# Patient Record
Sex: Male | Born: 1940 | Race: Black or African American | Hispanic: No | Marital: Married | Smoking: Former smoker
Health system: Southern US, Community
[De-identification: ages and names within clinical notes are randomized; demographics above are authoritative.]

## PROBLEM LIST (undated history)

## (undated) DIAGNOSIS — I499 Cardiac arrhythmia, unspecified: Secondary | ICD-10-CM

## (undated) DIAGNOSIS — E119 Type 2 diabetes mellitus without complications: Secondary | ICD-10-CM

## (undated) DIAGNOSIS — K219 Gastro-esophageal reflux disease without esophagitis: Secondary | ICD-10-CM

## (undated) DIAGNOSIS — M1990 Unspecified osteoarthritis, unspecified site: Secondary | ICD-10-CM

## (undated) DIAGNOSIS — R519 Headache, unspecified: Secondary | ICD-10-CM

## (undated) DIAGNOSIS — I10 Essential (primary) hypertension: Secondary | ICD-10-CM

## (undated) DIAGNOSIS — G4730 Sleep apnea, unspecified: Secondary | ICD-10-CM

## (undated) DIAGNOSIS — R51 Headache: Secondary | ICD-10-CM

## (undated) DIAGNOSIS — G893 Neoplasm related pain (acute) (chronic): Secondary | ICD-10-CM

## (undated) HISTORY — DX: Neoplasm related pain (acute) (chronic): G89.3

## (undated) HISTORY — PX: PENILE PROSTHESIS IMPLANT: SHX240

## (undated) HISTORY — PX: TRANSURETHRAL RESECTION OF PROSTATE: SHX73

## (undated) HISTORY — PX: HERNIA REPAIR: SHX51

---

## 1967-06-12 DIAGNOSIS — Z9189 Other specified personal risk factors, not elsewhere classified: Secondary | ICD-10-CM | POA: Insufficient documentation

## 1972-06-11 DIAGNOSIS — N411 Chronic prostatitis: Secondary | ICD-10-CM | POA: Insufficient documentation

## 1977-06-11 DIAGNOSIS — M545 Low back pain, unspecified: Secondary | ICD-10-CM | POA: Insufficient documentation

## 1999-06-12 DIAGNOSIS — N401 Enlarged prostate with lower urinary tract symptoms: Secondary | ICD-10-CM | POA: Insufficient documentation

## 2005-09-14 ENCOUNTER — Emergency Department: Payer: Self-pay | Admitting: Emergency Medicine

## 2012-01-14 DIAGNOSIS — N400 Enlarged prostate without lower urinary tract symptoms: Secondary | ICD-10-CM | POA: Insufficient documentation

## 2012-01-14 DIAGNOSIS — N411 Chronic prostatitis: Secondary | ICD-10-CM | POA: Insufficient documentation

## 2012-11-23 ENCOUNTER — Emergency Department: Payer: Self-pay | Admitting: Emergency Medicine

## 2013-01-15 DIAGNOSIS — N529 Male erectile dysfunction, unspecified: Secondary | ICD-10-CM | POA: Insufficient documentation

## 2014-03-17 DIAGNOSIS — R002 Palpitations: Secondary | ICD-10-CM | POA: Insufficient documentation

## 2014-03-17 DIAGNOSIS — I10 Essential (primary) hypertension: Secondary | ICD-10-CM | POA: Insufficient documentation

## 2014-03-17 DIAGNOSIS — G4730 Sleep apnea, unspecified: Secondary | ICD-10-CM | POA: Insufficient documentation

## 2015-01-26 DIAGNOSIS — R519 Headache, unspecified: Secondary | ICD-10-CM | POA: Insufficient documentation

## 2015-01-26 DIAGNOSIS — R51 Headache: Secondary | ICD-10-CM

## 2015-02-28 ENCOUNTER — Encounter
Admission: RE | Admit: 2015-02-28 | Discharge: 2015-02-28 | Disposition: A | Discharge status: Home / Self Care | Payer: Medicare Other | Source: Ambulatory Visit | Attending: Otolaryngology | Admitting: Otolaryngology

## 2015-02-28 DIAGNOSIS — Z79899 Other long term (current) drug therapy: Secondary | ICD-10-CM | POA: Diagnosis not present

## 2015-02-28 DIAGNOSIS — Z882 Allergy status to sulfonamides status: Secondary | ICD-10-CM | POA: Diagnosis not present

## 2015-02-28 DIAGNOSIS — R011 Cardiac murmur, unspecified: Secondary | ICD-10-CM | POA: Diagnosis not present

## 2015-02-28 DIAGNOSIS — Z7951 Long term (current) use of inhaled steroids: Secondary | ICD-10-CM | POA: Diagnosis not present

## 2015-02-28 DIAGNOSIS — Z87891 Personal history of nicotine dependence: Secondary | ICD-10-CM | POA: Diagnosis not present

## 2015-02-28 DIAGNOSIS — K219 Gastro-esophageal reflux disease without esophagitis: Secondary | ICD-10-CM | POA: Diagnosis not present

## 2015-02-28 DIAGNOSIS — G4730 Sleep apnea, unspecified: Secondary | ICD-10-CM | POA: Diagnosis not present

## 2015-02-28 DIAGNOSIS — Z881 Allergy status to other antibiotic agents status: Secondary | ICD-10-CM | POA: Diagnosis not present

## 2015-02-28 DIAGNOSIS — E119 Type 2 diabetes mellitus without complications: Secondary | ICD-10-CM | POA: Diagnosis not present

## 2015-02-28 DIAGNOSIS — R51 Headache: Secondary | ICD-10-CM | POA: Diagnosis present

## 2015-02-28 DIAGNOSIS — I10 Essential (primary) hypertension: Secondary | ICD-10-CM | POA: Diagnosis not present

## 2015-02-28 DIAGNOSIS — Z880 Allergy status to penicillin: Secondary | ICD-10-CM | POA: Diagnosis not present

## 2015-02-28 DIAGNOSIS — M1990 Unspecified osteoarthritis, unspecified site: Secondary | ICD-10-CM | POA: Diagnosis not present

## 2015-02-28 DIAGNOSIS — M316 Other giant cell arteritis: Secondary | ICD-10-CM | POA: Diagnosis not present

## 2015-02-28 HISTORY — DX: Sleep apnea, unspecified: G47.30

## 2015-02-28 HISTORY — DX: Unspecified osteoarthritis, unspecified site: M19.90

## 2015-02-28 HISTORY — DX: Headache: R51

## 2015-02-28 HISTORY — DX: Essential (primary) hypertension: I10

## 2015-02-28 HISTORY — DX: Gastro-esophageal reflux disease without esophagitis: K21.9

## 2015-02-28 HISTORY — DX: Headache, unspecified: R51.9

## 2015-02-28 HISTORY — DX: Type 2 diabetes mellitus without complications: E11.9

## 2015-02-28 LAB — BASIC METABOLIC PANEL
Anion gap: 8 (ref 5–15)
BUN: 18 mg/dL (ref 6–20)
CO2: 27 mmol/L (ref 22–32)
Calcium: 10.3 mg/dL (ref 8.9–10.3)
Chloride: 103 mmol/L (ref 101–111)
Creatinine, Ser: 1.2 mg/dL (ref 0.61–1.24)
GFR calc Af Amer: >60 mL/min (ref >60)
GFR calc non Af Amer: 59 mL/min — ABNORMAL LOW (ref >60)
Glucose, Bld: 111 mg/dL — ABNORMAL HIGH (ref 65–99)
Potassium: 3.8 mmol/L (ref 3.5–5.1)
Sodium: 138 mmol/L (ref 135–145)

## 2015-02-28 NOTE — Patient Instructions (Signed)
  Your procedure is scheduled on: March 02, 2015 (Wednesday) Report to Day Surgery.[facility]) To find out your arrival time please call [phone] between 1PM - 3PM on March 01, 2015 (Tuesday).  Remember: Instructions that are not followed completely may result in serious medical risk, up to and including death, or upon the discretion of your surgeon and anesthesiologist your surgery may need to be rescheduled.    __x__ 1. Do not eat food or drink liquids after midnight. No gum chewing or hard candies.     ____ 2. No Alcohol for 24 hours before or after surgery.   ____ 3. Bring all medications with you on the day of surgery if instructed.    __x__ 4. Notify your doctor if there is any change in your medical condition     (cold, fever, infections).     Do not wear jewelry, make-up, hairpins, clips or nail polish.  Do not wear lotions, powders, or perfumes. You may wear deodorant.  Do not shave 48 hours prior to surgery. Men may shave face and neck.  Do not bring valuables to the hospital.    [facility] is not responsible for any belongings or valuables.               Contacts, dentures or bridgework may not be worn into surgery.  Leave your suitcase in the car. After surgery it may be brought to your room.  For patients admitted to the hospital, discharge time is determined by your                treatment team.   Patients discharged the day of surgery will not be allowed to drive home.   Please read over the following fact sheets that you were given:     __x__ Take these medicines the morning of surgery with A SIP OF WATER:    1. Omeprazole (Omeprazole at bedtime on 9-20)    2. Gabapentin  ____ Fleet Enema (as directed)   ____ Use CHG Soap as directed  ____ Use inhalers on the day of surgery  _x___ Stop metformin 2 days prior to surgery (STOP METFORMIN ON September 19, TODAY)    ____ Take 1/2 of usual insulin dose the night before surgery and none on the  morning of surgery.   _x___ Stop Coumadin/Plavix/aspirin on (PATIENT STOPPED ASPIRIN ONE WEEK AGO)  _x__ Stop Anti-inflammatories on (TYLENOL OK TO TAKE FOR PAIN)   ____ Stop supplements until after surgery.    _x___ Bring C-Pap to the hospital.

## 2015-03-02 ENCOUNTER — Ambulatory Visit: Payer: Medicare Other | Admitting: Certified Registered Nurse Anesthetist

## 2015-03-02 ENCOUNTER — Encounter: Payer: Self-pay | Admitting: *Deleted

## 2015-03-02 ENCOUNTER — Ambulatory Visit
Admission: RE | Admit: 2015-03-02 | Discharge: 2015-03-02 | Disposition: A | Discharge status: Home / Self Care | Payer: Medicare Other | Source: Ambulatory Visit | Attending: Otolaryngology | Admitting: Otolaryngology

## 2015-03-02 ENCOUNTER — Encounter
Admission: RE | Disposition: A | Discharge status: Home / Self Care | Payer: Self-pay | Source: Ambulatory Visit | Attending: Otolaryngology

## 2015-03-02 DIAGNOSIS — I10 Essential (primary) hypertension: Secondary | ICD-10-CM | POA: Insufficient documentation

## 2015-03-02 DIAGNOSIS — Z880 Allergy status to penicillin: Secondary | ICD-10-CM | POA: Insufficient documentation

## 2015-03-02 DIAGNOSIS — Z87891 Personal history of nicotine dependence: Secondary | ICD-10-CM | POA: Insufficient documentation

## 2015-03-02 DIAGNOSIS — M316 Other giant cell arteritis: Secondary | ICD-10-CM | POA: Insufficient documentation

## 2015-03-02 DIAGNOSIS — Z881 Allergy status to other antibiotic agents status: Secondary | ICD-10-CM | POA: Insufficient documentation

## 2015-03-02 DIAGNOSIS — E119 Type 2 diabetes mellitus without complications: Secondary | ICD-10-CM | POA: Insufficient documentation

## 2015-03-02 DIAGNOSIS — Z7951 Long term (current) use of inhaled steroids: Secondary | ICD-10-CM | POA: Insufficient documentation

## 2015-03-02 DIAGNOSIS — K219 Gastro-esophageal reflux disease without esophagitis: Secondary | ICD-10-CM | POA: Insufficient documentation

## 2015-03-02 DIAGNOSIS — G4730 Sleep apnea, unspecified: Secondary | ICD-10-CM | POA: Insufficient documentation

## 2015-03-02 DIAGNOSIS — Z882 Allergy status to sulfonamides status: Secondary | ICD-10-CM | POA: Insufficient documentation

## 2015-03-02 DIAGNOSIS — M1990 Unspecified osteoarthritis, unspecified site: Secondary | ICD-10-CM | POA: Insufficient documentation

## 2015-03-02 DIAGNOSIS — R011 Cardiac murmur, unspecified: Secondary | ICD-10-CM | POA: Insufficient documentation

## 2015-03-02 DIAGNOSIS — Z79899 Other long term (current) drug therapy: Secondary | ICD-10-CM | POA: Insufficient documentation

## 2015-03-02 HISTORY — PX: ARTERY BIOPSY: SHX891

## 2015-03-02 LAB — GLUCOSE, CAPILLARY: Glucose-Capillary: 111 mg/dL — ABNORMAL HIGH (ref 65–99)

## 2015-03-02 SURGERY — BIOPSY TEMPORAL ARTERY
Anesthesia: General | Laterality: Left | Wound class: Clean

## 2015-03-02 MED ORDER — PROPOFOL INFUSION 10 MG/ML OPTIME
INTRAVENOUS | Status: DC | PRN
  Administered 2015-03-02: 25 ug/kg/min via INTRAVENOUS

## 2015-03-02 MED ORDER — LIDOCAINE HCL (CARDIAC) 20 MG/ML IV SOLN
INTRAVENOUS | Status: DC | PRN
  Administered 2015-03-02: 100 mg via INTRAVENOUS

## 2015-03-02 MED ORDER — FAMOTIDINE 20 MG PO TABS: 20.0000 mg | ORAL_TABLET | Freq: Once | ORAL | Status: DC

## 2015-03-02 MED ORDER — LIDOCAINE-EPINEPHRINE 1 %-1:100000 IJ SOLN
INTRAMUSCULAR | Status: AC
  Filled 2015-03-02: qty 1

## 2015-03-02 MED ORDER — SODIUM CHLORIDE 0.9 % IV SOLN
INTRAVENOUS | Status: DC
  Administered 2015-03-02: 08:00:00 via INTRAVENOUS

## 2015-03-02 MED ORDER — BACITRACIN ZINC 500 UNIT/GM EX OINT
TOPICAL_OINTMENT | CUTANEOUS | Status: AC
  Filled 2015-03-02: qty 28.35

## 2015-03-02 MED ORDER — ONDANSETRON HCL 4 MG/2ML IJ SOLN: 4.0000 mg | Freq: Once | INTRAMUSCULAR | Status: DC | PRN

## 2015-03-02 MED ORDER — BACITRACIN 500 UNIT/GM EX OINT
TOPICAL_OINTMENT | CUTANEOUS | Status: DC | PRN
Start: 2015-03-02 — End: 2015-03-02
  Administered 2015-03-02: 1 via TOPICAL

## 2015-03-02 MED ORDER — FENTANYL CITRATE (PF) 100 MCG/2ML IJ SOLN: 25.0000 ug | INTRAMUSCULAR | Status: DC | PRN

## 2015-03-02 MED ORDER — FENTANYL CITRATE (PF) 100 MCG/2ML IJ SOLN
INTRAMUSCULAR | Status: DC | PRN
  Administered 2015-03-02: 50 ug via INTRAVENOUS

## 2015-03-02 MED ORDER — LACTATED RINGERS IV SOLN: INTRAVENOUS | Status: DC

## 2015-03-02 MED ORDER — LIDOCAINE-EPINEPHRINE 1 %-1:100000 IJ SOLN
INTRAMUSCULAR | Status: DC | PRN
  Administered 2015-03-02: 1 mL

## 2015-03-02 MED ORDER — MIDAZOLAM HCL 2 MG/2ML IJ SOLN
INTRAMUSCULAR | Status: DC | PRN
  Administered 2015-03-02: 2 mg via INTRAVENOUS

## 2015-03-02 SURGICAL SUPPLY — 24 items
BLADE SURG 15 STRL LF DISP TIS (BLADE) ×1 IMPLANT
BLADE SURG 15 STRL SS (BLADE) ×2
CANISTER SUCT 1200ML W/VALVE (MISCELLANEOUS) ×3 IMPLANT
CORD BIP STRL DISP 12FT (MISCELLANEOUS) ×3 IMPLANT
ELECT CAUTERY BLADE TIP 2.5 (TIP)
ELECT CAUTERY NEEDLE TIP 1.0 (MISCELLANEOUS) ×3
ELECTRODE CAUTERY BLDE TIP 2.5 (TIP) IMPLANT
ELECTRODE CAUTERY NEDL TIP 1.0 (MISCELLANEOUS) ×1 IMPLANT
FORCEPS JEWEL BIP 4-3/4 STR (INSTRUMENTS) ×3 IMPLANT
GLOVE BIO SURGEON STRL SZ7.5 (GLOVE) ×15 IMPLANT
GOWN STRL REUS W/ TWL LRG LVL3 (GOWN DISPOSABLE) ×3 IMPLANT
GOWN STRL REUS W/TWL LRG LVL3 (GOWN DISPOSABLE) ×6
LABEL OR SOLS (LABEL) ×3 IMPLANT
NS IRRIG 500ML POUR BTL (IV SOLUTION) ×3 IMPLANT
PACK HEAD/NECK (MISCELLANEOUS) ×3 IMPLANT
PAD GROUND ADULT SPLIT (MISCELLANEOUS) ×3 IMPLANT
SPONGE KITTNER 5P (MISCELLANEOUS) ×3 IMPLANT
SUCTION FRAZIER TIP 10 FR DISP (SUCTIONS) ×3 IMPLANT
SUT PLAIN GUT (SUTURE) IMPLANT
SUT SILK 2 0 (SUTURE) ×2
SUT SILK 2-0 18XBRD TIE 12 (SUTURE) ×1 IMPLANT
SUT VIC AB 5 0 P2 18 (SUTURE) IMPLANT
SUT VICRYL+ 4-0 18IN PS-4 (SUTURE) ×3 IMPLANT
SYR 3ML LL SCALE MARK (SYRINGE) IMPLANT

## 2015-03-02 NOTE — Anesthesia Preprocedure Evaluation (Signed)
Anesthesia Evaluation  Patient identified by MRN, date of birth, ID band Patient awake    Reviewed: Allergy & Precautions, NPO status , Patient's Chart, lab work & pertinent test results  History of Anesthesia Complications Negative for: history of anesthetic complications  Airway Mallampati: II  TM Distance: >3 FB Neck ROM: Full    Dental  (+) Teeth Intact   Pulmonary sleep apnea (uses about 1/2 the night) , former smoker (quit x 6 yrs),           Cardiovascular hypertension, Pt. on medications + Valvular Problems/Murmurs (hx of murmur)      Neuro/Psych    GI/Hepatic Neg liver ROS, GERD  Medicated,  Endo/Other  diabetes, Type 2, Oral Hypoglycemic Agents  Renal/GU negative Renal ROS     Musculoskeletal  (+) Arthritis , Osteoarthritis,    Abdominal   Peds  Hematology negative hematology ROS (+)   Anesthesia Other Findings   Reproductive/Obstetrics                             Anesthesia Physical Anesthesia Plan  ASA: III  Anesthesia Plan: General   Post-op Pain Management:    Induction: Intravenous  Airway Management Planned: Nasal Cannula  Additional Equipment:   Intra-op Plan:   Post-operative Plan:   Informed Consent: I have reviewed the patients History and Physical, chart, labs and discussed the procedure including the risks, benefits and alternatives for the proposed anesthesia with the patient or authorized representative who has indicated his/her understanding and acceptance.     Plan Discussed with:   Anesthesia Plan Comments:         Anesthesia Quick Evaluation

## 2015-03-02 NOTE — H&P (Signed)
History and physical reviewed and will be scanned in later. No change in medical status reported by the patient or family, appears stable for surgery. All questions regarding the procedure answered, and patient (or family if a child) expressed understanding of the procedure.  Elandra Powell S @TODAY@ 

## 2015-03-02 NOTE — Transfer of Care (Signed)
Immediate Anesthesia Transfer of Care Note  Patient: Ricky Riley  Procedure(s) Performed: Procedure(s): BIOPSY TEMPORAL ARTERY (Left)  Patient Location: PACU  Anesthesia Type:MAC  Level of Consciousness: awake, alert  and oriented  Airway & Oxygen Therapy: Patient Spontanous Breathing and Patient connected to face mask oxygen  Post-op Assessment: Report given to RN and Post -op Vital signs reviewed and stable  Post vital signs: Reviewed and stable  Last Vitals:  Filed Vitals:   03/02/15 1011  BP:   Pulse:   Temp: 37.2 C  Resp:    BP 155/71 HR 46 Resp 11 Complications: No apparent anesthesia complications

## 2015-03-02 NOTE — Anesthesia Procedure Notes (Signed)
Procedure Name: MAC Performed by: Demetrius Charity Pre-anesthesia Checklist: Patient identified, Emergency Drugs available, Patient being monitored, Suction available and Timeout performed Oxygen Delivery Method: Simple face mask

## 2015-03-02 NOTE — Anesthesia Postprocedure Evaluation (Signed)
  Anesthesia Post-op Note  Patient: Ricky Riley  Procedure(s) Performed: Procedure(s): BIOPSY TEMPORAL ARTERY (Left)  Anesthesia type:General  Patient location: PACU  Post pain: Pain level controlled  Post assessment: Post-op Vital signs reviewed, Patient's Cardiovascular Status Stable, Respiratory Function Stable, Patent Airway and No signs of Nausea or vomiting  Post vital signs: Reviewed and stable  Last Vitals:  Filed Vitals:   03/02/15 1040  BP:   Pulse:   Temp: 36.3 C  Resp:     Level of consciousness: awake, alert  and patient cooperative  Complications: No apparent anesthesia complications

## 2015-03-02 NOTE — Discharge Instructions (Signed)
AMBULATORY SURGERY  °DISCHARGE INSTRUCTIONS ° ° °1) The drugs that you were given will stay in your system until tomorrow so for the next 24 hours you should not: ° °A) Drive an automobile °B) Make any legal decisions °C) Drink any alcoholic beverage ° ° °2) You may resume regular meals tomorrow.  Today it is better to start with liquids and gradually work up to solid foods. ° °You may eat anything you prefer, but it is better to start with liquids, then soup and crackers, and gradually work up to solid foods. ° ° °3) Please notify your doctor immediately if you have any unusual bleeding, trouble breathing, redness and pain at the surgery site, drainage, fever, or pain not relieved by medication. ° ° ° °4) Additional Instructions: ° ° ° ° ° ° ° °Please contact your physician with any problems or Same Day Surgery at [phone], Monday through Friday 6 am to 4 pm, or [place] [facility] [place] Main number at [phone]. °

## 2015-03-02 NOTE — Op Note (Signed)
03/02/2015  10:03 AM    Ricky Riley  [id number]   Pre-Op Diagnosis:  HEADACHE, DIZZINESS, POSSIBLE TEMPORAL ARTERITIS  Post-op Diagnosis: HEADACHE, DIZZINESS, POSSIBLE TEMPORAL ARTERITIS  Procedure:   Left temporal artery biopsy with intermediate closure (2.5 cm)   Surgeon:  Riley Nearing  Anesthesia:  Local with IV sedation and monitored anesthesia care  EBL:  Minimal  Complications:  None  Findings: A 2 cm segment of thickened temporal artery was harvested  Procedure: After the patient was identified in holding and the procedure was reviewed.  The patient was taken to the operating room and with the patient in a comfortable supine position,   sedation with monitored anesthesia care was induced without difficulty.  A proper time-out was performed, confirming the operative site and procedure.  The left temporal area was prepped and draped in the usual sterile fashion. A Doppler was then used to identify the course of the left temporal artery, and marked with a pen. The skin was then injected with 1% lidocaine with epinephrine 1 200,000. A 15 blade was used to incise the skin and the incision carried down through to the subcutaneous fatty tissues. The temporal artery was then dissected out with hemostat. Scissors were used to divide soft tissue attachments. A segment of at least 2 cm of the temporal artery was dissected out and clamped with a hemostat proximally and distally. The artery was divided with a 15 blade and dissected away from the underlying temporalis muscle and sent as a specimen. The cut ends of the artery were then suture-ligated with 2-0 silk suture. There was some minimal oozing in the wound which was addressed with the bipolar cautery. The wound was then closed with 4-0 Vicryl suture followed by 5-0 fast absorbing gut suture for the skin. Bacitracin ointment was applied.  The patient was then returned to the anesthesiologist in good condition for awakening. The  patient was awakened and taken to the recovery room in good condition.   Disposition:   PACU then discharge home.  Plan: Discharge home. Ice to areas needed. Patient may take Tylenol as needed for pain. Bacitracin ointment to wound twice daily for 7 days.  Riley Nearing 03/02/2015 10:03 AM

## 2015-03-03 LAB — SURGICAL PATHOLOGY

## 2015-11-03 ENCOUNTER — Other Ambulatory Visit
Admission: RE | Admit: 2015-11-03 | Discharge: 2015-11-03 | Disposition: A | Discharge status: Home / Self Care | Payer: Medicare Other | Source: Ambulatory Visit | Attending: Rheumatology | Admitting: Rheumatology

## 2015-11-03 DIAGNOSIS — M1711 Unilateral primary osteoarthritis, right knee: Secondary | ICD-10-CM | POA: Insufficient documentation

## 2015-11-03 LAB — SYNOVIAL CELL COUNT + DIFF, W/ CRYSTALS
Crystals, Fluid: NONE SEEN
Lymphocytes-Synovial Fld: 39 %
Monocyte-Macrophage-Synovial Fluid: 57 %
Neutrophil, Synovial: 4 %
WBC, Synovial: 642 /mm3 — ABNORMAL HIGH (ref 0–200)

## 2016-01-23 DIAGNOSIS — Z7952 Long term (current) use of systemic steroids: Secondary | ICD-10-CM | POA: Insufficient documentation

## 2016-01-23 DIAGNOSIS — R05 Cough: Secondary | ICD-10-CM | POA: Insufficient documentation

## 2016-01-23 DIAGNOSIS — R053 Chronic cough: Secondary | ICD-10-CM | POA: Insufficient documentation

## 2016-03-12 ENCOUNTER — Other Ambulatory Visit: Payer: Self-pay | Admitting: Infectious Diseases

## 2016-03-12 DIAGNOSIS — R05 Cough: Secondary | ICD-10-CM

## 2016-03-12 DIAGNOSIS — Z7952 Long term (current) use of systemic steroids: Secondary | ICD-10-CM

## 2016-03-12 DIAGNOSIS — R059 Cough, unspecified: Secondary | ICD-10-CM

## 2016-03-20 ENCOUNTER — Ambulatory Visit: Payer: Medicare Other

## 2016-03-21 ENCOUNTER — Ambulatory Visit
Admission: RE | Admit: 2016-03-21 | Discharge: 2016-03-21 | Disposition: A | Discharge status: Home / Self Care | Payer: Medicare Other | Source: Ambulatory Visit | Attending: Infectious Diseases | Admitting: Infectious Diseases

## 2016-03-21 DIAGNOSIS — R918 Other nonspecific abnormal finding of lung field: Secondary | ICD-10-CM | POA: Insufficient documentation

## 2016-03-21 DIAGNOSIS — R938 Abnormal findings on diagnostic imaging of other specified body structures: Secondary | ICD-10-CM | POA: Diagnosis not present

## 2016-03-21 DIAGNOSIS — R9389 Abnormal findings on diagnostic imaging of other specified body structures: Secondary | ICD-10-CM | POA: Insufficient documentation

## 2016-03-21 DIAGNOSIS — R05 Cough: Secondary | ICD-10-CM | POA: Diagnosis present

## 2016-03-21 DIAGNOSIS — I700 Atherosclerosis of aorta: Secondary | ICD-10-CM | POA: Diagnosis not present

## 2016-03-21 DIAGNOSIS — E041 Nontoxic single thyroid nodule: Secondary | ICD-10-CM | POA: Diagnosis not present

## 2016-03-21 DIAGNOSIS — Z7952 Long term (current) use of systemic steroids: Secondary | ICD-10-CM | POA: Diagnosis present

## 2016-03-21 DIAGNOSIS — R059 Cough, unspecified: Secondary | ICD-10-CM

## 2016-03-29 ENCOUNTER — Other Ambulatory Visit: Payer: Self-pay | Admitting: Otolaryngology

## 2016-03-29 DIAGNOSIS — E079 Disorder of thyroid, unspecified: Secondary | ICD-10-CM

## 2016-03-30 DIAGNOSIS — M1711 Unilateral primary osteoarthritis, right knee: Secondary | ICD-10-CM | POA: Insufficient documentation

## 2016-04-05 ENCOUNTER — Ambulatory Visit
Admission: RE | Admit: 2016-04-05 | Discharge: 2016-04-05 | Disposition: A | Discharge status: Home / Self Care | Payer: Medicare Other | Source: Ambulatory Visit | Attending: Otolaryngology | Admitting: Otolaryngology

## 2016-04-05 DIAGNOSIS — E041 Nontoxic single thyroid nodule: Secondary | ICD-10-CM | POA: Diagnosis not present

## 2016-04-05 DIAGNOSIS — E079 Disorder of thyroid, unspecified: Secondary | ICD-10-CM

## 2016-04-05 NOTE — Progress Notes (Signed)
Patient tolerated procedure well. Bandage applied to biopsy site. Patient VS WNL. Patient ambulated with this RN to medical mall entrance for discharge to home.

## 2016-04-06 LAB — CYTOLOGY - NON PAP

## 2016-04-20 DIAGNOSIS — E041 Nontoxic single thyroid nodule: Secondary | ICD-10-CM | POA: Insufficient documentation

## 2016-08-20 NOTE — Addendum Note (Signed)
Encounter addended by: Vernie Murders, RT on: 08/20/2016 11:03 AM<BR>    Actions taken: Imaging Exam ended, Charge Capture section accepted

## 2016-12-31 DIAGNOSIS — R252 Cramp and spasm: Secondary | ICD-10-CM | POA: Insufficient documentation

## 2017-01-11 DIAGNOSIS — M25511 Pain in right shoulder: Secondary | ICD-10-CM | POA: Insufficient documentation

## 2017-02-05 DIAGNOSIS — R0789 Other chest pain: Secondary | ICD-10-CM | POA: Insufficient documentation

## 2017-03-22 DIAGNOSIS — R0602 Shortness of breath: Secondary | ICD-10-CM | POA: Insufficient documentation

## 2017-04-05 ENCOUNTER — Emergency Department
Admission: EM | Admit: 2017-04-05 | Discharge: 2017-04-05 | Disposition: A | Discharge status: Home / Self Care | Payer: Medicare Other | Attending: Emergency Medicine | Admitting: Emergency Medicine

## 2017-04-05 ENCOUNTER — Emergency Department: Payer: Medicare Other

## 2017-04-05 DIAGNOSIS — Y929 Unspecified place or not applicable: Secondary | ICD-10-CM | POA: Insufficient documentation

## 2017-04-05 DIAGNOSIS — Z203 Contact with and (suspected) exposure to rabies: Secondary | ICD-10-CM | POA: Insufficient documentation

## 2017-04-05 DIAGNOSIS — W540XXA Bitten by dog, initial encounter: Secondary | ICD-10-CM | POA: Diagnosis not present

## 2017-04-05 DIAGNOSIS — Y999 Unspecified external cause status: Secondary | ICD-10-CM | POA: Insufficient documentation

## 2017-04-05 DIAGNOSIS — Z7984 Long term (current) use of oral hypoglycemic drugs: Secondary | ICD-10-CM | POA: Insufficient documentation

## 2017-04-05 DIAGNOSIS — Z87891 Personal history of nicotine dependence: Secondary | ICD-10-CM | POA: Insufficient documentation

## 2017-04-05 DIAGNOSIS — Y939 Activity, unspecified: Secondary | ICD-10-CM | POA: Insufficient documentation

## 2017-04-05 DIAGNOSIS — E119 Type 2 diabetes mellitus without complications: Secondary | ICD-10-CM | POA: Diagnosis not present

## 2017-04-05 DIAGNOSIS — Z79899 Other long term (current) drug therapy: Secondary | ICD-10-CM | POA: Diagnosis not present

## 2017-04-05 DIAGNOSIS — Z2914 Encounter for prophylactic rabies immune globin: Secondary | ICD-10-CM | POA: Diagnosis not present

## 2017-04-05 DIAGNOSIS — Z23 Encounter for immunization: Secondary | ICD-10-CM | POA: Diagnosis not present

## 2017-04-05 DIAGNOSIS — S6992XA Unspecified injury of left wrist, hand and finger(s), initial encounter: Secondary | ICD-10-CM | POA: Diagnosis present

## 2017-04-05 DIAGNOSIS — S61452A Open bite of left hand, initial encounter: Secondary | ICD-10-CM | POA: Insufficient documentation

## 2017-04-05 DIAGNOSIS — I10 Essential (primary) hypertension: Secondary | ICD-10-CM | POA: Diagnosis not present

## 2017-04-05 DIAGNOSIS — Z7982 Long term (current) use of aspirin: Secondary | ICD-10-CM | POA: Insufficient documentation

## 2017-04-05 MED ORDER — DOXYCYCLINE HYCLATE 100 MG PO TABS: 100.0000 mg | ORAL_TABLET | Freq: Two times a day (BID) | ORAL | 0 refills | Status: DC

## 2017-04-05 MED ORDER — RABIES IMMUNE GLOBULIN 150 UNIT/ML IM INJ
20.0000 [IU]/kg | INJECTION | Freq: Once | INTRAMUSCULAR | Status: AC
  Administered 2017-04-05: 1800 [IU] via INTRAMUSCULAR
  Filled 2017-04-05: qty 12

## 2017-04-05 MED ORDER — RABIES VACCINE, PCEC IM SUSR
1.0000 mL | Freq: Once | INTRAMUSCULAR | Status: AC
  Administered 2017-04-05: 1 mL via INTRAMUSCULAR
  Filled 2017-04-05: qty 1

## 2017-04-05 MED ORDER — DOXYCYCLINE HYCLATE 100 MG PO TABS
100.0000 mg | ORAL_TABLET | Freq: Once | ORAL | Status: AC
  Administered 2017-04-05: 100 mg via ORAL
  Filled 2017-04-05: qty 1

## 2017-04-05 MED ORDER — TETANUS-DIPHTH-ACELL PERTUSSIS 5-2.5-18.5 LF-MCG/0.5 IM SUSP
0.5000 mL | Freq: Once | INTRAMUSCULAR | Status: AC
  Administered 2017-04-05: 0.5 mL via INTRAMUSCULAR
  Filled 2017-04-05: qty 0.5

## 2017-04-05 NOTE — ED Provider Notes (Signed)
[facility] Emergency Department Provider Note  ____________________________________________  Time seen: Approximately 9:59 PM  I have reviewed the triage vital signs and the nursing notes.   HISTORY  Chief Complaint Animal Bite    HPI Ricky Riley is a 76 y.o. male who presents the emergency with a complaint of dog bite to his left hand yesterday.  Patient reports that he has been minding his own dog, yesterday evening.  Initially, patient cleaned out the wound with hydrogen peroxide and soap and water.  Patient reports that injury was minimal.  Today he became concerned and sought care to discuss options of treatment.  Patient reports that the dog is kept in a cage on the back lot and was acting its normal "ornery" self.  Patient denies any symptoms in the interval consistent with rabies.  Patient denies any gross erythema or edema surrounding dog bite.  No other injury or complaint at this time.  Patient is allergic to Cipro, penicillins, sulfa antibiotics.  Patient has a history of diabetes, hypertension, sleep apnea, osteoarthritis, benign prostatic hypertrophy.  She denies any complaints with chronic medical complaints at this time.   Past Medical History:  Diagnosis Date  . Arthritis   . Diabetes mellitus without complication ([place])   . GERD (gastroesophageal reflux disease)   . Headache   . Hypertension   . Sleep apnea     There are no active problems to display for this patient.   Past Surgical History:  Procedure Laterality Date  . ARTERY BIOPSY Left 03/02/2015   Procedure: BIOPSY TEMPORAL ARTERY;  Surgeon: Clyde Canterbury, MD;  Location: [facility] ORS;  Service: ENT;  Laterality: Left;  . TRANSURETHRAL RESECTION OF PROSTATE      Prior to Admission medications   Medication Sig Start Date End Date Taking? Authorizing Provider  aspirin 81 MG tablet Take 81 mg by mouth daily.    [provider]  Azelastine HCl 0.15 % SOLN Place 1-2 sprays  into the nose 2 (two) times daily.    [provider]  cholecalciferol (VITAMIN D) 1000 UNITS tablet Take 3,000 Units by mouth daily.    [provider]  doxycycline (VIBRA-TABS) 100 MG tablet Take 1 tablet (100 mg total) by mouth 2 (two) times daily. 04/05/17   Aqib Lough, Charline Bills, PA-C  finasteride (PROSCAR) 5 MG tablet Take 5 mg by mouth daily.    [provider]  gabapentin (NEURONTIN) 100 MG capsule Take 200 mg by mouth 2 (two) times daily.    [provider]  hydrochlorothiazide (HYDRODIURIL) 12.5 MG tablet Take 12.5 mg by mouth daily.    [provider]  metFORMIN (GLUCOPHAGE) 500 MG tablet Take 500 mg by mouth daily with breakfast.    [provider]  omeprazole (PRILOSEC) 20 MG capsule Take 20 mg by mouth daily.    [provider]  terazosin (HYTRIN) 10 MG capsule Take 10 mg by mouth at bedtime.    [provider]    Allergies Ciprofloxacin; Penicillins; and Sulfa antibiotics  No family history on file.  Social History Social History  Substance Use Topics  . Smoking status: Former Smoker    Packs/day: 1.00    Types: Cigarettes    Quit date: 06/05/2009  . Smokeless tobacco: Never Used  . Alcohol use No     Review of Systems  Constitutional: No fever/chills Eyes: No visual changes. No discharge ENT: No upper respiratory complaints. Cardiovascular: no chest pain. Respiratory: no cough. No SOB. Gastrointestinal:  No abdominal pain.  No nausea, no vomiting.   Musculoskeletal: Negative for musculoskeletal pain. Skin: Negative for rash, abrasions, lacerations, ecchymosis.  Positive for dog bite to left thumb Neurological: Negative for headaches, focal weakness or numbness. 10-point ROS otherwise negative.  ____________________________________________   PHYSICAL EXAM:  VITAL SIGNS: ED Triage Vitals  Enc Vitals Group     BP 04/05/17 2133 (!) 187/89     Pulse Rate 04/05/17 2133 63     Resp --       Temp 04/05/17 2133 98 F (36.7 C)     Temp Source 04/05/17 2133 Oral     SpO2 04/05/17 2133 99 %     Weight 04/05/17 2130 202 lb (91.6 kg)     Height 04/05/17 2130 6\' 3"  (1.905 m)     Head Circumference --      Peak Flow --      Pain Score 04/05/17 2129 3     Pain Loc --      Pain Edu? --      Excl. in [place]? --      Constitutional: Alert and oriented. Well appearing and in no acute distress. Eyes: Conjunctivae are normal. PERRL. EOMI. Head: Atraumatic. Neck: No stridor.    Cardiovascular: Normal rate, regular rhythm. Normal S1 and S2.  Good peripheral circulation. Respiratory: Normal respiratory effort without tachypnea or retractions. Lungs CTAB. Good air entry to the bases with no decreased or absent breath sounds. Musculoskeletal: Full range of motion to all extremities. No gross deformities appreciated. Neurologic:  Normal speech and language. No gross focal neurologic deficits are appreciated.  Skin:  Skin is warm, dry and intact. No rash noted.  2 superficial puncture wounds noted to the dorsal aspect of the left thumb over the MCP joint.  No bleeding.  No foreign body.  No gross surrounding erythema consistent with cellulitis.  Patient has full range of motion to the digit.  Sensation and cap refill intact.  Nontender to palpation over the MCP joint of the thumb. Psychiatric: Mood and affect are normal. Speech and behavior are normal. Patient exhibits appropriate insight and judgement.   ____________________________________________   LABS (all labs ordered are listed, but only abnormal results are displayed)  Labs Reviewed - No data to display ____________________________________________  EKG   ____________________________________________  RADIOLOGY   No results found.  ____________________________________________    PROCEDURES  Procedure(s) performed:    Procedures    Medications  rabies vaccine (RABAVERT) injection 1 mL (not administered)  rabies  immune globulin (HYPERAB/KEDRAB) injection 1,800 Units (not administered)  doxycycline (VIBRA-TABS) tablet 100 mg (100 mg Oral Given 04/05/17 2221)  Tdap (BOOSTRIX) injection 0.5 mL (0.5 mLs Intramuscular Given 04/05/17 2222)     ____________________________________________   INITIAL IMPRESSION / ASSESSMENT AND PLAN / ED COURSE  Pertinent labs & imaging results that were available during my care of the patient were reviewed by me and considered in my medical decision making (see chart for details).  Review of the [place] CSRS was performed in accordance of the [place] prior to dispensing any controlled drugs.     Patient's diagnosis is consistent with dog bite to left thumb.  Patient presented concerned about his dog bite.  At this time, no signs of infection.  Wounds are superficial but are cleansed in the emergency department with sure cleanse.  No closure is needed or attempted.  Patient is requesting rabies vaccine series.. Patient will be discharged home with prescriptions for antibiotics prophylactically.  He will return  on days 3, 7, 14 for further rabies shots.. Patient is to follow up with care as needed or otherwise directed. Patient is given ED precautions to return to the ED for any worsening or new symptoms.     ____________________________________________  FINAL CLINICAL IMPRESSION(S) / ED DIAGNOSES  Final diagnoses:  Dog bite, initial encounter      NEW MEDICATIONS STARTED DURING THIS VISIT:  New Prescriptions   DOXYCYCLINE (VIBRA-TABS) 100 MG TABLET    Take 1 tablet (100 mg total) by mouth 2 (two) times daily.        This chart was dictated using voice recognition software/Dragon. Despite best efforts to proofread, errors can occur which can change the meaning. Any change was purely unintentional.    Darletta Moll, PA-C 04/05/17 2244    Nena Polio, MD 04/06/17 0005

## 2017-04-05 NOTE — ED Triage Notes (Signed)
Patient c/o dog bite to left hand, proximal to 1st digit.  Patient reports he was bitten by his dog, who is still a puppy and has not had rabies shot.  Patient reports dog lives outdoors in a cage that is off of the ground.

## 2017-04-05 NOTE — ED Notes (Signed)
Patient declined discharge vital signs. 

## 2017-04-08 ENCOUNTER — Emergency Department
Admission: EM | Admit: 2017-04-08 | Discharge: 2017-04-08 | Disposition: A | Discharge status: Home / Self Care | Payer: Medicare Other | Attending: Emergency Medicine | Admitting: Emergency Medicine

## 2017-04-08 ENCOUNTER — Encounter: Payer: Self-pay | Admitting: Emergency Medicine

## 2017-04-08 DIAGNOSIS — W540XXD Bitten by dog, subsequent encounter: Secondary | ICD-10-CM | POA: Diagnosis not present

## 2017-04-08 DIAGNOSIS — I10 Essential (primary) hypertension: Secondary | ICD-10-CM | POA: Insufficient documentation

## 2017-04-08 DIAGNOSIS — S61452D Open bite of left hand, subsequent encounter: Secondary | ICD-10-CM | POA: Insufficient documentation

## 2017-04-08 DIAGNOSIS — Z23 Encounter for immunization: Secondary | ICD-10-CM | POA: Insufficient documentation

## 2017-04-08 DIAGNOSIS — Z87891 Personal history of nicotine dependence: Secondary | ICD-10-CM | POA: Diagnosis not present

## 2017-04-08 DIAGNOSIS — E119 Type 2 diabetes mellitus without complications: Secondary | ICD-10-CM | POA: Insufficient documentation

## 2017-04-08 DIAGNOSIS — Z203 Contact with and (suspected) exposure to rabies: Secondary | ICD-10-CM | POA: Diagnosis not present

## 2017-04-08 MED ORDER — RABIES VACCINE, PCEC IM SUSR
1.0000 mL | Freq: Once | INTRAMUSCULAR | Status: AC
  Administered 2017-04-08: 1 mL via INTRAMUSCULAR
  Filled 2017-04-08: qty 1

## 2017-04-08 NOTE — ED Notes (Addendum)
Pt here for second rabies injection.  Pt states he was bit by his own dog.  Reports he is unsure if dog had rabies.  Pt states dog has not been checked and is still living with dog at home and has not quarantined

## 2017-04-08 NOTE — ED Triage Notes (Signed)
Pt here for rabies injection. 

## 2017-04-08 NOTE — ED Provider Notes (Signed)
[facility] Emergency Department Provider Note   First MD Initiated Contact with Patient 04/08/17 0915     (approximate)  I have reviewed the triage vital signs and the nursing notes.   HISTORY  Chief Complaint Rabies Injection    HPI Ricky Riley is a 76 y.o. male with blow list of chronic medical conditions presents to the emergency department for final rabies vaccinesecondary to a dog bite which she sustained to the left hand on 04/06/2017. Patient denies any symptoms no complaints or concerns.   Past Medical History:  Diagnosis Date  . Arthritis   . Diabetes mellitus without complication ([place])   . GERD (gastroesophageal reflux disease)   . Headache   . Hypertension   . Sleep apnea     There are no active problems to display for this patient.   Past Surgical History:  Procedure Laterality Date  . ARTERY BIOPSY Left 03/02/2015   Procedure: BIOPSY TEMPORAL ARTERY;  Surgeon: Clyde Canterbury, MD;  Location: [facility] ORS;  Service: ENT;  Laterality: Left;  . TRANSURETHRAL RESECTION OF PROSTATE      Prior to Admission medications   Medication Sig Start Date End Date Taking? Authorizing Provider  aspirin 81 MG tablet Take 81 mg by mouth daily.    [provider]  Azelastine HCl 0.15 % SOLN Place 1-2 sprays into the nose 2 (two) times daily.    [provider]  cholecalciferol (VITAMIN D) 1000 UNITS tablet Take 3,000 Units by mouth daily.    [provider]  doxycycline (VIBRA-TABS) 100 MG tablet Take 1 tablet (100 mg total) by mouth 2 (two) times daily. 04/05/17   Cuthriell, Charline Bills, PA-C  finasteride (PROSCAR) 5 MG tablet Take 5 mg by mouth daily.    [provider]  gabapentin (NEURONTIN) 100 MG capsule Take 200 mg by mouth 2 (two) times daily.    [provider]  hydrochlorothiazide (HYDRODIURIL) 12.5 MG tablet Take 12.5 mg by mouth daily.    [provider]  metFORMIN (GLUCOPHAGE) 500 MG  tablet Take 500 mg by mouth daily with breakfast.    [provider]  omeprazole (PRILOSEC) 20 MG capsule Take 20 mg by mouth daily.    [provider]  terazosin (HYTRIN) 10 MG capsule Take 10 mg by mouth at bedtime.    [provider]    Allergies Ciprofloxacin; Penicillins; and Sulfa antibiotics  No family history on file.  Social History Social History  Substance Use Topics  . Smoking status: Former Smoker    Packs/day: 1.00    Types: Cigarettes    Quit date: 06/05/2009  . Smokeless tobacco: Never Used  . Alcohol use No    Review of Systems Constitutional: No fever/chills Eyes: No visual changes. ENT: No sore throat. Cardiovascular: Denies chest pain. Respiratory: Denies shortness of breath. Gastrointestinal: No abdominal pain.  No nausea, no vomiting.  No diarrhea.  No constipation. Genitourinary: Negative for dysuria. Musculoskeletal: Negative for neck pain.  Negative for back pain. Integumentary: Negative for rash. Neurological: Negative for headaches, focal weakness or numbness.   ____________________________________________   PHYSICAL EXAM:  VITAL SIGNS: ED Triage Vitals  Enc Vitals Group     BP 04/08/17 0902 (!) 168/83     Pulse Rate 04/08/17 0900 63     Resp --      Temp 04/08/17 0900 98.2 F (36.8 C)     Temp Source 04/08/17 0900 Oral     SpO2 04/08/17 0900 100 %  Weight --      Height --      Head Circumference --      Peak Flow --      Pain Score --      Pain Loc --      Pain Edu? --      Excl. in [place]? --    Constitutional: Alert and oriented. Well appearing and in no acute distress. Eyes: Conjunctivae are normal.  Cardiovascular: Normal rate, regular rhythm. Good peripheral circulation. Grossly normal heart sounds. Respiratory: Normal respiratory effort.  No retractions. Lungs CTAB. Gastrointestinal: Soft and nontender. No distention.  Musculoskeletal: No lower extremity tenderness nor edema. No gross  deformities of extremities. Neurologic:  Normal speech and language. No gross focal neurologic deficits are appreciated.  Skin:  Skin is warm, dry and intact. No rash noted. Psychiatric: Mood and affect are normal. Speech and behavior are normal.  ____________________________________________   LABS (all labs ordered are listed, but only abnormal results are displayed)  Labs Reviewed - No data to display ____________________________________________    Procedures   ____________________________________________   INITIAL IMPRESSION / ASSESSMENT AND PLAN / ED COURSE  As part of my medical decision making, I reviewed the following data within the electronic MEDICAL RECORD [id number] year old male presenting for rabies vaccine injection. Patient has no complaints or concerns at this time. RabAvert vaccine administered ____________________________________________  FINAL CLINICAL IMPRESSION(S) / ED DIAGNOSES  Final diagnoses:  Encounter for repeat administration of rabies vaccination     MEDICATIONS GIVEN DURING THIS VISIT:  Medications  rabies vaccine (RABAVERT) injection 1 mL (not administered)     NEW OUTPATIENT MEDICATIONS STARTED DURING THIS VISIT:  New Prescriptions   No medications on file    Modified Medications   No medications on file    Discontinued Medications   No medications on file     Note:  This document was prepared using Dragon voice recognition software and may include unintentional dictation errors.    Gregor Hams, MD 04/10/17 [phone]

## 2017-04-12 ENCOUNTER — Other Ambulatory Visit: Payer: Self-pay | Admitting: Infectious Diseases

## 2017-04-12 DIAGNOSIS — R079 Chest pain, unspecified: Secondary | ICD-10-CM

## 2017-04-15 ENCOUNTER — Emergency Department
Admission: EM | Admit: 2017-04-15 | Discharge: 2017-04-15 | Disposition: A | Discharge status: Home / Self Care | Payer: Medicare Other | Attending: Emergency Medicine | Admitting: Emergency Medicine

## 2017-04-15 ENCOUNTER — Other Ambulatory Visit: Payer: Self-pay

## 2017-04-15 DIAGNOSIS — Z23 Encounter for immunization: Secondary | ICD-10-CM | POA: Diagnosis present

## 2017-04-15 DIAGNOSIS — Z203 Contact with and (suspected) exposure to rabies: Secondary | ICD-10-CM | POA: Insufficient documentation

## 2017-04-15 MED ORDER — RABIES VACCINE, PCEC IM SUSR
1.0000 mL | Freq: Once | INTRAMUSCULAR | Status: AC
  Administered 2017-04-15: 1 mL via INTRAMUSCULAR
  Filled 2017-04-15: qty 1

## 2017-04-15 NOTE — ED Triage Notes (Signed)
Pt is here for his last rabies vaccination, pt denies any other complaints

## 2017-04-15 NOTE — ED Provider Notes (Signed)
[facility] Emergency Department Provider Note   ____________________________________________   First MD Initiated Contact with Patient 04/15/17 [phone]     (approximate)  I have reviewed the triage vital signs and the nursing notes.   HISTORY  Chief Complaint Rabies Injection    HPI Ricky Riley is a 76 y.o. male patient here for last injection is a rabies series. Patient states no complaints.   Past Medical History:  Diagnosis Date  . Arthritis   . Diabetes mellitus without complication ([place])   . GERD (gastroesophageal reflux disease)   . Headache   . Hypertension   . Sleep apnea     There are no active problems to display for this patient.   Past Surgical History:  Procedure Laterality Date  . TRANSURETHRAL RESECTION OF PROSTATE      Prior to Admission medications   Medication Sig Start Date End Date Taking? Authorizing Provider  aspirin 81 MG tablet Take 81 mg by mouth daily.    [provider]  Azelastine HCl 0.15 % SOLN Place 1-2 sprays into the nose 2 (two) times daily.    [provider]  cholecalciferol (VITAMIN D) 1000 UNITS tablet Take 3,000 Units by mouth daily.    [provider]  doxycycline (VIBRA-TABS) 100 MG tablet Take 1 tablet (100 mg total) by mouth 2 (two) times daily. 04/05/17   Cuthriell, Charline Bills, PA-C  finasteride (PROSCAR) 5 MG tablet Take 5 mg by mouth daily.    [provider]  gabapentin (NEURONTIN) 100 MG capsule Take 200 mg by mouth 2 (two) times daily.    [provider]  hydrochlorothiazide (HYDRODIURIL) 12.5 MG tablet Take 12.5 mg by mouth daily.    [provider]  metFORMIN (GLUCOPHAGE) 500 MG tablet Take 500 mg by mouth daily with breakfast.    [provider]  omeprazole (PRILOSEC) 20 MG capsule Take 20 mg by mouth daily.    [provider]  terazosin (HYTRIN) 10 MG capsule Take 10 mg by mouth at bedtime.    [provider]    Allergies Ciprofloxacin; Penicillins; and Sulfa antibiotics  No family history on file.  Social History Social History   Tobacco Use  . Smoking status: Former Smoker    Packs/day: 1.00    Types: Cigarettes    Last attempt to quit: 06/05/2009    Years since quitting: 7.8  . Smokeless tobacco: Never Used  Substance Use Topics  . Alcohol use: No  . Drug use: No    Review of Systems Constitutional: No fever/chills Eyes: No visual changes. ENT: No sore throat. Cardiovascular: Denies chest pain. Respiratory: Denies shortness of breath. Gastrointestinal: No abdominal pain.  No nausea, no vomiting.  No diarrhea.  No constipation. Genitourinary: Negative for dysuria. Musculoskeletal: Negative for back pain. Skin: Negative for rash. Neurological: Negative for headaches, focal weakness or numbness. Endocrine:Diabetes and hypertension Allergic/Immunilogical: See medication list ____________________________________________   PHYSICAL EXAM:  VITAL SIGNS: ED Triage Vitals  Enc Vitals Group     BP 04/15/17 0826 138/88     Pulse Rate 04/15/17 0826 65     Resp 04/15/17 0826 18     Temp 04/15/17 0826 (!) 97.4 F (36.3 C)     Temp Source 04/15/17 0826 Oral     SpO2 04/15/17 0826 100 %     Weight 04/15/17 0825 202 lb (91.6 kg)     Height 04/15/17 0825 6\' 3"  (1.905 m)     Head Circumference --  Peak Flow --      Pain Score --      Pain Loc --      Pain Edu? --      Excl. in [place]? --    Constitutional: Alert and oriented. Well appearing and in no acute distress. Cardiovascular: Normal rate, regular rhythm. Grossly normal heart sounds.  Good peripheral circulation. Respiratory: Normal respiratory effort.  No retractions. Lungs CTAB. Neurologic:  Normal speech and language. No gross focal neurologic deficits are appreciated. No gait instability. Skin:  Skin is warm, dry and intact. No rash noted. Psychiatric: Mood and affect are normal. Speech and behavior  are normal.  ____________________________________________   LABS (all labs ordered are listed, but only abnormal results are displayed)  Labs Reviewed - No data to display ____________________________________________  EKG   ____________________________________________  RADIOLOGY  No results found.  ____________________________________________   PROCEDURES  Procedure(s) performed: None  Procedures  Critical Care performed: No  ____________________________________________   INITIAL IMPRESSION / ASSESSMENT AND PLAN / ED COURSE  As part of my medical decision making, I reviewed the following data within the [place]    Patient today for last injection is series of rabies prophylaxis secondary to dog bite.      ____________________________________________   FINAL CLINICAL IMPRESSION(S) / ED DIAGNOSES  Final diagnoses:  Contact with and (suspected) exposure to rabies      Note:  This document was prepared using Dragon voice recognition software and may include unintentional dictation errors.    Sable Feil, PA-C 04/15/17 [phone]    Earleen Newport, MD 04/15/17 [phone]

## 2017-04-29 ENCOUNTER — Ambulatory Visit
Admission: RE | Admit: 2017-04-29 | Discharge: 2017-04-29 | Disposition: A | Discharge status: Home / Self Care | Payer: Medicare Other | Source: Ambulatory Visit | Attending: Infectious Diseases | Admitting: Infectious Diseases

## 2017-04-29 DIAGNOSIS — E041 Nontoxic single thyroid nodule: Secondary | ICD-10-CM | POA: Insufficient documentation

## 2017-04-29 DIAGNOSIS — R9389 Abnormal findings on diagnostic imaging of other specified body structures: Secondary | ICD-10-CM | POA: Insufficient documentation

## 2017-04-29 DIAGNOSIS — R079 Chest pain, unspecified: Secondary | ICD-10-CM | POA: Diagnosis present

## 2017-04-29 DIAGNOSIS — R918 Other nonspecific abnormal finding of lung field: Secondary | ICD-10-CM | POA: Insufficient documentation

## 2017-04-29 LAB — POCT I-STAT CREATININE: Creatinine, Ser: 1.4 mg/dL — ABNORMAL HIGH (ref 0.61–1.24)

## 2017-04-29 MED ORDER — IOPAMIDOL (ISOVUE-300) INJECTION 61%
75.0000 mL | Freq: Once | INTRAVENOUS | Status: AC | PRN
  Administered 2017-04-29: 75 mL via INTRAVENOUS

## 2017-04-30 DIAGNOSIS — S2222XG Fracture of body of sternum, subsequent encounter for fracture with delayed healing: Secondary | ICD-10-CM | POA: Insufficient documentation

## 2017-05-01 ENCOUNTER — Other Ambulatory Visit: Payer: Self-pay | Admitting: Infectious Diseases

## 2017-05-01 DIAGNOSIS — S2222XD Fracture of body of sternum, subsequent encounter for fracture with routine healing: Secondary | ICD-10-CM

## 2017-05-10 ENCOUNTER — Ambulatory Visit
Admission: RE | Admit: 2017-05-10 | Discharge: 2017-05-10 | Disposition: A | Discharge status: Home / Self Care | Payer: Medicare Other | Source: Ambulatory Visit | Attending: Infectious Diseases | Admitting: Infectious Diseases

## 2017-05-10 DIAGNOSIS — S2222XD Fracture of body of sternum, subsequent encounter for fracture with routine healing: Secondary | ICD-10-CM | POA: Diagnosis not present

## 2017-05-10 DIAGNOSIS — X58XXXD Exposure to other specified factors, subsequent encounter: Secondary | ICD-10-CM | POA: Insufficient documentation

## 2017-05-10 MED ORDER — GADOBENATE DIMEGLUMINE 529 MG/ML IV SOLN
20.0000 mL | Freq: Once | INTRAVENOUS | Status: AC | PRN
  Administered 2017-05-10: 19 mL via INTRAVENOUS

## 2017-06-13 ENCOUNTER — Other Ambulatory Visit: Payer: Self-pay

## 2017-06-14 ENCOUNTER — Ambulatory Visit (INDEPENDENT_AMBULATORY_CARE_PROVIDER_SITE_OTHER): Payer: Medicare Other | Admitting: Cardiothoracic Surgery

## 2017-06-14 ENCOUNTER — Encounter: Payer: Self-pay | Admitting: Cardiothoracic Surgery

## 2017-06-14 VITALS — BP 159/86 | HR 62 | Temp 97.6°F | Resp 18 | Ht 75.0 in | Wt 200.0 lb

## 2017-06-14 DIAGNOSIS — S2220XA Unspecified fracture of sternum, initial encounter for closed fracture: Secondary | ICD-10-CM

## 2017-06-14 NOTE — Progress Notes (Signed)
Patient ID: Ricky Riley, male   DOB: 07-14-1940, 77 y.o.   MRN: [id number]  Chief Complaint  Patient presents with  . New Patient (Initial Visit)    Sternal Fracture-delay healing possible malignant Lesion    Referred By Dr. Ola Spurr Reason for Referral sternal fracture  HPI Location, Quality, Duration, Severity, Timing, Context, Modifying Factors, Associated Signs and Symptoms.  Ricky Riley is a 77 y.o. male.  Several months ago he was unloading a truck load of watermelon.  He had lifted the majority of the watermelon off the back of the bed of the truck but one was further up into the bed and he reached over the side of the truck and brought the watermelon over the side on the quarter panel.  He did have his sternum up against the truck when he lifted the watermelon and noticed immediately some discomfort at the mid sternum.  He describes this now as a pulling sensation that over the weeks and months has gotten better but ultimately he went to the doctor after several weeks and had a chest x-ray made.  That was then followed by a CT scan and an MRI.  Initially there was some concern that he may have a pathologic sternal fracture but the MRI did not suggest such.  He does not carry a history of malignancy and there is no evidence of other disease.  The patient states that his pain is made worse with exertion or movement.  He has noticed that the pain is improved quite a bit over the last several weeks.   Past Medical History:  Diagnosis Date  . Arthritis   . Diabetes mellitus without complication ([place])   . GERD (gastroesophageal reflux disease)   . Headache   . Hypertension   . Sleep apnea     Past Surgical History:  Procedure Laterality Date  . ARTERY BIOPSY Left 03/02/2015   Procedure: BIOPSY TEMPORAL ARTERY;  Surgeon: Clyde Canterbury, MD;  Location: [facility] ORS;  Service: ENT;  Laterality: Left;  . PENILE PROSTHESIS IMPLANT    . TRANSURETHRAL RESECTION OF PROSTATE      Family  History  Problem Relation Age of Onset  . Diabetes Mother   . Heart disease Mother   . Diabetes Father   . Heart disease Father   . Diabetes Sister   . Diabetes Brother   . Kidney failure Brother     Social History Social History   Tobacco Use  . Smoking status: Former Smoker    Packs/day: 1.00    Types: Cigarettes    Last attempt to quit: 06/05/2009    Years since quitting: 8.0  . Smokeless tobacco: Never Used  Substance Use Topics  . Alcohol use: No  . Drug use: No    Allergies  Allergen Reactions  . Ciprofloxacin Hives  . Penicillins Hives  . Sulfa Antibiotics Hives  . Sulfasalazine Hives    Current Outpatient Medications  Medication Sig Dispense Refill  . aspirin 81 MG tablet Take 81 mg by mouth daily.    . Azelastine HCl 0.15 % SOLN Place 1-2 sprays into the nose 2 (two) times daily.    . cholecalciferol (VITAMIN D) 1000 UNITS tablet Take 3,000 Units by mouth daily.    . finasteride (PROSCAR) 5 MG tablet Take 5 mg by mouth daily.    . hydrochlorothiazide (HYDRODIURIL) 12.5 MG tablet Take 12.5 mg by mouth daily.    . meloxicam (MOBIC) 15 MG tablet Take by mouth.    Marland Kitchen  metFORMIN (GLUCOPHAGE) 500 MG tablet Take 500 mg by mouth daily with breakfast.    . mirtazapine (REMERON) 15 MG tablet Take by mouth.    . mometasone (NASONEX) 50 MCG/ACT nasal spray Place into the nose.    Marland Kitchen omeprazole (PRILOSEC) 20 MG capsule Take 20 mg by mouth daily.    . predniSONE (DELTASONE) 2.5 MG tablet Take 2.5 mg by mouth daily with breakfast.    . terazosin (HYTRIN) 10 MG capsule Take 10 mg by mouth at bedtime.     No current facility-administered medications for this visit.       Review of Systems A complete review of systems was asked and was negative except for the following positive findings some chest discomfort, shortness of breath, joint pain.  Blood pressure (!) 159/86, pulse 62, temperature 97.6 F (36.4 C), temperature source Oral, resp. rate 18, height 6\' 3"  (1.905 m),  weight 200 lb (90.7 kg), SpO2 96 %.  Physical Exam CONSTITUTIONAL:  Pleasant, well-developed, well-nourished, and in no acute distress. EYES: Pupils equal and reactive to light, Sclera non-icteric EARS, NOSE, MOUTH AND THROAT:  The oropharynx was clear.  Dentition is good repair.  Oral mucosa pink and moist. LYMPH NODES:  Lymph nodes in the neck and axillae were normal RESPIRATORY:  Lungs were clear.  Normal respiratory effort without pathologic use of accessory muscles of respiration CARDIOVASCULAR: Heart was regular without murmurs.  There were no carotid bruits.  GI: The abdomen was soft, nontender, and nondistended. There were no palpable masses. There was no hepatosplenomegaly. There were normal bowel sounds in all quadrants. GU:  Rectal deferred.   MUSCULOSKELETAL:  Normal muscle strength and tone.  No clubbing or cyanosis.   SKIN:  There were no pathologic skin lesions.  There were no nodules on palpation. NEUROLOGIC:  Sensation is normal.  Cranial nerves are grossly intact. PSYCH:  Oriented to person, place and time.  Mood and affect are normal.  Palpation of the sternum revealed no instability.  There is no sternal click.  There is no overlying mass or erythema.  Data Reviewed Chest x-ray MRI and CT scan  I have personally reviewed the patient's imaging, laboratory findings and medical records.    Assessment    Overall I believe that this patient most likely has a sternal fracture secondary to trauma.  I do not see any soft tissue mass to suggest that this could be metastatic disease.    Plan    As the patient is slowly progressing from a functional standpoint I do not believe that any additional follow-up is required outside of his normal routine exams.  There is no indication for surgical intervention at this time.       Nestor Lewandowsky, MD 06/14/2017, 11:48 AM

## 2017-06-14 NOTE — Patient Instructions (Signed)
Please call our office if you have questions or concerns.   

## 2018-03-21 ENCOUNTER — Other Ambulatory Visit: Payer: Self-pay | Admitting: Family Medicine

## 2018-03-21 ENCOUNTER — Ambulatory Visit
Admission: RE | Admit: 2018-03-21 | Discharge: 2018-03-21 | Disposition: A | Discharge status: Home / Self Care | Payer: Medicare Other | Source: Ambulatory Visit | Attending: Family Medicine | Admitting: Family Medicine

## 2018-03-21 DIAGNOSIS — K429 Umbilical hernia without obstruction or gangrene: Secondary | ICD-10-CM | POA: Diagnosis not present

## 2018-03-21 DIAGNOSIS — N281 Cyst of kidney, acquired: Secondary | ICD-10-CM | POA: Diagnosis not present

## 2018-03-21 DIAGNOSIS — J9811 Atelectasis: Secondary | ICD-10-CM | POA: Insufficient documentation

## 2018-03-21 DIAGNOSIS — N200 Calculus of kidney: Secondary | ICD-10-CM | POA: Insufficient documentation

## 2018-03-21 DIAGNOSIS — R1032 Left lower quadrant pain: Secondary | ICD-10-CM | POA: Diagnosis present

## 2018-03-21 DIAGNOSIS — J90 Pleural effusion, not elsewhere classified: Secondary | ICD-10-CM | POA: Diagnosis not present

## 2018-03-21 MED ORDER — IOPAMIDOL (ISOVUE-300) INJECTION 61%
100.0000 mL | Freq: Once | INTRAVENOUS | Status: AC | PRN
  Administered 2018-03-21: 100 mL via INTRAVENOUS

## 2018-09-30 IMAGING — MR MR STERNUM WO/W CM
11 series · 40 of 40 positions shown · IV contrast (19ML MULTIHANCE)
Comparison: CT chest dated April 29, 2017.

ADDENDUM:
After further review the images, there may indeed be a small
enhancing soft tissue component anteriorly (series 11, images 10 and
11). This increases the possibility of an underlying malignant
lesion. Therefore, a follow-up MRI of the sternum with and without
contrast is recommended in 3 months for further evaluation.

Also, the text under "Clinical Data" should read: "Sternal fracture
after lifting a watermelon 4 months ago."
These results will be called to the ordering clinician or
representative by the Radiologist Assistant, and communication
documented in the PACS or zVision Dashboard.
CLINICAL DATA: Sternal fracture after left anal watermelon 4
months.
EXAM:
MR STERNUM WITHOUT AND WITH CONTRAST
TECHNIQUE: Multiplanar, multisequence MR imaging of the sternum was performed
with and without intravenous contrast.
CONTRAST:  19mL MULTIHANCE GADOBENATE DIMEGLUMINE 529 MG/ML IV SOLN

[Series 3: T1 · coronal · 4.0mm · 0.59mm/px · 2 of 15 slices shown (1 of 2)]
[im 1/15]
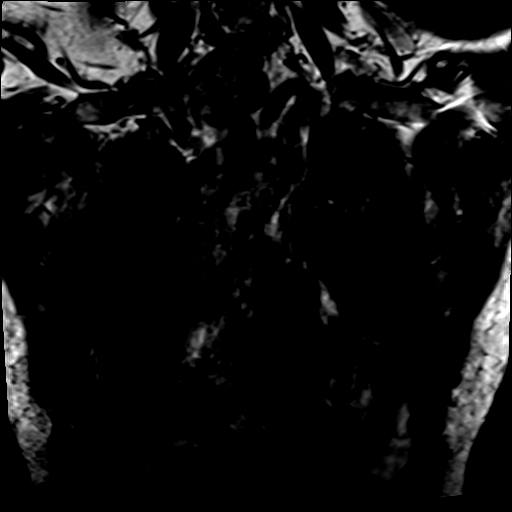
[im 15/15]
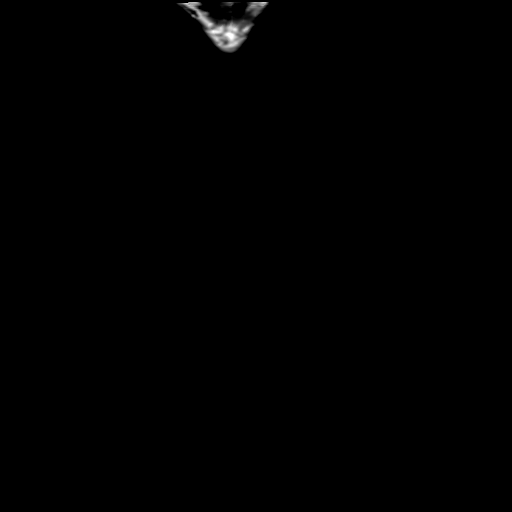

[Series 4: T2 fat-sat · coronal · 4.0mm · 0.94mm/px · 2 of 15 slices shown (1 of 2)]
[im 1/15]
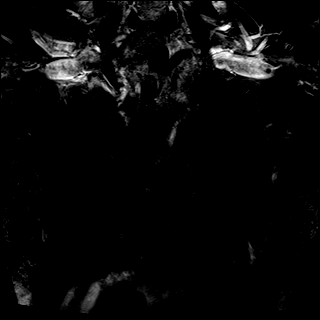
[im 15/15]
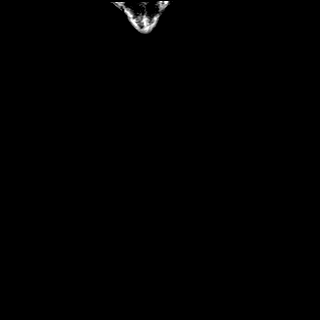

[Series 5: T1 · axial · 4.0mm · 0.98mm/px · z∈[-106,+88]mm · 5 of 41 slices shown (2 of 2)]
[im 1/41]
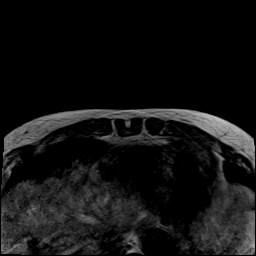
[im 11/41]
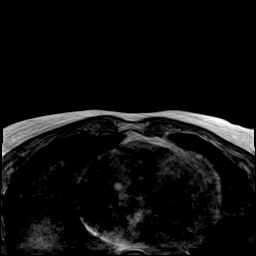
[im 21/41]
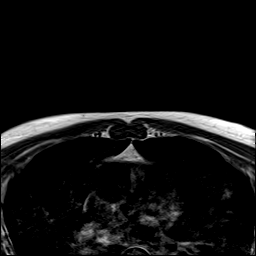
[im 31/41]
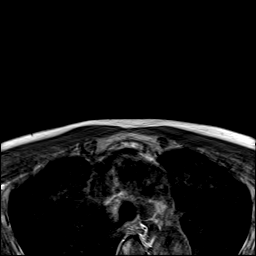
[im 41/41]
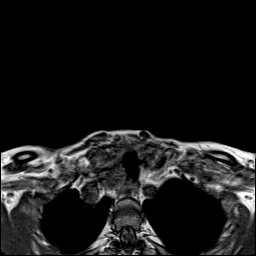

[Series 6: T2 fat-sat · axial · 4.0mm · 0.98mm/px · z∈[-106,+88]mm · 5 of 41 slices shown (2 of 2)]
[im 1/41]
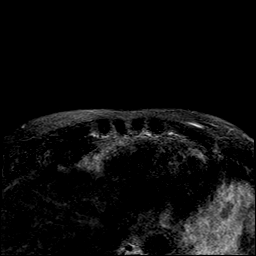
[im 11/41]
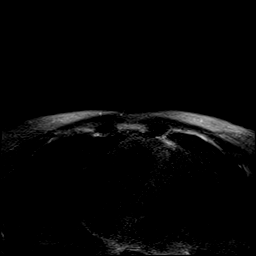
[im 21/41]
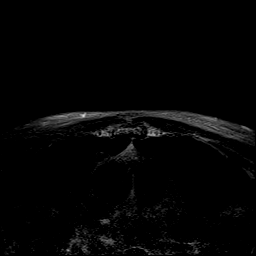
[im 31/41]
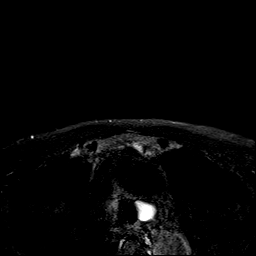
[im 41/41]
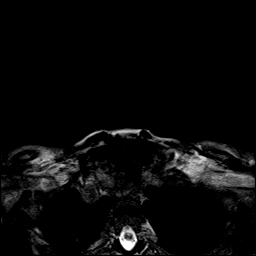

[Series 7: STIR · axial · 4.0mm · 0.98mm/px · z∈[-106,+88]mm · 5 of 41 slices shown]
[im 1/41]
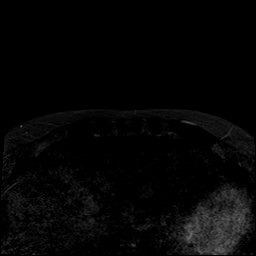
[im 11/41]
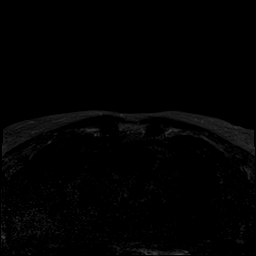
[im 21/41]
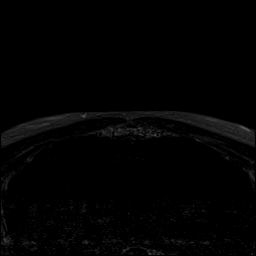
[im 31/41]
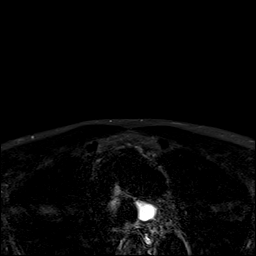
[im 41/41]
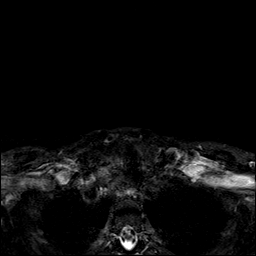

[Series 8: PD fat-sat · sagittal · 4.0mm · 0.78mm/px · 3 of 20 slices shown]
[im 1/20]
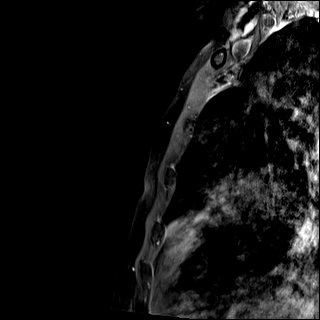
[im 10/20]
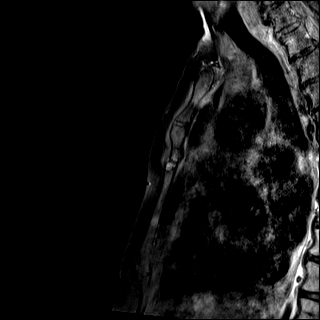
[im 20/20]
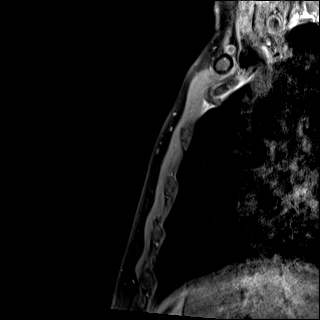

[Series 9: T1 fat-sat · sagittal · non-contrast · 4.0mm · 0.78mm/px · 3 of 20 slices shown (1 of 2)]
[im 1/20]
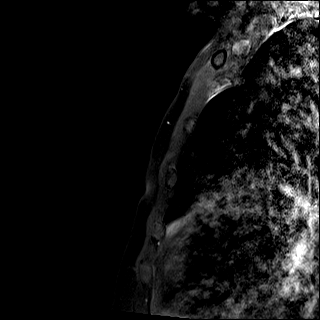
[im 10/20]
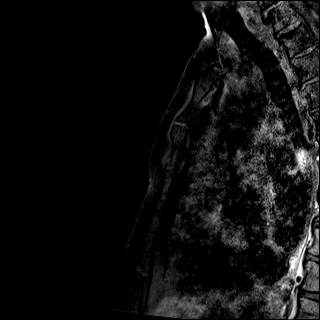
[im 20/20]
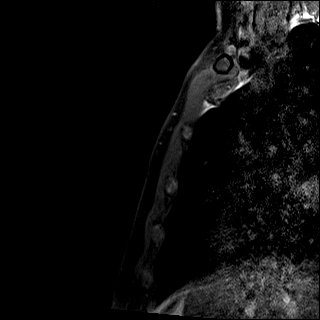

[Series 10: T1 fat-sat · axial · non-contrast · 4.0mm · 0.98mm/px · z∈[-106,+88]mm · 5 of 41 slices shown (2 of 2)]
[im 1/41]
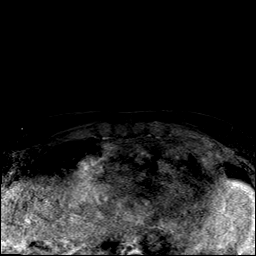
[im 11/41]
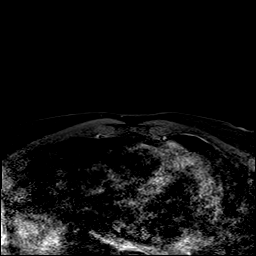
[im 21/41]
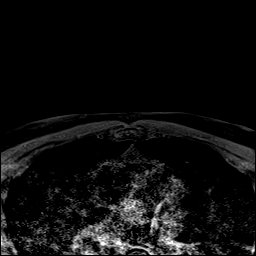
[im 31/41]
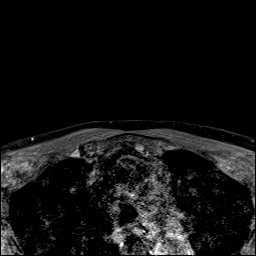
[im 41/41]
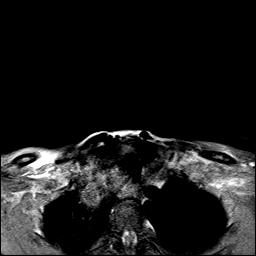

[Series 11: T1 fat-sat post-contrast · sagittal · 4.0mm · 0.78mm/px · 3 of 20 slices shown (1 of 3)]
[im 1/20]
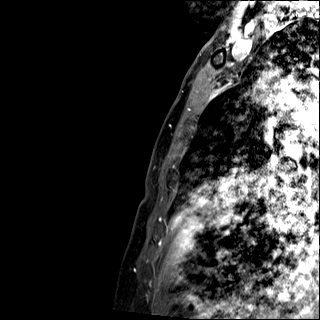
[im 10/20]
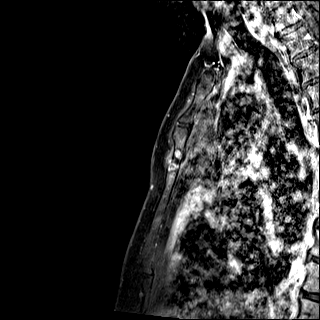
[im 20/20]
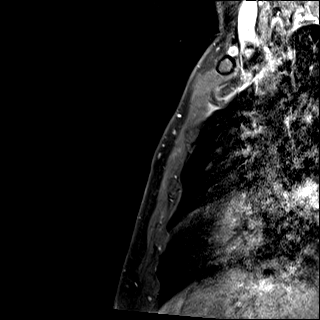

[Series 12: T1 fat-sat post-contrast · coronal · 4.0mm · 1.17mm/px · 2 of 15 slices shown (2 of 3)]
[im 1/15]
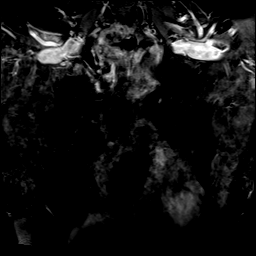
[im 15/15]
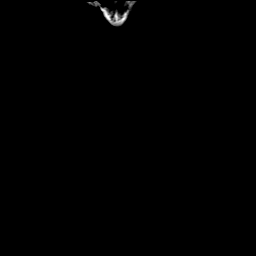

[Series 13: T1 fat-sat post-contrast · axial · 4.0mm · 0.98mm/px · z∈[-106,+88]mm · 5 of 41 slices shown (3 of 3)]
[im 1/41]
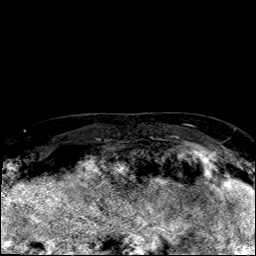
[im 11/41]
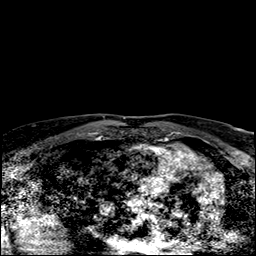
[im 21/41]
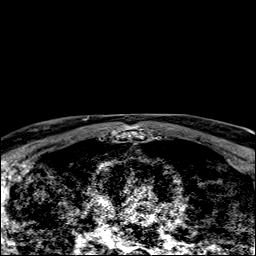
[im 31/41]
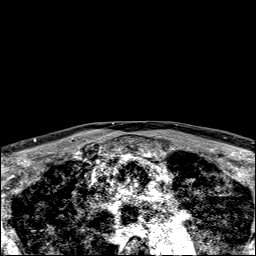
[im 41/41]
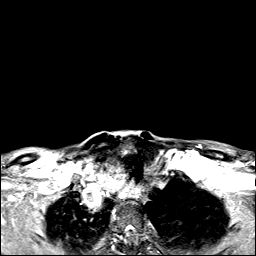

[40 of 40 positions shown; findings below may reference images not displayed]

FINDINGS: Bones/Joint/Cartilage

Again seen is a transverse fracture through the proximal sternal
body just below the sternomanubrial joint. There is relatively
symmetric, enhancing cystic change along the fracture margins,
favored to represent resorption. No discrete bony lesion is
identified.

Degenerative changes of the bilateral sternoclavicular joints.

Muscles and Tendons

Unremarkable.

Soft tissues

Unremarkable.  No soft tissue mass.
IMPRESSION: 1. Findings favored to represent delayed union of a healing
transverse fracture through the proximal sternal body. No discrete
bony lesion or soft tissue mass is seen. Continued follow-up to
resolution is recommended.

## 2019-03-12 DIAGNOSIS — I38 Endocarditis, valve unspecified: Secondary | ICD-10-CM | POA: Insufficient documentation

## 2019-03-12 DIAGNOSIS — E119 Type 2 diabetes mellitus without complications: Secondary | ICD-10-CM | POA: Insufficient documentation

## 2019-04-14 ENCOUNTER — Ambulatory Visit: Payer: Self-pay | Admitting: General Surgery

## 2019-04-14 NOTE — H&P (Signed)
PATIENT PROFILE: Ricky Riley is a 78 y.o. male who presents to the Clinic for consultation at the request of Dr. Edwina Barth for evaluation of left hernia.  PCP:  Velna Ochs, MD  HISTORY OF PRESENT ILLNESS: [name] reports he has a left inguinal hernia since a year ago.  Reports that he was having left lower quadrant pain since last year.  The pain did not radiate to other part of the body.  Pain aggravated with blowing his nose and sneezing.  Alleviating factor was resting.  She had a CT scan done suspecting diverticular disease but it was negative for any other intra-abdominal pathology.  I personally evaluated the images.  Patient reports a bulging on the left groin that comes in and out.  Reports eating well.  Denies having abdominal distention nausea or vomiting.   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   Abnormal CT scan, neck 03/21/2016   Hypercalcemia 03/12/2016   Chronic cough 01/23/2016   Current chronic use of systemic steroids 01/23/2016   Headache disorder 02/09/2015   Headache 01/26/2015   Hypertension 03/17/2014   Heart palpitations 03/17/2014   Sleep apnea 03/17/2014   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   ED (erectile dysfunction) 01/15/2013   BPH (benign prostatic hyperplasia) 01/14/2012   Prostatitis, 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  Positive for pain Musculoskeletal ROS: negative for - joint swelling or muscle pain Neurological ROS: negative for - confusion, syncope Dermatological ROS: negative for pruritus and rash Psychiatric: negative for anxiety, depression, difficulty sleeping and memory loss  MEDICATIONS: Current Medications        Current Outpatient Medications  Medication Sig Dispense Refill  . aspirin 81 MG EC tablet Take 81 mg by mouth once daily.    . cetirizine (ZYRTEC) 10 MG tablet Take 10 mg by mouth once daily as needed.     . cholecalciferol (VITAMIN D3) 1,000 unit capsule Take 3,000 Units by mouth once daily.      . finasteride (PROSCAR) 5 mg tablet Take 1 tablet (5 mg total) by mouth once daily. 30 tablet 11  . hydrochlorothiazide (HYDRODIURIL) 25 MG tablet 25 mg (Patient taking differently: Take 25 mg by mouth once daily. Patient takes 12.5 mg daily. )    . losartan (COZAAR) 25 MG tablet Take 1 tablet (25 mg total) by mouth once daily 90 tablet 1  . metFORMIN (GLUMETZA) 500 MG (MOD) ER tablet Take 500 mg by mouth daily with dinner.    . mirtazapine (REMERON) 15 MG tablet  1 tab by mouth at bedtime    . mometasone (NASONEX) 50 mcg/actuation nasal spray Place 2 sprays into both nostrils once daily as needed.      Marland Kitchen omeprazole (PRILOSEC) 20 MG DR capsule TAKE 2 CAPSULES BY MOUTH ONCE EVERY DAY TO CONTROL STOMACH ACID.  TAKE 30 MINUTES BEFORE A MEAL    . terazosin (HYTRIN) 10 MG capsule Take 1 capsule (10 mg total) by mouth nightly. 30 capsule 11   No current facility-administered medications for this visit.        ALLERGIES: Aerobid [flunisolide], Atorvastatin, Cipro [ciprofloxacin], Ciprofibrate, Entex [pseudoephedrine tannate], Lisinopril, Penicillins, Rosuvastatin, Sulfa (sulfonamide antibiotics), and Sulfasalazine  PAST MEDICAL HISTORY:     Past Medical History:  Diagnosis Date  . Allergic state   . BPH (benign prostatic hyperplasia)   . ED (erectile dysfunction)   . GERD (gastroesophageal reflux disease)   . Guaiac positive stools    followed by Dr. Ara Kussmaul  . Hypertension   . Prostatitis, chronic   . Sleep apnea     PAST SURGICAL HISTORY:      Past Surgical History:  Procedure Laterality Date  . BUNION CORRECTION Left    Left great toe  . Penile implant    . TURP VAPORIZATION       FAMILY HISTORY:      Family History  Problem Relation Age of Onset  . Diabetes type II Mother   . Coronary Artery Disease (Blocked arteries around heart) Father   . Diabetes type II Father   . Myocardial Infarction (Heart attack) Father   . No Known Problems Sister   . Diabetes type II Brother   . Kidney failure Brother   . Heart disease Brother      SOCIAL HISTORY: Social History          Socioeconomic History  . Marital status: Married    Spouse name: Not on file  . Number of children: Not on file  . Years of education: Not on file  . Highest education level: Not on file  Occupational History  . Not on file  Social Needs  . Financial resource strain: Not on file  . Food insecurity    Worry: Not on file    Inability: Not on file  . Transportation needs    Medical: Not on file    Non-medical: Not on file  Tobacco Use  . Smoking status: Former Smoker    Packs/day: 1.00    Years: 30.00    Pack years: 30.00    Types: Cigarettes    Quit date: 06/11/2010    Years since quitting: 8.8  . Smokeless tobacco: Former Systems developer    Quit date: 08/14/2010  . Tobacco comment: Advised to not resume  Substance and Sexual Activity  .  Alcohol use: No    Alcohol/week: 0.0 standard drinks  . Drug use: No  . Sexual activity: Defer  Other Topics Concern  . Not on file  Social History Narrative  . Not on file      PHYSICAL EXAM:    Vitals:   04/14/19 0815  BP: 179/89  Pulse: 61   Body mass index is 23.37 kg/m. Weight: 84.8 kg (187 lb)   GENERAL: Alert, active, oriented x3  HEENT: Pupils equal reactive to light. Extraocular movements are intact. Sclera clear. Palpebral conjunctiva normal red color.Pharynx clear.  NECK: Supple with no palpable mass and no adenopathy.  LUNGS: Sound clear with no rales rhonchi or wheezes.  HEART: Regular rhythm  S1 and S2 without murmur.  ABDOMEN: Soft and depressible, nontender with no palpable mass, no hepatomegaly.  Left groin hernia, reducible, minimally tender upon reduction.  EXTREMITIES: Well-developed well-nourished symmetrical with no dependent edema.  NEUROLOGICAL: Awake alert oriented, facial expression symmetrical, moving all extremities.  REVIEW OF DATA: I have reviewed the following data today:      Appointment on 03/23/2019  Component Date Value  . Glucose 03/23/2019 128*  . Sodium 03/23/2019 139   . Potassium 03/23/2019 4.0   . Chloride 03/23/2019 106   . Carbon Dioxide (CO2) 03/23/2019 28.3   . Urea Nitrogen (BUN) 03/23/2019 20   . Creatinine 03/23/2019 1.4*  . Glomerular Filtration Ra* 03/23/2019 60*  . Calcium 03/23/2019 10.2   . AST  03/23/2019 16   . ALT  03/23/2019 9   . Alk Phos (alkaline Phosp* 03/23/2019 73   . Albumin 03/23/2019 4.2   . Bilirubin, Total 03/23/2019 0.5   . Protein, Total 03/23/2019 7.3   . A/G Ratio 03/23/2019 1.4   . WBC (White Blood Cell Co* 03/23/2019 5.5   . RBC (Red Blood Cell Coun* 03/23/2019 4.47*  . Hemoglobin 03/23/2019 12.0*  . Hematocrit 03/23/2019 37.9*  . MCV (Mean Corpuscular Vo* 03/23/2019 84.8   . MCH (Mean Corpuscular He* 03/23/2019 26.8*  . MCHC (Mean Corpuscular H* 03/23/2019 31.7*  .  Platelet Count 03/23/2019 188   . RDW-CV (Red Cell Distrib* 03/23/2019 13.2   . MPV (Mean Platelet Volum* 03/23/2019 11.0   . Neutrophils 03/23/2019 3.70   . Lymphocytes 03/23/2019 1.37   . Monocytes 03/23/2019 0.32   . Eosinophils 03/23/2019 0.10   . Basophils 03/23/2019 0.03   . Neutrophil % 03/23/2019 66.9   . Lymphocyte % 03/23/2019 24.8   . Monocyte % 03/23/2019 5.8   . Eosinophil % 03/23/2019 1.8   . Basophil% 03/23/2019 0.5   . Immature Granulocyte % 03/23/2019 0.2   . Immature Granulocyte Cou* 03/23/2019 0.01   . Cholesterol, Total 03/23/2019 110   . Triglyceride 03/23/2019 76   . HDL (High Density Lipopr* 03/23/2019 37.1   . LDL Calculated 03/23/2019 58   . VLDL Cholesterol 03/23/2019 15   . Cholesterol/HDL Ratio 03/23/2019 3.0   . Hemoglobin A1C 03/23/2019 6.3*  . Average Blood Glucose (C* 03/23/2019 134   . Color 03/24/2019 Yellow   . Clarity 03/24/2019 Clear   . Specific Gravity 03/24/2019 1.020   . pH, Urine 03/24/2019 7.0   . Protein, Urinalysis 03/24/2019 Negative   . Glucose, Urinalysis 03/24/2019 Negative   . Ketones, Urinalysis 03/24/2019 Negative   . Blood, Urinalysis 03/24/2019 Negative   . Nitrite, Urinalysis 03/24/2019 Positive*  . Leukocyte Esterase, Urin* 03/24/2019 Trace*  . White Blood Cells, Urina* 03/24/2019 4-10*  . Red Blood Cells, Urinaly* 03/24/2019 0-3   . Bacteria, Urinalysis 03/24/2019 Few*  . Squamous Epithelial Cell* 03/24/2019 None Seen      ASSESSMENT: Mr. Quintanar is a 78 y.o. male presenting for consultation for left inguinal hernia.    The patient presents with a symptomatic, reducible inguinal hernia. Patient was oriented about the diagnosis of inguinal hernia and its implication. The patient was oriented about the treatment alternatives (observation vs surgical repair). Due to patient symptoms, repair is recommended. Patient oriented about the surgical procedure, the use of mesh and its risk of complications such as: infection,  bleeding, injury to vas deference, vasculature and testicle, injury to bowel or bladder, and chronic pain.  Non-recurrent unilateral inguinal hernia without obstruction or gangrene [K40.90]  PLAN: 1. Robotic assisted laparoscopic left inguinal hernia repair with mesh (06991) 2. CBC, CMP done on 03/23/2019 3. Internal medicine clearance (Dr. Edwina Barth) 4. Stop taking aspirin 5 days before surgery.  5. Contact us if has any question or concern.   Patient verbalized understanding, all questions were answered, and were agreeable with the plan outlined above.    Herbert Pun, MD  Electronically signed by Herbert Pun, MD

## 2019-04-14 NOTE — H&P (View-Only) (Signed)
PATIENT PROFILE: Ricky Riley is a 78 y.o. male who presents to the Clinic for consultation at the request of Dr. Edwina Barth for evaluation of left hernia.  PCP:  Velna Ochs, MD  HISTORY OF PRESENT ILLNESS: Mr. Vazguez reports he has a left inguinal hernia since a year ago.  Reports that he was having left lower quadrant pain since last year.  The pain did not radiate to other part of the body.  Pain aggravated with blowing his nose and sneezing.  Alleviating factor was resting.  She had a CT scan done suspecting diverticular disease but it was negative for any other intra-abdominal pathology.  I personally evaluated the images.  Patient reports a bulging on the left groin that comes in and out.  Reports eating well.  Denies having abdominal distention nausea or vomiting.   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   Abnormal CT scan, neck 03/21/2016   Hypercalcemia 03/12/2016   Chronic cough 01/23/2016   Current chronic use of systemic steroids 01/23/2016   Headache disorder 02/09/2015   Headache 01/26/2015   Hypertension 03/17/2014   Heart palpitations 03/17/2014   Sleep apnea 03/17/2014   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   ED (erectile dysfunction) 01/15/2013   BPH (benign prostatic hyperplasia) 01/14/2012   Prostatitis, 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  Positive for pain Musculoskeletal ROS: negative for - joint swelling or muscle pain Neurological ROS: negative for - confusion, syncope Dermatological ROS: negative for pruritus and rash Psychiatric: negative for anxiety, depression, difficulty sleeping and memory loss  MEDICATIONS: Current Medications        Current Outpatient Medications  Medication Sig Dispense Refill  . aspirin 81 MG EC tablet Take 81 mg by mouth once daily.    . cetirizine (ZYRTEC) 10 MG tablet Take 10 mg by mouth once daily as needed.     . cholecalciferol (VITAMIN D3) 1,000 unit capsule Take 3,000 Units by mouth once daily.      . finasteride (PROSCAR) 5 mg tablet Take 1 tablet (5 mg total) by mouth once daily. 30 tablet 11  . hydrochlorothiazide (HYDRODIURIL) 25 MG tablet 25 mg (Patient taking differently: Take 25 mg by mouth once daily. Patient takes 12.5 mg daily. )    . losartan (COZAAR) 25 MG tablet Take 1 tablet (25 mg total) by mouth once daily 90 tablet 1  . metFORMIN (GLUMETZA) 500 MG (MOD) ER tablet Take 500 mg by mouth daily with dinner.    . mirtazapine (REMERON) 15 MG tablet  1 tab by mouth at bedtime    . mometasone (NASONEX) 50 mcg/actuation nasal spray Place 2 sprays into both nostrils once daily as needed.      Marland Kitchen omeprazole (PRILOSEC) 20 MG DR capsule TAKE 2 CAPSULES BY MOUTH ONCE EVERY DAY TO CONTROL STOMACH ACID.  TAKE 30 MINUTES BEFORE A MEAL    . terazosin (HYTRIN) 10 MG capsule Take 1 capsule (10 mg total) by mouth nightly. 30 capsule 11   No current facility-administered medications for this visit.        ALLERGIES: Aerobid [flunisolide], Atorvastatin, Cipro [ciprofloxacin], Ciprofibrate, Entex [pseudoephedrine tannate], Lisinopril, Penicillins, Rosuvastatin, Sulfa (sulfonamide antibiotics), and Sulfasalazine  PAST MEDICAL HISTORY:     Past Medical History:  Diagnosis Date  . Allergic state   . BPH (benign prostatic hyperplasia)   . ED (erectile dysfunction)   . GERD (gastroesophageal reflux disease)   . Guaiac positive stools    followed by Dr. Ara Kussmaul  . Hypertension   . Prostatitis, chronic   . Sleep apnea     PAST SURGICAL HISTORY:      Past Surgical History:  Procedure Laterality Date  . BUNION CORRECTION Left    Left great toe  . Penile implant    . TURP VAPORIZATION       FAMILY HISTORY:      Family History  Problem Relation Age of Onset  . Diabetes type II Mother   . Coronary Artery Disease (Blocked arteries around heart) Father   . Diabetes type II Father   . Myocardial Infarction (Heart attack) Father   . No Known Problems Sister   . Diabetes type II Brother   . Kidney failure Brother   . Heart disease Brother      SOCIAL HISTORY: Social History          Socioeconomic History  . Marital status: Married    Spouse name: Not on file  . Number of children: Not on file  . Years of education: Not on file  . Highest education level: Not on file  Occupational History  . Not on file  Social Needs  . Financial resource strain: Not on file  . Food insecurity    Worry: Not on file    Inability: Not on file  . Transportation needs    Medical: Not on file    Non-medical: Not on file  Tobacco Use  . Smoking status: Former Smoker    Packs/day: 1.00    Years: 30.00    Pack years: 30.00    Types: Cigarettes    Quit date: 06/11/2010    Years since quitting: 8.8  . Smokeless tobacco: Former Systems developer    Quit date: 08/14/2010  . Tobacco comment: Advised to not resume  Substance and Sexual Activity  .  Alcohol use: No    Alcohol/week: 0.0 standard drinks  . Drug use: No  . Sexual activity: Defer  Other Topics Concern  . Not on file  Social History Narrative  . Not on file      PHYSICAL EXAM:    Vitals:   04/14/19 0815  BP: 179/89  Pulse: 61   Body mass index is 23.37 kg/m. Weight: 84.8 kg (187 lb)   GENERAL: Alert, active, oriented x3  HEENT: Pupils equal reactive to light. Extraocular movements are intact. Sclera clear. Palpebral conjunctiva normal red color.Pharynx clear.  NECK: Supple with no palpable mass and no adenopathy.  LUNGS: Sound clear with no rales rhonchi or wheezes.  HEART: Regular rhythm  S1 and S2 without murmur.  ABDOMEN: Soft and depressible, nontender with no palpable mass, no hepatomegaly.  Left groin hernia, reducible, minimally tender upon reduction.  EXTREMITIES: Well-developed well-nourished symmetrical with no dependent edema.  NEUROLOGICAL: Awake alert oriented, facial expression symmetrical, moving all extremities.  REVIEW OF DATA: I have reviewed the following data today:      Appointment on 03/23/2019  Component Date Value  . Glucose 03/23/2019 128*  . Sodium 03/23/2019 139   . Potassium 03/23/2019 4.0   . Chloride 03/23/2019 106   . Carbon Dioxide (CO2) 03/23/2019 28.3   . Urea Nitrogen (BUN) 03/23/2019 20   . Creatinine 03/23/2019 1.4*  . Glomerular Filtration Ra* 03/23/2019 60*  . Calcium 03/23/2019 10.2   . AST  03/23/2019 16   . ALT  03/23/2019 9   . Alk Phos (alkaline Phosp* 03/23/2019 73   . Albumin 03/23/2019 4.2   . Bilirubin, Total 03/23/2019 0.5   . Protein, Total 03/23/2019 7.3   . A/G Ratio 03/23/2019 1.4   . WBC (White Blood Cell Co* 03/23/2019 5.5   . RBC (Red Blood Cell Coun* 03/23/2019 4.47*  . Hemoglobin 03/23/2019 12.0*  . Hematocrit 03/23/2019 37.9*  . MCV (Mean Corpuscular Vo* 03/23/2019 84.8   . MCH (Mean Corpuscular He* 03/23/2019 26.8*  . MCHC (Mean Corpuscular H* 03/23/2019 31.7*  .  Platelet Count 03/23/2019 188   . RDW-CV (Red Cell Distrib* 03/23/2019 13.2   . MPV (Mean Platelet Volum* 03/23/2019 11.0   . Neutrophils 03/23/2019 3.70   . Lymphocytes 03/23/2019 1.37   . Monocytes 03/23/2019 0.32   . Eosinophils 03/23/2019 0.10   . Basophils 03/23/2019 0.03   . Neutrophil % 03/23/2019 66.9   . Lymphocyte % 03/23/2019 24.8   . Monocyte % 03/23/2019 5.8   . Eosinophil % 03/23/2019 1.8   . Basophil% 03/23/2019 0.5   . Immature Granulocyte % 03/23/2019 0.2   . Immature Granulocyte Cou* 03/23/2019 0.01   . Cholesterol, Total 03/23/2019 110   . Triglyceride 03/23/2019 76   . HDL (High Density Lipopr* 03/23/2019 37.1   . LDL Calculated 03/23/2019 58   . VLDL Cholesterol 03/23/2019 15   . Cholesterol/HDL Ratio 03/23/2019 3.0   . Hemoglobin A1C 03/23/2019 6.3*  . Average Blood Glucose (C* 03/23/2019 134   . Color 03/24/2019 Yellow   . Clarity 03/24/2019 Clear   . Specific Gravity 03/24/2019 1.020   . pH, Urine 03/24/2019 7.0   . Protein, Urinalysis 03/24/2019 Negative   . Glucose, Urinalysis 03/24/2019 Negative   . Ketones, Urinalysis 03/24/2019 Negative   . Blood, Urinalysis 03/24/2019 Negative   . Nitrite, Urinalysis 03/24/2019 Positive*  . Leukocyte Esterase, Urin* 03/24/2019 Trace*  . White Blood Cells, Urina* 03/24/2019 4-10*  . Red Blood Cells, Urinaly* 03/24/2019 0-3   . Bacteria, Urinalysis 03/24/2019 Few*  . Squamous Epithelial Cell* 03/24/2019 None Seen      ASSESSMENT: Mr. Frady is a 78 y.o. male presenting for consultation for left inguinal hernia.    The patient presents with a symptomatic, reducible inguinal hernia. Patient was oriented about the diagnosis of inguinal hernia and its implication. The patient was oriented about the treatment alternatives (observation vs surgical repair). Due to patient symptoms, repair is recommended. Patient oriented about the surgical procedure, the use of mesh and its risk of complications such as: infection,  bleeding, injury to vas deference, vasculature and testicle, injury to bowel or bladder, and chronic pain.  Non-recurrent unilateral inguinal hernia without obstruction or gangrene [K40.90]  PLAN: 1. Robotic assisted laparoscopic left inguinal hernia repair with mesh (89211) 2. CBC, CMP done on 03/23/2019 3. Internal medicine clearance (Dr. Edwina Barth) 4. Stop taking aspirin 5 days before surgery.  5. Contact us if has any question or concern.   Patient verbalized understanding, all questions were answered, and were agreeable with the plan outlined above.    Herbert Pun, MD  Electronically signed by Herbert Pun, MD

## 2019-04-17 ENCOUNTER — Other Ambulatory Visit: Payer: Self-pay

## 2019-04-17 ENCOUNTER — Encounter
Admission: RE | Admit: 2019-04-17 | Discharge: 2019-04-17 | Disposition: A | Discharge status: Home / Self Care | Payer: Medicare Other | Source: Ambulatory Visit | Attending: General Surgery | Admitting: General Surgery

## 2019-04-17 DIAGNOSIS — Z20828 Contact with and (suspected) exposure to other viral communicable diseases: Secondary | ICD-10-CM | POA: Diagnosis not present

## 2019-04-17 DIAGNOSIS — Z01812 Encounter for preprocedural laboratory examination: Secondary | ICD-10-CM | POA: Insufficient documentation

## 2019-04-17 HISTORY — DX: Cardiac arrhythmia, unspecified: I49.9

## 2019-04-17 LAB — SARS CORONAVIRUS 2 (TAT 6-24 HRS): SARS Coronavirus 2: NEGATIVE

## 2019-04-17 NOTE — Patient Instructions (Signed)
Your procedure is scheduled on:Wed. 11/11 Report to Day Surgery. To find out your arrival time please call [phone] between 1PM - 3PM on Tues 11/10.  Remember: Instructions that are not followed completely may result in serious medical risk,  up to and including death, or upon the discretion of your surgeon and anesthesiologist your  surgery may need to be rescheduled.     _X__ 1. Do not eat food after midnight the night before your procedure.                 No gum chewing or hard candies. You may drink clear liquids up to 2 hours                 before you are scheduled to arrive for your surgery- DO not drink clear                 liquids within 2 hours of the start of your surgery.                 Clear Liquids include:  water, Black Coffee or Tea (Do not add                 anything to coffee or tea).  __X__2.  On the morning of surgery brush your teeth with toothpaste and water, you                may rinse your mouth with mouthwash if you wish.  Do not swallow any toothpaste of mouthwash.     ___ 3.  No Alcohol for 24 hours before or after surgery.   ___ 4.  Do Not Smoke or use e-cigarettes For 24 Hours Prior to Your Surgery.                 Do not use any chewable tobacco products for at least 6 hours prior to                 surgery.  ____  5.  Bring all medications with you on the day of surgery if instructed.   __x__  6.  Notify your doctor if there is any change in your medical condition      (cold, fever, infections).     Do not wear jewelry, make-up, hairpins, clips or nail polish. Do not wear lotions, powders, or perfumes. You may wear deodorant. Do not shave 48 hours prior to surgery. Men may shave face and neck. Do not bring valuables to the hospital.    [facility] is not responsible for any belongings or valuables.  Contacts, dentures or bridgework may not be worn into surgery. Leave your suitcase in the car. After surgery it may be  brought to your room. For patients admitted to the hospital, discharge time is determined by your treatment team.   Patients discharged the day of surgery will not be allowed to drive home.   Please read over the following fact sheets that you were given:    __x__ Take these medicines the morning of surgery with A SIP OF WATER:    1. acetaminophen (TYLENOL) 500 MG tablet if needed  2. cetirizine (ZYRTEC) 10 MG tablet if needed  3. finasteride (PROSCAR) 5 MG tablet  4.omeprazole (PRILOSEC) 20 MG capsule  Night before and morning of the surgery  5.  6.  ____ Fleet Enema (as directed)   __x__ Use CHG Soap as directed  ____ Use inhalers on the day of surgery  ____ Stop metformin 2 days prior to surgery    ____ Take 1/2 of usual insulin dose the night before surgery. No insulin the morning          of surgery.   _x___ Stopped aspirin yesterday  ____ Stop Anti-inflammatories on    ____ Stop supplements until after surgery.    __x__ Bring C-Pap to the hospital.

## 2019-04-21 MED ORDER — CLINDAMYCIN PHOSPHATE 900 MG/50ML IV SOLN
900.0000 mg | INTRAVENOUS | Status: AC
  Administered 2019-04-22: 900 mg via INTRAVENOUS

## 2019-04-22 ENCOUNTER — Encounter
Admission: RE | Disposition: A | Discharge status: Home / Self Care | Payer: Self-pay | Source: Home / Self Care | Attending: General Surgery

## 2019-04-22 ENCOUNTER — Other Ambulatory Visit: Payer: Self-pay

## 2019-04-22 ENCOUNTER — Observation Stay
Admission: RE | Admit: 2019-04-22 | Discharge: 2019-04-23 | Disposition: A | Discharge status: Home / Self Care | Payer: Medicare Other | Attending: General Surgery | Admitting: General Surgery

## 2019-04-22 ENCOUNTER — Ambulatory Visit: Payer: Medicare Other | Admitting: Certified Registered Nurse Anesthetist

## 2019-04-22 DIAGNOSIS — K4090 Unilateral inguinal hernia, without obstruction or gangrene, not specified as recurrent: Principal | ICD-10-CM | POA: Insufficient documentation

## 2019-04-22 DIAGNOSIS — N401 Enlarged prostate with lower urinary tract symptoms: Secondary | ICD-10-CM | POA: Diagnosis not present

## 2019-04-22 DIAGNOSIS — G4730 Sleep apnea, unspecified: Secondary | ICD-10-CM | POA: Insufficient documentation

## 2019-04-22 DIAGNOSIS — Z880 Allergy status to penicillin: Secondary | ICD-10-CM | POA: Diagnosis not present

## 2019-04-22 DIAGNOSIS — Z888 Allergy status to other drugs, medicaments and biological substances status: Secondary | ICD-10-CM | POA: Insufficient documentation

## 2019-04-22 DIAGNOSIS — Z833 Family history of diabetes mellitus: Secondary | ICD-10-CM | POA: Diagnosis not present

## 2019-04-22 DIAGNOSIS — I10 Essential (primary) hypertension: Secondary | ICD-10-CM | POA: Insufficient documentation

## 2019-04-22 DIAGNOSIS — Z881 Allergy status to other antibiotic agents status: Secondary | ICD-10-CM | POA: Insufficient documentation

## 2019-04-22 DIAGNOSIS — Z7984 Long term (current) use of oral hypoglycemic drugs: Secondary | ICD-10-CM | POA: Insufficient documentation

## 2019-04-22 DIAGNOSIS — Z8249 Family history of ischemic heart disease and other diseases of the circulatory system: Secondary | ICD-10-CM | POA: Diagnosis not present

## 2019-04-22 DIAGNOSIS — Z87891 Personal history of nicotine dependence: Secondary | ICD-10-CM | POA: Insufficient documentation

## 2019-04-22 DIAGNOSIS — Z7982 Long term (current) use of aspirin: Secondary | ICD-10-CM | POA: Insufficient documentation

## 2019-04-22 DIAGNOSIS — M1711 Unilateral primary osteoarthritis, right knee: Secondary | ICD-10-CM | POA: Diagnosis not present

## 2019-04-22 DIAGNOSIS — Z7951 Long term (current) use of inhaled steroids: Secondary | ICD-10-CM | POA: Diagnosis not present

## 2019-04-22 DIAGNOSIS — R338 Other retention of urine: Secondary | ICD-10-CM | POA: Insufficient documentation

## 2019-04-22 DIAGNOSIS — Z882 Allergy status to sulfonamides status: Secondary | ICD-10-CM | POA: Insufficient documentation

## 2019-04-22 DIAGNOSIS — E119 Type 2 diabetes mellitus without complications: Secondary | ICD-10-CM | POA: Diagnosis not present

## 2019-04-22 DIAGNOSIS — K219 Gastro-esophageal reflux disease without esophagitis: Secondary | ICD-10-CM | POA: Insufficient documentation

## 2019-04-22 DIAGNOSIS — Z79899 Other long term (current) drug therapy: Secondary | ICD-10-CM | POA: Diagnosis not present

## 2019-04-22 HISTORY — PX: XI ROBOTIC ASSISTED INGUINAL HERNIA REPAIR WITH MESH: SHX6706

## 2019-04-22 LAB — CREATININE, SERUM
Creatinine, Ser: 1.33 mg/dL — ABNORMAL HIGH (ref 0.61–1.24)
GFR calc Af Amer: 60 mL/min — ABNORMAL LOW (ref >60)
GFR calc non Af Amer: 52 mL/min — ABNORMAL LOW (ref >60)

## 2019-04-22 LAB — GLUCOSE, CAPILLARY
Glucose-Capillary: 111 mg/dL — ABNORMAL HIGH (ref 70–99)
Glucose-Capillary: 142 mg/dL — ABNORMAL HIGH (ref 70–99)

## 2019-04-22 SURGERY — REPAIR, HERNIA, INGUINAL, ROBOT-ASSISTED, LAPAROSCOPIC, USING MESH
Anesthesia: General | Site: Groin | Laterality: Left

## 2019-04-22 MED ORDER — ROCURONIUM BROMIDE 100 MG/10ML IV SOLN
INTRAVENOUS | Status: DC | PRN
  Administered 2019-04-22: 50 mg via INTRAVENOUS

## 2019-04-22 MED ORDER — OXYCODONE HCL 5 MG/5ML PO SOLN: 5.0000 mg | Freq: Once | ORAL | Status: AC | PRN

## 2019-04-22 MED ORDER — CEFAZOLIN SODIUM-DEXTROSE 2-4 GM/100ML-% IV SOLN
INTRAVENOUS | Status: AC
  Filled 2019-04-22: qty 100

## 2019-04-22 MED ORDER — MIRTAZAPINE 15 MG PO TABS: 15.0000 mg | ORAL_TABLET | Freq: Every evening | ORAL | Status: DC | PRN

## 2019-04-22 MED ORDER — MEPERIDINE HCL 50 MG/ML IJ SOLN: 6.2500 mg | INTRAMUSCULAR | Status: DC | PRN

## 2019-04-22 MED ORDER — ACETAMINOPHEN 10 MG/ML IV SOLN
INTRAVENOUS | Status: AC
  Filled 2019-04-22: qty 100

## 2019-04-22 MED ORDER — SUGAMMADEX SODIUM 200 MG/2ML IV SOLN
INTRAVENOUS | Status: AC
  Filled 2019-04-22: qty 2

## 2019-04-22 MED ORDER — LORATADINE 10 MG PO TABS
10.0000 mg | ORAL_TABLET | Freq: Every day | ORAL | Status: DC
  Administered 2019-04-23: 10 mg via ORAL
  Filled 2019-04-22: qty 1

## 2019-04-22 MED ORDER — ROCURONIUM BROMIDE 50 MG/5ML IV SOLN
INTRAVENOUS | Status: AC
  Filled 2019-04-22: qty 1

## 2019-04-22 MED ORDER — METFORMIN HCL 500 MG PO TABS
500.0000 mg | ORAL_TABLET | Freq: Every day | ORAL | Status: DC
  Administered 2019-04-23: 500 mg via ORAL
  Filled 2019-04-22: qty 1

## 2019-04-22 MED ORDER — ONDANSETRON 4 MG PO TBDP: 4.0000 mg | ORAL_TABLET | Freq: Four times a day (QID) | ORAL | Status: DC | PRN

## 2019-04-22 MED ORDER — HYDROCHLOROTHIAZIDE 25 MG PO TABS
12.5000 mg | ORAL_TABLET | Freq: Every day | ORAL | Status: DC
  Administered 2019-04-23: 12.5 mg via ORAL
  Filled 2019-04-22: qty 1

## 2019-04-22 MED ORDER — ONDANSETRON HCL 4 MG/2ML IJ SOLN: 4.0000 mg | Freq: Four times a day (QID) | INTRAMUSCULAR | Status: DC | PRN

## 2019-04-22 MED ORDER — FENTANYL CITRATE (PF) 100 MCG/2ML IJ SOLN
INTRAMUSCULAR | Status: AC
  Filled 2019-04-22: qty 2

## 2019-04-22 MED ORDER — LOSARTAN POTASSIUM 25 MG PO TABS
25.0000 mg | ORAL_TABLET | Freq: Every day | ORAL | Status: DC
  Administered 2019-04-23: 10:00:00 25 mg via ORAL
  Filled 2019-04-22: qty 1

## 2019-04-22 MED ORDER — OXYCODONE HCL 5 MG PO TABS
ORAL_TABLET | ORAL | Status: AC
  Filled 2019-04-22: qty 1

## 2019-04-22 MED ORDER — HYDROCODONE-ACETAMINOPHEN 5-325 MG PO TABS
1.0000 | ORAL_TABLET | ORAL | Status: DC | PRN
  Administered 2019-04-22 – 2019-04-23 (×2): 2 via ORAL
  Filled 2019-04-22 (×2): qty 2

## 2019-04-22 MED ORDER — TERAZOSIN HCL 5 MG PO CAPS
10.0000 mg | ORAL_CAPSULE | Freq: Every day | ORAL | Status: DC
  Administered 2019-04-22: 20:00:00 10 mg via ORAL
  Filled 2019-04-22 (×2): qty 2

## 2019-04-22 MED ORDER — ENOXAPARIN SODIUM 40 MG/0.4ML ~~LOC~~ SOLN: 40.0000 mg | Freq: Every day | SUBCUTANEOUS | Status: DC

## 2019-04-22 MED ORDER — EPHEDRINE SULFATE 50 MG/ML IJ SOLN
INTRAMUSCULAR | Status: DC | PRN
  Administered 2019-04-22 (×2): 5 mg via INTRAVENOUS

## 2019-04-22 MED ORDER — GLYCOPYRROLATE 0.2 MG/ML IJ SOLN
INTRAMUSCULAR | Status: DC | PRN
  Administered 2019-04-22: 0.2 mg via INTRAVENOUS

## 2019-04-22 MED ORDER — BUPIVACAINE HCL (PF) 0.25 % IJ SOLN
INTRAMUSCULAR | Status: AC
  Filled 2019-04-22: qty 30

## 2019-04-22 MED ORDER — ACETAMINOPHEN 10 MG/ML IV SOLN
INTRAVENOUS | Status: DC | PRN
  Administered 2019-04-22: 1000 mg via INTRAVENOUS

## 2019-04-22 MED ORDER — FINASTERIDE 5 MG PO TABS
5.0000 mg | ORAL_TABLET | Freq: Every day | ORAL | Status: DC
  Administered 2019-04-23: 5 mg via ORAL
  Filled 2019-04-22: qty 1

## 2019-04-22 MED ORDER — HYDROCODONE-ACETAMINOPHEN 5-325 MG PO TABS: 1.0000 | ORAL_TABLET | ORAL | 0 refills | Status: AC | PRN

## 2019-04-22 MED ORDER — SODIUM CHLORIDE 0.9 % IV SOLN
INTRAVENOUS | Status: DC
  Administered 2019-04-22: 12:00:00 via INTRAVENOUS
  Administered 2019-04-22: 50 mL/h via INTRAVENOUS
  Administered 2019-04-22: 20:00:00 via INTRAVENOUS

## 2019-04-22 MED ORDER — CLINDAMYCIN PHOSPHATE 900 MG/50ML IV SOLN
INTRAVENOUS | Status: AC
  Filled 2019-04-22: qty 50

## 2019-04-22 MED ORDER — BUPIVACAINE-EPINEPHRINE 0.25% -1:200000 IJ SOLN
INTRAMUSCULAR | Status: DC | PRN
  Administered 2019-04-22: 30 mL

## 2019-04-22 MED ORDER — FAMOTIDINE 20 MG PO TABS
ORAL_TABLET | ORAL | Status: AC
  Administered 2019-04-22: 20 mg
  Filled 2019-04-22: qty 1

## 2019-04-22 MED ORDER — DEXAMETHASONE SODIUM PHOSPHATE 10 MG/ML IJ SOLN
INTRAMUSCULAR | Status: AC
  Filled 2019-04-22: qty 1

## 2019-04-22 MED ORDER — SUGAMMADEX SODIUM 200 MG/2ML IV SOLN
INTRAVENOUS | Status: DC | PRN
  Administered 2019-04-22: 200 mg via INTRAVENOUS

## 2019-04-22 MED ORDER — ONDANSETRON HCL 4 MG/2ML IJ SOLN
INTRAMUSCULAR | Status: DC | PRN
  Administered 2019-04-22: 4 mg via INTRAVENOUS

## 2019-04-22 MED ORDER — MIDAZOLAM HCL 2 MG/2ML IJ SOLN
INTRAMUSCULAR | Status: AC
  Filled 2019-04-22: qty 2

## 2019-04-22 MED ORDER — PROPOFOL 10 MG/ML IV BOLUS
INTRAVENOUS | Status: DC | PRN
  Administered 2019-04-22: 150 mg via INTRAVENOUS

## 2019-04-22 MED ORDER — ACETAMINOPHEN 325 MG PO TABS: 650.0000 mg | ORAL_TABLET | Freq: Four times a day (QID) | ORAL | Status: DC | PRN

## 2019-04-22 MED ORDER — EPINEPHRINE PF 1 MG/ML IJ SOLN
INTRAMUSCULAR | Status: AC
  Filled 2019-04-22: qty 1

## 2019-04-22 MED ORDER — VITAMIN D 25 MCG (1000 UNIT) PO TABS: 1000.0000 [IU] | ORAL_TABLET | Freq: Every day | ORAL | Status: DC

## 2019-04-22 MED ORDER — MORPHINE SULFATE (PF) 4 MG/ML IV SOLN: 4.0000 mg | INTRAVENOUS | Status: DC | PRN

## 2019-04-22 MED ORDER — FENTANYL CITRATE (PF) 100 MCG/2ML IJ SOLN
INTRAMUSCULAR | Status: DC | PRN
  Administered 2019-04-22: 50 ug via INTRAVENOUS
  Administered 2019-04-22: 25 ug via INTRAVENOUS
  Administered 2019-04-22 (×2): 50 ug via INTRAVENOUS

## 2019-04-22 MED ORDER — LIDOCAINE HCL (CARDIAC) PF 100 MG/5ML IV SOSY
PREFILLED_SYRINGE | INTRAVENOUS | Status: DC | PRN
  Administered 2019-04-22: 100 mg via INTRAVENOUS

## 2019-04-22 MED ORDER — PHENYLEPHRINE HCL (PRESSORS) 10 MG/ML IV SOLN
INTRAVENOUS | Status: DC | PRN
  Administered 2019-04-22 (×4): 100 ug via INTRAVENOUS

## 2019-04-22 MED ORDER — DEXAMETHASONE SODIUM PHOSPHATE 10 MG/ML IJ SOLN
INTRAMUSCULAR | Status: DC | PRN
  Administered 2019-04-22: 10 mg via INTRAVENOUS

## 2019-04-22 MED ORDER — OXYCODONE HCL 5 MG PO TABS
5.0000 mg | ORAL_TABLET | Freq: Once | ORAL | Status: AC | PRN
  Administered 2019-04-22: 5 mg via ORAL

## 2019-04-22 MED ORDER — FENTANYL CITRATE (PF) 100 MCG/2ML IJ SOLN
25.0000 ug | INTRAMUSCULAR | Status: AC | PRN
  Administered 2019-04-22 (×6): 25 ug via INTRAVENOUS

## 2019-04-22 MED ORDER — PROPOFOL 10 MG/ML IV BOLUS
INTRAVENOUS | Status: AC
  Filled 2019-04-22: qty 20

## 2019-04-22 MED ORDER — PANTOPRAZOLE SODIUM 40 MG PO TBEC
40.0000 mg | DELAYED_RELEASE_TABLET | Freq: Every day | ORAL | Status: DC
  Administered 2019-04-23: 40 mg via ORAL
  Filled 2019-04-22: qty 1

## 2019-04-22 MED ORDER — ACETAMINOPHEN 650 MG RE SUPP: 650.0000 mg | Freq: Four times a day (QID) | RECTAL | Status: DC | PRN

## 2019-04-22 MED ORDER — ONDANSETRON HCL 4 MG/2ML IJ SOLN
INTRAMUSCULAR | Status: AC
  Filled 2019-04-22: qty 2

## 2019-04-22 MED ORDER — LIDOCAINE HCL (PF) 2 % IJ SOLN
INTRAMUSCULAR | Status: AC
  Filled 2019-04-22: qty 10

## 2019-04-22 MED ORDER — PROMETHAZINE HCL 25 MG/ML IJ SOLN: 6.2500 mg | INTRAMUSCULAR | Status: DC | PRN

## 2019-04-22 SURGICAL SUPPLY — 49 items
BAG INFUSER PRESSURE 100CC (MISCELLANEOUS) IMPLANT
BLADE SURG SZ11 CARB STEEL (BLADE) ×2 IMPLANT
CANISTER SUCT 1200ML W/VALVE (MISCELLANEOUS) ×2 IMPLANT
CHLORAPREP W/TINT 26 (MISCELLANEOUS) ×2 IMPLANT
COVER TIP SHEARS 8 DVNC (MISCELLANEOUS) ×1 IMPLANT
COVER TIP SHEARS 8MM DA VINCI (MISCELLANEOUS) ×1
COVER WAND RF STERILE (DRAPES) ×4 IMPLANT
DEFOGGER SCOPE WARMER CLEARIFY (MISCELLANEOUS) ×2 IMPLANT
DERMABOND ADVANCED (GAUZE/BANDAGES/DRESSINGS) ×1
DERMABOND ADVANCED .7 DNX12 (GAUZE/BANDAGES/DRESSINGS) ×1 IMPLANT
DRAPE 3/4 80X56 (DRAPES) ×2 IMPLANT
DRAPE ARM DVNC X/XI (DISPOSABLE) ×4 IMPLANT
DRAPE COLUMN DVNC XI (DISPOSABLE) ×1 IMPLANT
DRAPE DA VINCI XI ARM (DISPOSABLE) ×4
DRAPE DA VINCI XI COLUMN (DISPOSABLE) ×1
ELECT CAUTERY BLADE 6.4 (BLADE) IMPLANT
ELECT REM PT RETURN 9FT ADLT (ELECTROSURGICAL) ×2
ELECTRODE REM PT RTRN 9FT ADLT (ELECTROSURGICAL) ×1 IMPLANT
GLOVE BIO SURGEON STRL SZ 6.5 (GLOVE) ×4 IMPLANT
GLOVE BIOGEL PI IND STRL 6.5 (GLOVE) ×2 IMPLANT
GLOVE BIOGEL PI INDICATOR 6.5 (GLOVE) ×2
GOWN STRL REUS W/ TWL LRG LVL3 (GOWN DISPOSABLE) ×3 IMPLANT
GOWN STRL REUS W/TWL LRG LVL3 (GOWN DISPOSABLE) ×3
IRRIGATOR SUCT 8 DISP DVNC XI (IRRIGATION / IRRIGATOR) IMPLANT
IRRIGATOR SUCTION 8MM XI DISP (IRRIGATION / IRRIGATOR)
IV NS 1000ML (IV SOLUTION)
IV NS 1000ML BAXH (IV SOLUTION) IMPLANT
KIT PINK PAD W/HEAD ARE REST (MISCELLANEOUS) ×2
KIT PINK PAD W/HEAD ARM REST (MISCELLANEOUS) ×1 IMPLANT
LABEL OR SOLS (LABEL) ×2 IMPLANT
MESH 3DMAX 4X6 LT LRG (Mesh General) ×1 IMPLANT
MESH 3DMAX MID 4X6 LT LRG (Mesh General) ×1 IMPLANT
NEEDLE HYPO 22GX1.5 SAFETY (NEEDLE) ×2 IMPLANT
NEEDLE VERESS 14GA 120MM (NEEDLE) ×2 IMPLANT
OBTURATOR OPTICAL STANDARD 8MM (TROCAR) ×1
OBTURATOR OPTICAL STND 8 DVNC (TROCAR) ×1
OBTURATOR OPTICALSTD 8 DVNC (TROCAR) ×1 IMPLANT
PACK LAP CHOLECYSTECTOMY (MISCELLANEOUS) ×2 IMPLANT
PENCIL ELECTRO HAND CTR (MISCELLANEOUS) IMPLANT
SEAL CANN UNIV 5-8 DVNC XI (MISCELLANEOUS) ×3 IMPLANT
SEAL XI 5MM-8MM UNIVERSAL (MISCELLANEOUS) ×3
SOLUTION ELECTROLUBE (MISCELLANEOUS) ×2 IMPLANT
SUT DVC VLOC 3-0 CL 6 P-12 (SUTURE) IMPLANT
SUT MNCRL AB 4-0 PS2 18 (SUTURE) ×2 IMPLANT
SUT VIC AB 2-0 SH 27 (SUTURE) ×1
SUT VIC AB 2-0 SH 27XBRD (SUTURE) ×1 IMPLANT
SUT VLOC 90 S/L VL9 GS22 (SUTURE) ×8 IMPLANT
TRAY FOLEY MTR SLVR 16FR STAT (SET/KITS/TRAYS/PACK) ×2 IMPLANT
TUBING EVAC SMOKE HEATED PNEUM (TUBING) ×2 IMPLANT

## 2019-04-22 NOTE — Progress Notes (Signed)
BInder placed when patient stood up to go to chair

## 2019-04-22 NOTE — Interval H&P Note (Signed)
History and Physical Interval Note:  04/22/2019 10:57 AM  Ricky Riley  has presented today for surgery, with the diagnosis of K40.90  non recurrent unilateral inguinal hernia w/o obstruction or gangrene.  The various methods of treatment have been discussed with the patient and family. After consideration of risks, benefits and other options for treatment, the patient has consented to  Procedure(s): XI ROBOTIC Cetronia (Left) as a surgical intervention.  The patient's history has been reviewed, patient examined, no change in status, stable for surgery.  I have reviewed the patient's chart and labs.  Left groin marked in the pre procedure room. Questions were answered to the patient's satisfaction.     Herbert Pun

## 2019-04-22 NOTE — Progress Notes (Signed)
Instructions given to pts  Son, ROD, who states he will be able to stay with his dad tonight.. Pt s friend coming to drive him home

## 2019-04-22 NOTE — Anesthesia Procedure Notes (Signed)
Procedure Name: Intubation Date/Time: 04/22/2019 11:41 AM Performed by: Johnna Acosta, CRNA Pre-anesthesia Checklist: Patient identified, Emergency Drugs available, Suction available, Patient being monitored and Timeout performed Patient Re-evaluated:Patient Re-evaluated prior to induction Oxygen Delivery Method: Circle system utilized Preoxygenation: Pre-oxygenation with 100% oxygen Induction Type: IV induction Ventilation: Mask ventilation without difficulty Laryngoscope Size: Miller and 2 Grade View: Grade II Tube type: Oral Tube size: 7.5 mm Number of attempts: 1 Airway Equipment and Method: Stylet Placement Confirmation: ETT inserted through vocal cords under direct vision,  positive ETCO2 and breath sounds checked- equal and bilateral Secured at: 22 cm Tube secured with: Tape Dental Injury: Teeth and Oropharynx as per pre-operative assessment

## 2019-04-22 NOTE — Anesthesia Postprocedure Evaluation (Signed)
Anesthesia Post Note  Patient: Ricky Riley  Procedure(s) Performed: XI ROBOTIC ASSISTED INGUINAL HERNIA REPAIR WITH MESH (Left Groin)  Patient location during evaluation: PACU Anesthesia Type: General Level of consciousness: awake and alert and oriented Pain management: pain level controlled Vital Signs Assessment: post-procedure vital signs reviewed and stable Respiratory status: spontaneous breathing, nonlabored ventilation and respiratory function stable Cardiovascular status: blood pressure returned to baseline and stable Postop Assessment: no signs of nausea or vomiting Anesthetic complications: no     Last Vitals:  Vitals:   04/22/19 0810 04/22/19 1429  BP: (!) 175/85 131/64  Pulse: (!) 57 63  Resp: 18 20  Temp: (!) 36.4 C (!) 36.2 C  SpO2: 100% 100%    Last Pain:  Vitals:   04/22/19 1429  TempSrc:   PainSc: Asleep                 Shreeya Recendiz

## 2019-04-22 NOTE — Op Note (Signed)
Preoperative diagnosis: Left inguinal hernia.   Postoperative diagnosis: Left inguinal hernia.  Procedure: Robotic assisted Laparoscopic Transabdominal preperitoneal laparoscopic (TAPP) repair of left inguinal hernia.  Anesthesia: GETA  Surgeon: Dr. Windell Moment  Wound Classification: Clean  Indications:  Patient is a 78 y.o. male developed a symptomatic left inguinal hernia. Repair was indicated.  Findings: 1. Left indirect and direc Inguinal hernia identified 2. Vas deferens and cord structures identified and preserved 3. Bard 3D Max mesh used for repair 4. Extraperitoneal reservoir identified and preserved. 5. Adequate hemostasis.   Description of procedure: The patient was taken to the operating room and the correct side of surgery was verified. The patient was placed supine with arms tucked at the sides. After obtaining adequate anesthesia, the patient's abdomen was prepped and draped in standard sterile fashion. The patient was placed in the Trendelenburg position. A time-out was completed verifying correct patient, procedure, site, positioning, and implant(s) and/or special equipment prior to beginning this procedure. A Veress needle was placed at the umbilicus and pneumoperitoneum created with insufflation of carbon dioxide to 15 mmHg. After the Veress needle was removed, an 8-mm trocar was placed on epigastric area and the 30 angled laparoscope inserted. Two 8-mm trocars were then placed lateral to the rectus sheath under direct visualization. Both inguinal regions were inspected and the median umbilical ligament, medial umbilical ligament, and lateral umbilical fold were identified.  The robotic arms were docked. The robotic scope was inserted and the right groin area anatomy targeted.  The peritoneum was incised with scissors along a line 5 cm above the superior edge of the hernia defect, extending from the median umbilical ligament to the anterior superior iliac spine. The  peritoneal flap was mobilized inferiorly using blunt and sharp dissection. The inferior epigastric vessels were exposed. The reservoir from the penile implant was identified. There was abundant scar tissue around the implant. This make the dissection very difficult and time consuming to be able to identify the pubic symphysis. Cooper's ligament was dissected to its junction with the iliac vein. The dissection was continued inferiorly to the iliopubic tract, with care taken to avoid injury to the femoral branch of the genitofemoral nerve and the lateral femoral cutaneous nerve. The cord structures were parietalized. The hernia was identified and reduced by gentle traction.  The indirect hernia sac was noted mobilized from the cord structures and reduced into the peritoneal cavity.  A large piece of mesh was rolled longitudinally into a compact cylinder and passed through a trocar. The cylinder was placed along the inferior aspect of the working space and unrolled into place to completely cover the direct, indirect, and femoral spaces. Again more dissection was needed to be done around the reservoir to be able to fit the mesh properly. A lot of hard scar tissue was needed to be dissected until the mesh was flat. The mesh was secured into place superiorly to the anterior abdominal wall and inferiorly and medially to Cooper's ligament with absorbable sutures. Care was taken to avoid the inferolateral triangles containing the iliac vessels and genital nerves. The peritoneal flap was closed over the mesh and secured with suture in similar positions of safety. After ensuring adequate hemostasis, the trocars were removed and the pneumoperitoneum allowed to escape. The trocar incisions were closed using monocryl and skin adhesive dressings applied.  The patient tolerated the procedure well and was taken to the postanesthesia care unit in stable condition.   Specimen: None  Complications: None  Estimated Blood Loss:  5 mL

## 2019-04-22 NOTE — Transfer of Care (Signed)
Immediate Anesthesia Transfer of Care Note  Patient: Ricky Riley  Procedure(s) Performed: XI ROBOTIC ASSISTED INGUINAL HERNIA REPAIR WITH MESH (Left Groin)  Patient Location: PACU  Anesthesia Type:General  Level of Consciousness: awake and alert   Airway & Oxygen Therapy: Patient Spontanous Breathing and Patient connected to face mask oxygen  Post-op Assessment: Report given to RN and Post -op Vital signs reviewed and stable  Post vital signs: Reviewed and stable  Last Vitals:  Vitals Value Taken Time  BP 131/64 04/22/19 1429  Temp 36.2 C 04/22/19 1429  Pulse 64 04/22/19 1430  Resp 9 04/22/19 1430  SpO2 100 % 04/22/19 1430  Vitals shown include unvalidated device data.  Last Pain:  Vitals:   04/22/19 0810  TempSrc: Tympanic  PainSc: 0-No pain         Complications: No apparent anesthesia complications

## 2019-04-22 NOTE — Anesthesia Post-op Follow-up Note (Signed)
Anesthesia QCDR form completed.        

## 2019-04-22 NOTE — Progress Notes (Signed)
Report given to [facility], pt to room 217, transferred by Loren Racer RN

## 2019-04-22 NOTE — Discharge Instructions (Signed)
AMBULATORY SURGERY  DISCHARGE INSTRUCTIONS   1) The drugs that you were given will stay in your system until tomorrow so for the next 24 hours you should not:  A) Drive an automobile B) Make any legal decisions C) Drink any alcoholic beverage   2) You may resume regular meals tomorrow.  Today it is better to start with liquids and gradually work up to solid foods.  You may eat anything you prefer, but it is better to start with liquids, then soup and crackers, and gradually work up to solid foods.   3) Please notify your doctor immediately if you have any unusual bleeding, trouble breathing, redness and pain at the surgery site, drainage, fever, or pain not relieved by medication. 4)   5) Your post-operative visit with Dr.                                     is: Date:                        Time:    Please call to schedule your post-operative visit.  6) Additional Instructions:     Diet: Resume home heart healthy regular diet.   Activity: No heavy lifting >20 pounds (children, pets, laundry, garbage) or strenuous activity until follow-up, but light activity and walking are encouraged. Do not drive or drink alcohol if taking narcotic pain medications.  Wound care: May shower with soapy water and pat dry (do not rub incisions), but no baths or submerging incision underwater until follow-up. (no swimming)   Medications: Resume all home medications. For mild to moderate pain: acetaminophen (Tylenol) or ibuprofen (if no kidney disease). Combining Tylenol with alcohol can substantially increase your risk of causing liver disease. Narcotic pain medications, if prescribed, can be used for severe pain, though may cause nausea, constipation, and drowsiness. Do not combine Tylenol and Norco within a 6 hour period as Norco contains Tylenol. If you do not need the narcotic pain medication, you do not need to fill the prescription.  Call office ([phone]) at any time if any questions,  worsening pain, fevers/chills, bleeding, drainage from incision site, or other concerns.  

## 2019-04-22 NOTE — Anesthesia Preprocedure Evaluation (Signed)
Anesthesia Evaluation  Patient identified by MRN, date of birth, ID band Patient awake    Reviewed: Allergy & Precautions, NPO status , Patient's Chart, lab work & pertinent test results  History of Anesthesia Complications Negative for: history of anesthetic complications  Airway Mallampati: III  TM Distance: >3 FB Neck ROM: Full    Dental no notable dental hx.    Pulmonary sleep apnea and Continuous Positive Airway Pressure Ventilation , neg COPD, former smoker,    breath sounds clear to auscultation- rhonchi (-) wheezing      Cardiovascular hypertension, Pt. on medications (-) CAD, (-) Past MI, (-) Cardiac Stents and (-) CABG  Rhythm:Regular Rate:Normal - Systolic murmurs and - Diastolic murmurs Echo [id number]: MILD LV SYSTOLIC DYSFUNCTION  WITH MODERATE LVH NORMAL RIGHT VENTRICULAR SYSTOLIC FUNCTION MILD VALVULAR REGURGITATION  NO VALVULAR STENOSIS MUILD AR, MR, TR, PR EF 45% LVH: MODERATE LVH Dias.FxClass: (Grade 1) relaxation abnormal, E/A reversal Aortic: MILD AR Mitral: MILD MR Tricuspid: MILD TR   Neuro/Psych  Headaches, neg Seizures negative psych ROS   GI/Hepatic Neg liver ROS, GERD  ,  Endo/Other  diabetes, Oral Hypoglycemic Agents  Renal/GU negative Renal ROS     Musculoskeletal  (+) Arthritis ,   Abdominal (+) - obese,   Peds  Hematology negative hematology ROS (+)   Anesthesia Other Findings Past Medical History: No date: Arthritis No date: Diabetes mellitus without complication (HCC) No date: Dysrhythmia No date: GERD (gastroesophageal reflux disease) No date: Headache No date: Hypertension No date: Sleep apnea     Comment:  CPAP    Reproductive/Obstetrics                             Anesthesia Physical Anesthesia Plan  ASA: III  Anesthesia Plan: General   Post-op Pain Management:    Induction: Intravenous  PONV Risk Score and Plan: 1 and Dexamethasone  and Ondansetron  Airway Management Planned: Oral ETT  Additional Equipment:   Intra-op Plan:   Post-operative Plan: Extubation in OR  Informed Consent: I have reviewed the patients History and Physical, chart, labs and discussed the procedure including the risks, benefits and alternatives for the proposed anesthesia with the patient or authorized representative who has indicated his/her understanding and acceptance.     Dental advisory given  Plan Discussed with: CRNA and Anesthesiologist  Anesthesia Plan Comments:         Anesthesia Quick Evaluation

## 2019-04-22 NOTE — Brief Op Note (Signed)
04/22/2019  4:57 PM  PATIENT:  Ricky Riley  78 y.o. 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  Unable to discharge patient on her adequate care at home.   Delay start of Pharmacological VTE agent (>24hrs) due to surgical blood loss or risk of bleeding: no

## 2019-04-22 NOTE — Progress Notes (Signed)
Pt will spend the night , son and friend updated, pt stating he needs to stay that he doesn't feel like being discharged. Dr Windell Moment updated and is putting in admit orders at this time

## 2019-04-23 ENCOUNTER — Encounter: Payer: Self-pay | Admitting: General Surgery

## 2019-04-23 DIAGNOSIS — K4090 Unilateral inguinal hernia, without obstruction or gangrene, not specified as recurrent: Secondary | ICD-10-CM | POA: Diagnosis not present

## 2019-04-23 MED ORDER — TAMSULOSIN HCL 0.4 MG PO CAPS: 0.4000 mg | ORAL_CAPSULE | Freq: Every day | ORAL | 0 refills | Status: AC

## 2019-04-23 MED ORDER — TAMSULOSIN HCL 0.4 MG PO CAPS
0.4000 mg | ORAL_CAPSULE | Freq: Every day | ORAL | Status: DC
  Administered 2019-04-23: 0.4 mg via ORAL
  Filled 2019-04-23: qty 1

## 2019-04-23 MED ORDER — ONDANSETRON 4 MG PO TBDP: 4.0000 mg | ORAL_TABLET | Freq: Four times a day (QID) | ORAL | 0 refills | Status: DC | PRN

## 2019-04-23 NOTE — Care Management Obs Status (Signed)
MEDICARE OBSERVATION STATUS NOTIFICATION   Patient Details  Name: ELIE MCELROY MRN: [id number] Date of Birth: November 12, 1940   Medicare Observation Status Notification Given:  No(admitted obervation less thand 24 hours)    Beverly Sessions, RN 04/23/2019, 3:49 PM

## 2019-04-23 NOTE — Discharge Summary (Signed)
Patient ID: KAGAN TIBBITTS MRN: [id number] DOB/AGE: [birth year] 78 y.o.  Admit date: 04/22/2019 Discharge date: 04/23/2019   Discharge Diagnoses:  Active Problems:   Left inguinal hernia   Procedures: Robotic assisted left inguinal hernia  Hospital Course: Patient underwent robotic assisted laparoscopic left inguinal hernia.  She developed urinary retention.  Foley catheter needed to be placed.  Patient evaluated by urology.  Recommendation was to stay with the Foley for 10 days.  They will follow patient in the clinic for removal and voiding trial.  Otherwise pain has been controlled.  Patient tolerating diet.  Consults: Urology  Disposition: Discharge disposition: 01-Home or Self Care       Discharge Instructions    Diet - low sodium heart healthy   Complete by: As directed    Diet - low sodium heart healthy   Complete by: As directed    Increase activity slowly   Complete by: As directed    Increase activity slowly   Complete by: As directed      Allergies as of 04/23/2019      Reactions   Ciprofloxacin Hives   Penicillins Hives   Did it involve swelling of the face/tongue/throat, SOB, or low BP? No Did it involve sudden or severe rash/hives, skin peeling, or any reaction on the inside of your mouth or nose? Unknown Did you need to seek medical attention at a hospital or doctor's office? No When did it last happen?30-40 years ago If all above answers are "NO", may proceed with cephalosporin use.   Sulfa Antibiotics Hives   Sulfasalazine Hives      Medication List    TAKE these medications   acetaminophen 500 MG tablet Commonly known as: TYLENOL Take 500 mg by mouth every 6 (six) hours as needed.   aspirin EC 81 MG tablet Take 81 mg by mouth daily.   cetirizine 10 MG tablet Commonly known as: ZYRTEC Take 10 mg by mouth daily as needed for allergies.   cholecalciferol 1000 units tablet Commonly known as: VITAMIN D Take 1,000 Units by mouth daily  after supper.   finasteride 5 MG tablet Commonly known as: PROSCAR Take 5 mg by mouth daily.   hydrochlorothiazide 25 MG tablet Commonly known as: HYDRODIURIL Take 12.5 mg by mouth daily.   HYDROcodone-acetaminophen 5-325 MG tablet Commonly known as: Norco Take 1 tablet by mouth every 4 (four) hours as needed for up to 3 days for moderate pain.   losartan 25 MG tablet Commonly known as: COZAAR Take 25 mg by mouth daily.   metFORMIN 500 MG tablet Commonly known as: GLUCOPHAGE Take 500 mg by mouth daily with breakfast.   mirtazapine 15 MG tablet Commonly known as: REMERON Take 15 mg by mouth at bedtime as needed (sleep).   omeprazole 20 MG capsule Commonly known as: PRILOSEC Take 20 mg by mouth daily before breakfast.   ondansetron 4 MG disintegrating tablet Commonly known as: ZOFRAN-ODT Take 1 tablet (4 mg total) by mouth every 6 (six) hours as needed for nausea.   sodium chloride 0.65 % Soln nasal spray Commonly known as: OCEAN Place 1 spray into both nostrils as needed for congestion.   tamsulosin 0.4 MG Caps capsule Commonly known as: FLOMAX Take 1 capsule (0.4 mg total) by mouth daily after supper for 20 days.   terazosin 10 MG capsule Commonly known as: HYTRIN Take 10 mg by mouth at bedtime.      Follow-up Information    Herbert Pun, MD Follow up on  05/12/2019.   Specialty: General Surgery Why: at 8:15 Contact information: [place] [address] [phone]

## 2019-04-23 NOTE — Consult Note (Signed)
     Brief Urology Consult  I have been asked to see the patient by Dr. Windell Moment, for evaluation and management of acute urinary retention.  Ricky Riley is a 78 y.o. male who underwent left robotic inguinal hernia repair with mesh with Dr. Windell Moment yesterday.  He subsequently developed urinary retention in the PACU.  Foley catheter placed and remains in place today, draining clear yellow urine.  He has a history of urinary retention, s/p TURP at [facility] several years ago.  He still follows with them on an annual basis.  He prefers to complete voiding trial in our clinic.  Recommendations: -Start tamsulosin 0.4 mg daily -Discharge with Foley catheter, with plans for voiding trial in our office in 10 to 14 days.(Scheduling in progress)  Debroah Loop, PA-C  04/23/19 1:07 PM

## 2019-04-23 NOTE — Progress Notes (Signed)
Pt discharged per MD order. IV removed. Discharge instructions reviewed with pt. Pt sent with both a leg bag and an overnight drainage bag for his catheter. Pt verbalized understanding with all questions answered to pt satisfaction. Pt taken to car in wheelchair by staff.

## 2019-05-05 ENCOUNTER — Encounter: Payer: Self-pay | Admitting: Physician Assistant

## 2019-05-05 ENCOUNTER — Other Ambulatory Visit: Payer: Self-pay

## 2019-05-05 ENCOUNTER — Ambulatory Visit: Payer: Medicare Other | Admitting: Physician Assistant

## 2019-05-05 ENCOUNTER — Ambulatory Visit (INDEPENDENT_AMBULATORY_CARE_PROVIDER_SITE_OTHER): Payer: Medicare Other | Admitting: Physician Assistant

## 2019-05-05 VITALS — BP 144/86 | HR 74 | Ht 75.0 in | Wt 190.0 lb

## 2019-05-05 DIAGNOSIS — R338 Other retention of urine: Secondary | ICD-10-CM

## 2019-05-05 LAB — BLADDER SCAN AMB NON-IMAGING: Scan Result: 18

## 2019-05-05 NOTE — Progress Notes (Signed)
Fill and Pull Catheter Removal  Patient is present today for a catheter removal.  Patient was cleaned and prepped in a sterile fashion 232ml of sterile saline was instilled into the bladder when the patient felt the urge to urinate. 61ml of water was then drained from the balloon.  A 14FR coud foley cath was removed from the bladder no complications were noted .  Patient as then given some time to void on their own.  Patient can void  238ml on their own after some time.  Patient tolerated well.  Performed by: Debroah Loop, PA-C   Follow up/ Additional notes: Patient reports a remote history of recurrent urinary retention requiring catheterization.  He underwent TURP for management of this approximately 10 to 12 years ago.  He reports recent voiding issues including nocturia x3-4.  He does have OSA and uses CPAP intermittently. On finasteride 5mg  daily. Patient to follow up this afternoon for PVR.  Afternoon Evaluation  Results for orders placed or performed in visit on 05/05/19  Bladder Scan (Post Void Residual) in office  Result Value Ref Range   Scan Result 18    PVR WNL.  No need for further intervention at this time.  I recommended the patient to continue his prescribed tamsulosin, with approximately 8 doses remaining.  I advised him to contact his urology office at [facility] to notify them of his recent episode of retention.  He expressed understanding.  I counseled the patient on signs and symptoms of urinary retention, including lower abdominal pain, lumbar pain, abdominal distention, and the inability to urinate.  I advised him to contact the office for assistance if he develops these symptoms during routine office hours, 8 AM to 5 PM Monday through Friday.  If outside those hours, I advised him to proceed to the emergency department. He expressed understanding.

## 2019-11-27 DIAGNOSIS — K219 Gastro-esophageal reflux disease without esophagitis: Secondary | ICD-10-CM | POA: Insufficient documentation

## 2019-12-12 ENCOUNTER — Encounter: Payer: Self-pay | Admitting: Emergency Medicine

## 2019-12-12 ENCOUNTER — Emergency Department: Payer: Medicare Other

## 2019-12-12 ENCOUNTER — Other Ambulatory Visit: Payer: Self-pay

## 2019-12-12 ENCOUNTER — Inpatient Hospital Stay
Admission: EM | Admit: 2019-12-12 | Discharge: 2019-12-14 | DRG: 194 | Disposition: A | Discharge status: Home / Self Care | Payer: Medicare Other | Attending: Internal Medicine | Admitting: Internal Medicine

## 2019-12-12 DIAGNOSIS — N401 Enlarged prostate with lower urinary tract symptoms: Secondary | ICD-10-CM | POA: Diagnosis present

## 2019-12-12 DIAGNOSIS — E1129 Type 2 diabetes mellitus with other diabetic kidney complication: Secondary | ICD-10-CM | POA: Diagnosis present

## 2019-12-12 DIAGNOSIS — J181 Lobar pneumonia, unspecified organism: Secondary | ICD-10-CM | POA: Diagnosis not present

## 2019-12-12 DIAGNOSIS — Z833 Family history of diabetes mellitus: Secondary | ICD-10-CM

## 2019-12-12 DIAGNOSIS — K219 Gastro-esophageal reflux disease without esophagitis: Secondary | ICD-10-CM | POA: Diagnosis present

## 2019-12-12 DIAGNOSIS — Z79899 Other long term (current) drug therapy: Secondary | ICD-10-CM

## 2019-12-12 DIAGNOSIS — I129 Hypertensive chronic kidney disease with stage 1 through stage 4 chronic kidney disease, or unspecified chronic kidney disease: Secondary | ICD-10-CM | POA: Diagnosis present

## 2019-12-12 DIAGNOSIS — R3129 Other microscopic hematuria: Secondary | ICD-10-CM | POA: Diagnosis present

## 2019-12-12 DIAGNOSIS — I10 Essential (primary) hypertension: Secondary | ICD-10-CM

## 2019-12-12 DIAGNOSIS — R778 Other specified abnormalities of plasma proteins: Secondary | ICD-10-CM | POA: Diagnosis not present

## 2019-12-12 DIAGNOSIS — N1831 Chronic kidney disease, stage 3a: Secondary | ICD-10-CM | POA: Diagnosis present

## 2019-12-12 DIAGNOSIS — Z880 Allergy status to penicillin: Secondary | ICD-10-CM

## 2019-12-12 DIAGNOSIS — E1122 Type 2 diabetes mellitus with diabetic chronic kidney disease: Secondary | ICD-10-CM | POA: Diagnosis present

## 2019-12-12 DIAGNOSIS — Z7984 Long term (current) use of oral hypoglycemic drugs: Secondary | ICD-10-CM

## 2019-12-12 DIAGNOSIS — R7401 Elevation of levels of liver transaminase levels: Secondary | ICD-10-CM | POA: Diagnosis present

## 2019-12-12 DIAGNOSIS — I248 Other forms of acute ischemic heart disease: Secondary | ICD-10-CM | POA: Diagnosis present

## 2019-12-12 DIAGNOSIS — Z882 Allergy status to sulfonamides status: Secondary | ICD-10-CM

## 2019-12-12 DIAGNOSIS — Z20822 Contact with and (suspected) exposure to covid-19: Secondary | ICD-10-CM | POA: Diagnosis present

## 2019-12-12 DIAGNOSIS — Z841 Family history of disorders of kidney and ureter: Secondary | ICD-10-CM

## 2019-12-12 DIAGNOSIS — E860 Dehydration: Secondary | ICD-10-CM | POA: Diagnosis present

## 2019-12-12 DIAGNOSIS — N400 Enlarged prostate without lower urinary tract symptoms: Secondary | ICD-10-CM | POA: Diagnosis present

## 2019-12-12 DIAGNOSIS — D649 Anemia, unspecified: Secondary | ICD-10-CM | POA: Diagnosis present

## 2019-12-12 DIAGNOSIS — Z8249 Family history of ischemic heart disease and other diseases of the circulatory system: Secondary | ICD-10-CM

## 2019-12-12 DIAGNOSIS — N179 Acute kidney failure, unspecified: Secondary | ICD-10-CM | POA: Diagnosis not present

## 2019-12-12 DIAGNOSIS — J189 Pneumonia, unspecified organism: Secondary | ICD-10-CM | POA: Diagnosis not present

## 2019-12-12 DIAGNOSIS — Z87891 Personal history of nicotine dependence: Secondary | ICD-10-CM

## 2019-12-12 DIAGNOSIS — G4733 Obstructive sleep apnea (adult) (pediatric): Secondary | ICD-10-CM | POA: Diagnosis present

## 2019-12-12 DIAGNOSIS — F329 Major depressive disorder, single episode, unspecified: Secondary | ICD-10-CM | POA: Diagnosis present

## 2019-12-12 LAB — PROCALCITONIN: Procalcitonin: <0.1 ng/mL

## 2019-12-12 LAB — CBC WITH DIFFERENTIAL/PLATELET
Abs Immature Granulocytes: 0.01 10*3/uL (ref 0.00–0.07)
Basophils Absolute: 0 10*3/uL (ref 0.0–0.1)
Basophils Relative: 0 %
Eosinophils Absolute: 0.1 10*3/uL (ref 0.0–0.5)
Eosinophils Relative: 1 %
HCT: 34 % — ABNORMAL LOW (ref 39.0–52.0)
Hemoglobin: 11.2 g/dL — ABNORMAL LOW (ref 13.0–17.0)
Immature Granulocytes: 0 %
Lymphocytes Relative: 13 %
Lymphs Abs: 0.9 10*3/uL (ref 0.7–4.0)
MCH: 26.4 pg (ref 26.0–34.0)
MCHC: 32.9 g/dL (ref 30.0–36.0)
MCV: 80 fL (ref 80.0–100.0)
Monocytes Absolute: 1.1 10*3/uL — ABNORMAL HIGH (ref 0.1–1.0)
Monocytes Relative: 16 %
Neutro Abs: 4.7 10*3/uL (ref 1.7–7.7)
Neutrophils Relative %: 70 %
Platelets: 181 10*3/uL (ref 150–400)
RBC: 4.25 MIL/uL (ref 4.22–5.81)
RDW: 13.2 % (ref 11.5–15.5)
WBC: 6.8 10*3/uL (ref 4.0–10.5)
nRBC: 0 % (ref 0.0–0.2)

## 2019-12-12 LAB — URINALYSIS, COMPLETE (UACMP) WITH MICROSCOPIC
Bacteria, UA: NONE SEEN
Bilirubin Urine: NEGATIVE
Glucose, UA: NEGATIVE mg/dL
Ketones, ur: NEGATIVE mg/dL
Leukocytes,Ua: NEGATIVE
Nitrite: NEGATIVE
Protein, ur: 30 mg/dL — AB
Specific Gravity, Urine: 1.019 (ref 1.005–1.030)
pH: 5 (ref 5.0–8.0)

## 2019-12-12 LAB — COMPREHENSIVE METABOLIC PANEL
ALT: 11 U/L (ref 0–44)
AST: 17 U/L (ref 15–41)
Albumin: 3.6 g/dL (ref 3.5–5.0)
Alkaline Phosphatase: 51 U/L (ref 38–126)
Anion gap: 10 (ref 5–15)
BUN: 24 mg/dL — ABNORMAL HIGH (ref 8–23)
CO2: 22 mmol/L (ref 22–32)
Calcium: 9.8 mg/dL (ref 8.9–10.3)
Chloride: 99 mmol/L (ref 98–111)
Creatinine, Ser: 1.59 mg/dL — ABNORMAL HIGH (ref 0.61–1.24)
GFR calc Af Amer: 48 mL/min — ABNORMAL LOW (ref >60)
GFR calc non Af Amer: 41 mL/min — ABNORMAL LOW (ref >60)
Glucose, Bld: 143 mg/dL — ABNORMAL HIGH (ref 70–99)
Potassium: 3.6 mmol/L (ref 3.5–5.1)
Sodium: 131 mmol/L — ABNORMAL LOW (ref 135–145)
Total Bilirubin: 0.7 mg/dL (ref 0.3–1.2)
Total Protein: 7.6 g/dL (ref 6.5–8.1)

## 2019-12-12 LAB — LACTIC ACID, PLASMA: Lactic Acid, Venous: 0.9 mmol/L (ref 0.5–1.9)

## 2019-12-12 LAB — TROPONIN I (HIGH SENSITIVITY)
Troponin I (High Sensitivity): 24 ng/L — ABNORMAL HIGH (ref <18)
Troponin I (High Sensitivity): 21 ng/L — ABNORMAL HIGH (ref <18)
Troponin I (High Sensitivity): 22 ng/L — ABNORMAL HIGH (ref <18)
Troponin I (High Sensitivity): 20 ng/L — ABNORMAL HIGH (ref <18)
Troponin I (High Sensitivity): 18 ng/L — ABNORMAL HIGH (ref <18)

## 2019-12-12 LAB — BRAIN NATRIURETIC PEPTIDE: B Natriuretic Peptide: 115.4 pg/mL — ABNORMAL HIGH (ref 0.0–100.0)

## 2019-12-12 LAB — GLUCOSE, CAPILLARY
Glucose-Capillary: 114 mg/dL — ABNORMAL HIGH (ref 70–99)
Glucose-Capillary: 107 mg/dL — ABNORMAL HIGH (ref 70–99)
Glucose-Capillary: 117 mg/dL — ABNORMAL HIGH (ref 70–99)

## 2019-12-12 LAB — SARS CORONAVIRUS 2 BY RT PCR (HOSPITAL ORDER, PERFORMED IN ~~LOC~~ HOSPITAL LAB): SARS Coronavirus 2: NEGATIVE

## 2019-12-12 MED ORDER — DM-GUAIFENESIN ER 30-600 MG PO TB12: 1.0000 | ORAL_TABLET | Freq: Two times a day (BID) | ORAL | Status: DC | PRN

## 2019-12-12 MED ORDER — ENOXAPARIN SODIUM 40 MG/0.4ML ~~LOC~~ SOLN
40.0000 mg | SUBCUTANEOUS | Status: DC
  Administered 2019-12-12 – 2019-12-13 (×2): 40 mg via SUBCUTANEOUS
  Filled 2019-12-12 (×2): qty 0.4

## 2019-12-12 MED ORDER — SALINE SPRAY 0.65 % NA SOLN
1.0000 | NASAL | Status: DC | PRN
  Filled 2019-12-12: qty 44

## 2019-12-12 MED ORDER — VITAMIN D 25 MCG (1000 UNIT) PO TABS
1000.0000 [IU] | ORAL_TABLET | Freq: Every day | ORAL | Status: DC
  Administered 2019-12-12 – 2019-12-14 (×3): 1000 [IU] via ORAL
  Filled 2019-12-12 (×3): qty 1

## 2019-12-12 MED ORDER — IOHEXOL 350 MG/ML SOLN
75.0000 mL | Freq: Once | INTRAVENOUS | Status: AC | PRN
  Administered 2019-12-12: 75 mL via INTRAVENOUS

## 2019-12-12 MED ORDER — SODIUM CHLORIDE 0.9 % IV SOLN: INTRAVENOUS | Status: DC

## 2019-12-12 MED ORDER — INSULIN ASPART 100 UNIT/ML ~~LOC~~ SOLN: 0.0000 [IU] | Freq: Every day | SUBCUTANEOUS | Status: DC

## 2019-12-12 MED ORDER — LORATADINE 10 MG PO TABS
10.0000 mg | ORAL_TABLET | Freq: Every day | ORAL | Status: DC
  Administered 2019-12-13 – 2019-12-14 (×2): 10 mg via ORAL
  Filled 2019-12-12 (×2): qty 1

## 2019-12-12 MED ORDER — SODIUM CHLORIDE 0.9 % IV SOLN
100.0000 mg | Freq: Two times a day (BID) | INTRAVENOUS | Status: DC
  Administered 2019-12-13 – 2019-12-14 (×4): 100 mg via INTRAVENOUS
  Filled 2019-12-12 (×6): qty 100

## 2019-12-12 MED ORDER — SODIUM CHLORIDE 0.9 % IV BOLUS
500.0000 mL | Freq: Once | INTRAVENOUS | Status: AC
  Administered 2019-12-12: 500 mL via INTRAVENOUS

## 2019-12-12 MED ORDER — PANTOPRAZOLE SODIUM 40 MG PO TBEC
40.0000 mg | DELAYED_RELEASE_TABLET | Freq: Every day | ORAL | Status: DC
  Administered 2019-12-13 – 2019-12-14 (×2): 40 mg via ORAL
  Filled 2019-12-12 (×3): qty 1

## 2019-12-12 MED ORDER — TERAZOSIN HCL 5 MG PO CAPS
10.0000 mg | ORAL_CAPSULE | Freq: Every day | ORAL | Status: DC
  Administered 2019-12-12 – 2019-12-13 (×2): 10 mg via ORAL
  Filled 2019-12-12 (×3): qty 2

## 2019-12-12 MED ORDER — INSULIN ASPART 100 UNIT/ML ~~LOC~~ SOLN
0.0000 [IU] | Freq: Three times a day (TID) | SUBCUTANEOUS | Status: DC
  Administered 2019-12-14: 1 [IU] via SUBCUTANEOUS
  Filled 2019-12-12: qty 1

## 2019-12-12 MED ORDER — ONDANSETRON HCL 4 MG/2ML IJ SOLN: 4.0000 mg | Freq: Three times a day (TID) | INTRAMUSCULAR | Status: DC | PRN

## 2019-12-12 MED ORDER — HYDRALAZINE HCL 20 MG/ML IJ SOLN
5.0000 mg | INTRAMUSCULAR | Status: DC | PRN
  Filled 2019-12-12: qty 0.25

## 2019-12-12 MED ORDER — FINASTERIDE 5 MG PO TABS
5.0000 mg | ORAL_TABLET | Freq: Every day | ORAL | Status: DC
  Administered 2019-12-12 – 2019-12-14 (×3): 5 mg via ORAL
  Filled 2019-12-12 (×3): qty 1

## 2019-12-12 MED ORDER — SODIUM CHLORIDE 0.9 % IV SOLN
100.0000 mg | Freq: Once | INTRAVENOUS | Status: AC
  Administered 2019-12-12: 100 mg via INTRAVENOUS
  Filled 2019-12-12: qty 100

## 2019-12-12 MED ORDER — MIRTAZAPINE 15 MG PO TABS: 15.0000 mg | ORAL_TABLET | Freq: Every evening | ORAL | Status: DC | PRN

## 2019-12-12 MED ORDER — SODIUM CHLORIDE 0.9 % IV SOLN
1.0000 g | INTRAVENOUS | Status: DC
  Administered 2019-12-13 – 2019-12-14 (×2): 1 g via INTRAVENOUS
  Filled 2019-12-12: qty 1
  Filled 2019-12-12: qty 10
  Filled 2019-12-12: qty 1

## 2019-12-12 MED ORDER — SODIUM CHLORIDE 0.9 % IV SOLN
1.0000 g | Freq: Once | INTRAVENOUS | Status: AC
  Administered 2019-12-12: 1 g via INTRAVENOUS
  Filled 2019-12-12: qty 10

## 2019-12-12 MED ORDER — ALBUTEROL SULFATE (2.5 MG/3ML) 0.083% IN NEBU
2.5000 mg | INHALATION_SOLUTION | RESPIRATORY_TRACT | Status: DC | PRN
  Administered 2019-12-12: 2.5 mg via RESPIRATORY_TRACT
  Filled 2019-12-12: qty 3

## 2019-12-12 MED ORDER — ACETAMINOPHEN 325 MG PO TABS
650.0000 mg | ORAL_TABLET | Freq: Four times a day (QID) | ORAL | Status: DC | PRN
  Administered 2019-12-14: 650 mg via ORAL
  Filled 2019-12-12: qty 2

## 2019-12-12 NOTE — H&P (Signed)
History and Physical    ARIC JOST IRJ:[id number] DOB: [birth year] DOA: 12/12/2019  Referring MD/NP/PA:   PCP: Baxter Hire, MD   Patient coming from:  The patient is coming from home.  At baseline, pt is independent for most of ADL.        Chief Complaint: Cough, shortness breath, fever and chills  HPI: DAEKWON BESWICK is a 79 y.o. male with medical history significant of hypertension, diabetes mellitus, GERD, depression, BPH, CKD-3, who presents with cough, shortness of breath, fever and chills.  Patient states that his symptoms has been going on for about 1 week, including cough, shortness of breath, fever and chills.  He coughs up little mucus.  Denies chest pain.  He was seen by PCP and started him on azithromycin in the past 4 days for possible sinusitis, but without any improvement.  Patient denies nausea, vomiting, diarrhea, abdominal pain, symptoms of UTI or unilateral weakness.  ED Course: pt was found to have negative COVID-19 PCR, WBC 6.8, lactic acid of 0.9, troponin 24 -->21, BNP 115, negative urinalysis, worsening renal function, temperature 99, blood pressure 137/75, heart rate 55, RR 18, oxygen saturation 95% on room air.  Chest x-ray showed minimal right pleural effusion.  CT angiogram is negative for PE, but showed left lower lobe pneumonia.  Patient is placed on MedSurg bed for observation   Review of Systems:   General: has fevers, chills, no body weight gain, has poor appetite, has fatigue HEENT: no blurry vision, hearing changes or sore throat Respiratory: has dyspnea, coughing, no wheezing CV: no chest pain, no palpitations GI: no nausea, vomiting, abdominal pain, diarrhea, constipation GU: no dysuria, burning on urination, increased urinary frequency, hematuria  Ext: no leg edema Neuro: no unilateral weakness, numbness, or tingling, no vision change or hearing loss Skin: no rash, no skin tear. MSK: No muscle spasm, no deformity, no limitation of range of  movement in spin Heme: No easy bruising.  Travel history: No recent long distant travel.  Allergy:  Allergies  Allergen Reactions  . Ciprofloxacin Hives  . Penicillins Hives    Did it involve swelling of the face/tongue/throat, SOB, or low BP? No Did it involve sudden or severe rash/hives, skin peeling, or any reaction on the inside of your mouth or nose? Unknown Did you need to seek medical attention at a hospital or doctor's office? No When did it last happen?30-40 years ago If all above answers are "NO", may proceed with cephalosporin use.   . Sulfa Antibiotics Hives  . Sulfasalazine Hives    Past Medical History:  Diagnosis Date  . Arthritis   . Diabetes mellitus without complication ([place])   . Dysrhythmia   . GERD (gastroesophageal reflux disease)   . Headache   . Hypertension   . Sleep apnea    CPAP     Past Surgical History:  Procedure Laterality Date  . ARTERY BIOPSY Left 03/02/2015   Procedure: BIOPSY TEMPORAL ARTERY;  Surgeon: Clyde Canterbury, MD;  Location: [facility] ORS;  Service: ENT;  Laterality: Left;  . HERNIA REPAIR    . PENILE PROSTHESIS IMPLANT    . TRANSURETHRAL RESECTION OF PROSTATE    . XI ROBOTIC ASSISTED INGUINAL HERNIA REPAIR WITH MESH Left 04/22/2019   Procedure: XI ROBOTIC ASSISTED INGUINAL HERNIA REPAIR WITH MESH;  Surgeon: Herbert Pun, MD;  Location: [facility] ORS;  Service: General;  Laterality: Left;    Social History:  reports that he quit smoking about 10 years ago. His smoking  use included cigarettes. He smoked 1.00 pack per day. He has never used smokeless tobacco. He reports that he does not drink alcohol and does not use drugs.  Family History:  Family History  Problem Relation Age of Onset  . Diabetes Mother   . Heart disease Mother   . Diabetes Father   . Heart disease Father   . Diabetes Sister   . Diabetes Brother   . Kidney failure Brother      Prior to Admission medications   Medication Sig Start Date End Date Taking?  Authorizing Provider  acetaminophen (TYLENOL) 500 MG tablet Take 500 mg by mouth every 6 (six) hours as needed.   Yes [provider]  azithromycin (ZITHROMAX) 250 MG tablet Take 250 mg by mouth as directed. 12/10/19  Yes [provider]  benzonatate (TESSALON) 200 MG capsule Take by mouth. 12/10/19  Yes [provider]  cetirizine (ZYRTEC) 10 MG tablet Take 10 mg by mouth daily as needed for allergies.   Yes [provider]  cholecalciferol (VITAMIN D) 1000 UNITS tablet Take 1,000 Units by mouth daily after supper.    Yes [provider]  finasteride (PROSCAR) 5 MG tablet Take 5 mg by mouth daily.   Yes [provider]  hydrochlorothiazide (HYDRODIURIL) 25 MG tablet Take 12.5 mg by mouth daily.   Yes [provider]  losartan (COZAAR) 25 MG tablet Take 25 mg by mouth daily. 04/09/19  Yes [provider]  metFORMIN (GLUCOPHAGE) 500 MG tablet Take 500 mg by mouth daily with breakfast.   Yes [provider]  mirtazapine (REMERON) 15 MG tablet Take 15 mg by mouth at bedtime as needed (sleep).  08/21/10  Yes [provider]  omeprazole (PRILOSEC) 20 MG capsule Take 20 mg by mouth daily before breakfast.    Yes [provider]  sodium chloride (OCEAN) 0.65 % SOLN nasal spray Place 1 spray into both nostrils as needed for congestion.   Yes [provider]  terazosin (HYTRIN) 10 MG capsule Take 10 mg by mouth at bedtime.   Yes [provider]    Physical Exam: Vitals:   12/12/19 1101 12/12/19 1330 12/12/19 1428 12/12/19 1440  BP: 137/75 137/77 (!) 161/78   Pulse: (!) 55 (!) 53 68 (!) 55  Resp: 18 18 16 16   Temp:   98.4 F (36.9 C)   TempSrc:   Oral   SpO2: 99% 97% 100% 98%  Weight:      Height:       General: Not in acute distress HEENT:       Eyes: PERRL, EOMI, no scleral icterus.       ENT: No discharge from the ears and nose, no pharynx injection, no tonsillar enlargement.         Neck: No JVD, no bruit, no mass felt. Heme: No neck lymph node enlargement. Cardiac: S1/S2, RRR, No murmurs, No gallops or rubs. Respiratory: No rales, wheezing, rhonchi or rubs. GI: Soft, nondistended, nontender, no rebound pain, no organomegaly, BS present. GU: No hematuria Ext: No pitting leg edema bilaterally. 2+DP/PT pulse bilaterally. Musculoskeletal: No joint deformities, No joint redness or warmth, no limitation of ROM in spin. Skin: No rashes.  Neuro: Alert, oriented X3, cranial nerves II-XII grossly intact, moves all extremities normally Psych: Patient is not psychotic, no suicidal or hemocidal ideation.  Labs on Admission: I have personally reviewed following labs and imaging studies  CBC: Recent Labs  Lab 12/12/19 0836  WBC 6.8  NEUTROABS 4.7  HGB 11.2*  HCT 34.0*  MCV 80.0  PLT 283   Basic Metabolic Panel: Recent Labs  Lab 12/12/19 0836  NA 131*  K 3.6  CL 99  CO2 22  GLUCOSE 143*  BUN 24*  CREATININE 1.59*  CALCIUM 9.8   GFR: Estimated Creatinine Clearance: 46.5 mL/min (A) (by C-G formula based on SCr of 1.59 mg/dL (H)). Liver Function Tests: Recent Labs  Lab 12/12/19 0836  AST 17  ALT 11  ALKPHOS 51  BILITOT 0.7  PROT 7.6  ALBUMIN 3.6   No results for input(s): LIPASE, AMYLASE in the last 168 hours. No results for input(s): AMMONIA in the last 168 hours. Coagulation Profile: No results for input(s): INR, PROTIME in the last 168 hours. Cardiac Enzymes: No results for input(s): CKTOTAL, CKMB, CKMBINDEX, TROPONINI in the last 168 hours. BNP (last 3 results) No results for input(s): PROBNP in the last 8760 hours. HbA1C: No results for input(s): HGBA1C in the last 72 hours. CBG: Recent Labs  Lab 12/12/19 1522 12/12/19 1630  GLUCAP 114* 107*   Lipid Profile: No results for input(s): CHOL, HDL, LDLCALC, TRIG, CHOLHDL, LDLDIRECT in the last 72 hours. Thyroid Function Tests: No results for input(s): TSH, T4TOTAL, FREET4, T3FREE, THYROIDAB  in the last 72 hours. Anemia Panel: No results for input(s): VITAMINB12, FOLATE, FERRITIN, TIBC, IRON, RETICCTPCT in the last 72 hours. Urine analysis:    Component Value Date/Time   COLORURINE YELLOW (A) 12/12/2019 1109   APPEARANCEUR CLEAR (A) 12/12/2019 1109   LABSPEC 1.019 12/12/2019 1109   PHURINE 5.0 12/12/2019 1109   GLUCOSEU NEGATIVE 12/12/2019 1109   HGBUR SMALL (A) 12/12/2019 1109   BILIRUBINUR NEGATIVE 12/12/2019 1109   [place] 12/12/2019 1109   PROTEINUR 30 (A) 12/12/2019 1109   NITRITE NEGATIVE 12/12/2019 1109   LEUKOCYTESUR NEGATIVE 12/12/2019 1109   Sepsis Labs: @LABRCNTIP (procalcitonin:4,lacticidven:4) ) Recent Results (from the past 240 hour(s))  SARS Coronavirus 2 by RT PCR (hospital order, performed in [facility] hospital lab) Nasopharyngeal Nasopharyngeal Swab     Status: None   Collection Time: 12/12/19  8:36 AM   Specimen: Nasopharyngeal Swab  Result Value Ref Range Status   SARS Coronavirus 2 NEGATIVE NEGATIVE Final    Comment: (NOTE) SARS-CoV-2 target nucleic acids are NOT DETECTED.  The SARS-CoV-2 RNA is generally detectable in upper and lower respiratory specimens during the acute phase of infection. The lowest concentration of SARS-CoV-2 viral copies this assay can detect is 250 copies / mL. A negative result does not preclude SARS-CoV-2 infection and should not be used as the sole basis for treatment or other patient management decisions.  A negative result may occur with improper specimen collection / handling, submission of specimen other than nasopharyngeal swab, presence of viral mutation(s) within the areas targeted by this assay, and inadequate number of viral copies (<250 copies / mL). A negative result must be combined with clinical observations, patient history, and epidemiological information.  Fact Sheet for Patients:   StrictlyIdeas.no  Fact Sheet for Healthcare  Providers: BankingDealers.co.za  This test is not yet approved or  cleared by the [place] FDA and has been authorized for detection and/or diagnosis of SARS-CoV-2 by FDA under an Emergency Use Authorization (EUA).  This EUA will remain in effect (meaning this test can be used) for the duration of the COVID-19 declaration under Section 564(b)(1) of the Act, 21 U.S.C. section 360bbb-3(b)(1), unless the authorization is terminated or revoked sooner.  Performed at [facility], [place]  [address] [place], [place] [address]      Radiological Exams on Admission: DG Chest 2 View  Result Date: 12/12/2019 CLINICAL DATA:  Shortness of breath, fever, and chills for 1 week. EXAM: CHEST - 2 VIEW COMPARISON:  CT chest April 29, 2017 FINDINGS: The mediastinal contour and cardiac silhouette are stable. The aorta is tortuous. Minimal atelectasis of both lung bases are noted. There is no focal infiltrate, pulmonary edema, or left pleural effusion. Minimal right pleural effusion is identified. No acute abnormalities identified in the visualized bony structures. IMPRESSION: Minimal atelectasis of both lung bases. Minimal right pleural effusion. Electronically Signed   By: Abelardo Diesel M.D.   On: 12/12/2019 09:33   CT Angio Chest PE W and/or Wo Contrast  Result Date: 12/12/2019 CLINICAL DATA:  79 year old male with a history of shortness of breath EXAM: CT ANGIOGRAPHY CHEST CT ABDOMEN AND PELVIS WITH CONTRAST TECHNIQUE: Multidetector CT imaging of the chest was performed using the standard protocol during bolus administration of intravenous contrast. Multiplanar CT image reconstructions and MIPs were obtained to evaluate the vascular anatomy. Multidetector CT imaging of the abdomen and pelvis was performed using the standard protocol during bolus administration of intravenous contrast. CONTRAST:  27mL OMNIPAQUE IOHEXOL 350 MG/ML SOLN COMPARISON:  Chest x-ray same day, CT chest  04/29/2017 FINDINGS: CTA CHEST FINDINGS Cardiovascular: Heart: No cardiomegaly. No pericardial fluid/thickening. No significant coronary calcifications. Aorta: Unremarkable course, caliber, contour of the thoracic aorta. No aneurysm or dissection flap. No periaortic fluid. Pulmonary arteries: No central, lobar, segmental, or proximal subsegmental filling defects. Mediastinum/Nodes: Right substernal thyroid nodule again demonstrated. Prior biopsy performed 04/05/2016. No mediastinal adenopathy. Left hilar lymphadenopathy. Unremarkable thoracic esophagus. Lungs/Pleura: Confluent nodular opacity of the left lower lobe, in a predominantly peribronchovascular distribution. No interlobular septal thickening. Subpleural nodule along the fissure on image 62 of series 4, with adjacent subpleural nodule on the lateral pleura on image 64 of series 4. These are larger than the comparison CT though are likely reactive subpleural lymph nodes. To a lesser degree there is peripheral nodularity of the right lower lobe, dependent distribution. There is persistent complete atelectasis/collapse of what appears to be the superior segment of the left lower lobe. No pleural effusion or pneumothorax.  Upper lobes are clear. CT ABDOMEN and PELVIS FINDINGS Hepatobiliary: Unremarkable liver.  Unremarkable gallbladder. Pancreas: Unremarkable Spleen: Unremarkable Adrenals/Urinary Tract: - Right adrenal gland:  Unremarkable - Left adrenal gland: Unremarkable. - Right kidney: No hydronephrosis, nephrolithiasis, or ureteral dilation. Mild perinephric edema within the fat. Several low-density/nonenhancing cystic structures of the right kidney. The larger of these are compatible with simple cysts. The smaller are incompletely characterized. Relatively unchanged compared to the prior CT. - Left Kidney: No hydronephrosis, nephrolithiasis, or ureteral dilation. Mild perinephric edema of the fat. There are several small hypoenhancing/hypodense renal  lesions on the left, all of which are too small to characterize. - Urinary Bladder: Urinary bladder is unremarkable Stomach/Bowel: - Stomach: Unremarkable. - Small bowel: Unremarkable - Appendix: Normal. - Colon: Colonic diverticula.  No acute inflammatory changes. Vascular/Lymphatic: Atherosclerotic changes of the aorta. Mesenteric arteries and renal arteries are patent. Bilateral iliac arteries and proximal femoral arteries are patent. Unremarkable venous vasculature. Unremarkable portal vasculature. Reproductive: Penile prosthesis. Other: Fat containing umbilical hernia. Musculoskeletal: No acute displaced fracture. No bony canal narrowing. Vacuum disc phenomenon at L5-S1. Mild S-shaped scoliotic curvature is unchanged. Review of the MIP images confirms the above findings. IMPRESSION: Chest CT angiogram is negative for pulmonary emboli. Lobar pneumonia of the left lower  lobe with reactive hilar adenopathy. Followup PA and lateral chest X-ray is recommended in 3-4 weeks following therapy to assure resolution. Mild bilateral renal edema, nonspecific. If there is concern for urinary tract infection, recommend correlation with urinalysis. Ancillary findings of the chest/abdomen/pelvis, as above. Electronically Signed   By: Corrie Mckusick D.O.   On: 12/12/2019 10:50   CT ABDOMEN PELVIS W CONTRAST  Result Date: 12/12/2019 CLINICAL DATA:  79 year old male with a history of shortness of breath EXAM: CT ANGIOGRAPHY CHEST CT ABDOMEN AND PELVIS WITH CONTRAST TECHNIQUE: Multidetector CT imaging of the chest was performed using the standard protocol during bolus administration of intravenous contrast. Multiplanar CT image reconstructions and MIPs were obtained to evaluate the vascular anatomy. Multidetector CT imaging of the abdomen and pelvis was performed using the standard protocol during bolus administration of intravenous contrast. CONTRAST:  31mL OMNIPAQUE IOHEXOL 350 MG/ML SOLN COMPARISON:  Chest x-ray same day, CT  chest 04/29/2017 FINDINGS: CTA CHEST FINDINGS Cardiovascular: Heart: No cardiomegaly. No pericardial fluid/thickening. No significant coronary calcifications. Aorta: Unremarkable course, caliber, contour of the thoracic aorta. No aneurysm or dissection flap. No periaortic fluid. Pulmonary arteries: No central, lobar, segmental, or proximal subsegmental filling defects. Mediastinum/Nodes: Right substernal thyroid nodule again demonstrated. Prior biopsy performed 04/05/2016. No mediastinal adenopathy. Left hilar lymphadenopathy. Unremarkable thoracic esophagus. Lungs/Pleura: Confluent nodular opacity of the left lower lobe, in a predominantly peribronchovascular distribution. No interlobular septal thickening. Subpleural nodule along the fissure on image 62 of series 4, with adjacent subpleural nodule on the lateral pleura on image 64 of series 4. These are larger than the comparison CT though are likely reactive subpleural lymph nodes. To a lesser degree there is peripheral nodularity of the right lower lobe, dependent distribution. There is persistent complete atelectasis/collapse of what appears to be the superior segment of the left lower lobe. No pleural effusion or pneumothorax.  Upper lobes are clear. CT ABDOMEN and PELVIS FINDINGS Hepatobiliary: Unremarkable liver.  Unremarkable gallbladder. Pancreas: Unremarkable Spleen: Unremarkable Adrenals/Urinary Tract: - Right adrenal gland:  Unremarkable - Left adrenal gland: Unremarkable. - Right kidney: No hydronephrosis, nephrolithiasis, or ureteral dilation. Mild perinephric edema within the fat. Several low-density/nonenhancing cystic structures of the right kidney. The larger of these are compatible with simple cysts. The smaller are incompletely characterized. Relatively unchanged compared to the prior CT. - Left Kidney: No hydronephrosis, nephrolithiasis, or ureteral dilation. Mild perinephric edema of the fat. There are several small hypoenhancing/hypodense  renal lesions on the left, all of which are too small to characterize. - Urinary Bladder: Urinary bladder is unremarkable Stomach/Bowel: - Stomach: Unremarkable. - Small bowel: Unremarkable - Appendix: Normal. - Colon: Colonic diverticula.  No acute inflammatory changes. Vascular/Lymphatic: Atherosclerotic changes of the aorta. Mesenteric arteries and renal arteries are patent. Bilateral iliac arteries and proximal femoral arteries are patent. Unremarkable venous vasculature. Unremarkable portal vasculature. Reproductive: Penile prosthesis. Other: Fat containing umbilical hernia. Musculoskeletal: No acute displaced fracture. No bony canal narrowing. Vacuum disc phenomenon at L5-S1. Mild S-shaped scoliotic curvature is unchanged. Review of the MIP images confirms the above findings. IMPRESSION: Chest CT angiogram is negative for pulmonary emboli. Lobar pneumonia of the left lower lobe with reactive hilar adenopathy. Followup PA and lateral chest X-ray is recommended in 3-4 weeks following therapy to assure resolution. Mild bilateral renal edema, nonspecific. If there is concern for urinary tract infection, recommend correlation with urinalysis. Ancillary findings of the chest/abdomen/pelvis, as above. Electronically Signed   By: Corrie Mckusick D.O.   On: 12/12/2019 10:50  EKG: Independently reviewed.  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  Temperature 99.  Not septic clinically.  Hemodynamically stable - Placed on MedSurg bed for observation - IV  Rocephin and doxycycline - Mucinex for cough  - Bronchodilators - Urine legionella and S. 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  His creatinine is 1.59, BUN 24 -Hold HCTZ and Cozaar -IV fluid as above  Type II diabetes mellitus with renal manifestations ([place]): A1c 7.2 recently.  Poorly controlled.  Patient is taking Metformin -Sliding scale insulin     DVT ppx: SQ Lovenox Code Status: Full code Family Communication: not done, no family member is at bed side. Disposition Plan:  Anticipate discharge back to previous environment Consults called:  none Admission status: Med-surg bed for obs     Status is: Observation  The patient remains OBS appropriate and will d/c before 2 midnights.  Dispo: The patient is from: Home              Anticipated d/c is to: Home              Anticipated d/c date is: 1 day              Patient currently is not medically stable to d/c.           Date of Service 12/12/2019    Ivor [facility]   If 7PM-7AM, please contact night-coverage [email or web address] 12/12/2019, 5:56 PM

## 2019-12-12 NOTE — ED Notes (Signed)
Pt ambulated with an Osat of 95%

## 2019-12-12 NOTE — ED Notes (Signed)
Pt presents to the ED for SOB, chills, and dry mouth. Pt states he has been feeling bad since Sunday. Pt was seen by his PCP and was dx with a possible sinus infection, was sent home with antibiotics and Zyrtec. Pt states that he has been taking the medications with no change and no relief since Wednesday. Pt is A&Ox4 and NAD. Placed on cardiac monitor and all V/S WNL.

## 2019-12-12 NOTE — ED Triage Notes (Signed)
Pt to ED via POV c/o shortness of breath, fever, and chills. Pt states that this started about 1 week ago. Pt states he has been taking antibiotics but he is not feeling better. Pt states that he was diagnosed with a sinus infection.

## 2019-12-12 NOTE — ED Notes (Signed)
Pt transported to XRay 

## 2019-12-12 NOTE — ED Notes (Signed)
Pt given water with approval from Dr. Jari Pigg, MD.

## 2019-12-12 NOTE — ED Provider Notes (Signed)
[facility] Emergency Department Provider Note  ____________________________________________   First MD Initiated Contact with Patient 12/12/19 [phone]     (approximate)  I have reviewed the triage vital signs and the nursing notes.   HISTORY  Chief Complaint Shortness of Breath    HPI Ricky Riley is a 79 y.o. male with diabetes, hypertension, sleep apnea on CPAP who comes in for multiple symptoms.  Patient states that has not been feeling better for about a week now.  States that he was seen by his primary care doctor on Wednesday and diagnosed with sinusitis.  He was started on azithromycin.  Stated that he had an abnormal chest x-ray was told he need repeat in a week.  He states that he is having no improvement.  Patient states that he has continued to feel bad.  He states that he feels lightheaded, short of breath, coughing.  States is not eating as much.  Denies any abdominal pain, urinary symptoms.  Patient states that his temperatures have been running 100.3.  He states that he last took Tylenol at 530 this morning.  His symptoms have been constant, not better with the medications listed above, nothing makes it worse.  States that he has been tested for Covid.  He states that he has had his vaccines.          Past Medical History:  Diagnosis Date  . Arthritis   . Diabetes mellitus without complication ([place])   . Dysrhythmia   . GERD (gastroesophageal reflux disease)   . Headache   . Hypertension   . Sleep apnea    CPAP     Patient Active Problem List   Diagnosis Date Noted  . Left inguinal hernia 04/22/2019  . Closed fracture of body of sternum with delayed healing 04/30/2017  . SOB (shortness of breath) 03/22/2017  . Chest pain, musculoskeletal 02/05/2017  . Acute pain of right shoulder 01/11/2017  . Calf cramp 12/31/2016  . Right thyroid nodule 04/20/2016  . Primary osteoarthritis of right knee 03/30/2016  . Abnormal CT scan, neck  03/21/2016  . Hypercalcemia 03/12/2016  . Chronic cough 01/23/2016  . Current chronic use of systemic steroids 01/23/2016  . Headache 01/26/2015  . Heart palpitations 03/17/2014  . Hypertension 03/17/2014  . Sleep apnea 03/17/2014  . Male erectile dysfunction 01/15/2013  . 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 ARTERY BIOPSY Left 03/02/2015   Procedure: BIOPSY TEMPORAL ARTERY;  Surgeon: Clyde Canterbury, MD;  Location: [facility] ORS;  Service: ENT;  Laterality: Left;  . HERNIA REPAIR    . PENILE PROSTHESIS IMPLANT    . TRANSURETHRAL RESECTION OF PROSTATE    . XI ROBOTIC ASSISTED INGUINAL HERNIA REPAIR WITH MESH Left 04/22/2019   Procedure: XI ROBOTIC ASSISTED INGUINAL HERNIA REPAIR WITH MESH;  Surgeon: Herbert Pun, MD;  Location: [facility] ORS;  Service: General;  Laterality: Left;    Prior to Admission medications   Medication Sig Start Date End Date Taking? Authorizing Provider  acetaminophen (TYLENOL) 500 MG tablet Take 500 mg by mouth every 6 (six) hours as needed.    [provider]  aspirin EC 81 MG tablet Take 81 mg by mouth daily.    [provider]  cetirizine (ZYRTEC) 10 MG tablet Take 10 mg by mouth daily as needed for allergies.    [provider]  cholecalciferol (VITAMIN D)  1000 UNITS tablet Take 1,000 Units by mouth daily after supper.     [provider]  finasteride (PROSCAR) 5 MG tablet Take 5 mg by mouth daily.    [provider]  hydrochlorothiazide (HYDRODIURIL) 25 MG tablet Take 12.5 mg by mouth daily.    [provider]  losartan (COZAAR) 25 MG tablet Take 25 mg by mouth daily. 04/09/19   [provider]  metFORMIN (GLUCOPHAGE) 500 MG tablet Take 500 mg by mouth daily with breakfast.    [provider]  mirtazapine (REMERON) 15 MG tablet Take 15 mg by  mouth at bedtime as needed (sleep).  08/21/10   [provider]  omeprazole (PRILOSEC) 20 MG capsule Take 20 mg by mouth daily before breakfast.     [provider]  ondansetron (ZOFRAN-ODT) 4 MG disintegrating tablet Take 1 tablet (4 mg total) by mouth every 6 (six) hours as needed for nausea. 04/23/19   Herbert Pun, MD  sodium chloride (OCEAN) 0.65 % SOLN nasal spray Place 1 spray into both nostrils as needed for congestion.    [provider]  terazosin (HYTRIN) 10 MG capsule Take 10 mg by mouth at bedtime.    [provider]    Allergies Ciprofloxacin, Penicillins, Sulfa antibiotics, and Sulfasalazine  Family History  Problem Relation Age of Onset  . Diabetes Mother   . Heart disease Mother   . Diabetes Father   . Heart disease Father   . Diabetes Sister   . Diabetes Brother   . Kidney failure Brother     Social History Social History   Tobacco Use  . Smoking status: Former Smoker    Packs/day: 1.00    Types: Cigarettes    Quit date: 06/05/2009    Years since quitting: 10.5  . Smokeless tobacco: Never Used  Vaping Use  . Vaping Use: Never used  Substance Use Topics  . Alcohol use: No  . Drug use: No      Review of Systems Constitutional: Positive fever, chills Eyes: No visual changes. ENT: No sore throat. Cardiovascular: No chest pain Respiratory: Positive for SOB, cough Gastrointestinal: No abdominal pain.  No nausea, no vomiting.  No diarrhea.  No constipation.  Not eating as much Genitourinary: Negative for dysuria. Musculoskeletal: Negative for back pain. Skin: Negative for rash. Neurological: Positive mild headache, no focal weakness or numbness. All other ROS negative ____________________________________________   PHYSICAL EXAM:  VITAL SIGNS: ED Triage Vitals  Enc Vitals Group     BP 12/12/19 0747 135/85     Pulse Rate 12/12/19 0747 79     Resp 12/12/19 0747 16     Temp 12/12/19 0747 99 F (37.2 C)      Temp Source 12/12/19 0747 Oral     SpO2 12/12/19 0747 95 %     Weight 12/12/19 0748 187 lb (84.8 kg)     Height 12/12/19 0748 6\' 3"  (1.905 m)     Head Circumference --      Peak Flow --      Pain Score 12/12/19 0747 8     Pain Loc --      Pain Edu? --      Excl. in [place]? --     Constitutional: Alert and oriented. Well appearing and in no acute distress. Eyes: Conjunctivae are normal. EOMI. Head: Atraumatic. Nose: No congestion/rhinnorhea. Mouth/Throat: Mucous membranes are moist.   Neck: No stridor. Trachea Midline. FROM Cardiovascular: Normal rate, regular rhythm. Grossly normal heart sounds.  Good peripheral circulation. Respiratory:  Clear lungs, no increased work of breathing  gastrointestinal: Soft and nontender. No distention. No abdominal bruits.  Musculoskeletal: No lower extremity tenderness nor edema.  No joint effusions. Neurologic:  Normal speech and language. No gross focal neurologic deficits are appreciated.  Skin:  Skin is warm, dry and intact. No rash noted. Psychiatric: Mood and affect are normal. Speech and behavior are normal. GU: Deferred   ____________________________________________   LABS (all labs ordered are listed, but only abnormal results are displayed)  Labs Reviewed  CBC WITH DIFFERENTIAL/PLATELET - Abnormal; Notable for the following components:      Result Value   Hemoglobin 11.2 (*)    HCT 34.0 (*)    Monocytes Absolute 1.1 (*)    All other components within normal limits  COMPREHENSIVE METABOLIC PANEL - Abnormal; Notable for the following components:   Sodium 131 (*)    Glucose, Bld 143 (*)    BUN 24 (*)    Creatinine, Ser 1.59 (*)    GFR calc non Af Amer 41 (*)    GFR calc Af Amer 48 (*)    All other components within normal limits  BRAIN NATRIURETIC PEPTIDE - Abnormal; Notable for the following components:   B Natriuretic Peptide 115.4 (*)    All other components within normal limits  URINALYSIS, COMPLETE (UACMP) WITH  MICROSCOPIC - Abnormal; Notable for the following components:   Color, Urine YELLOW (*)    APPearance CLEAR (*)    Hgb urine dipstick SMALL (*)    Protein, ur 30 (*)    All other components within normal limits  TROPONIN I (HIGH SENSITIVITY) - Abnormal; Notable for the following components:   Troponin I (High Sensitivity) 24 (*)    All other components within normal limits  TROPONIN I (HIGH SENSITIVITY) - Abnormal; Notable for the following components:   Troponin I (High Sensitivity) 21 (*)    All other components within normal limits  SARS CORONAVIRUS 2 BY RT PCR (HOSPITAL ORDER, [place] [facility])  CULTURE, BLOOD (ROUTINE X 2)  CULTURE, BLOOD (ROUTINE X 2)  PROCALCITONIN  LACTIC ACID, PLASMA  TROPONIN I (HIGH SENSITIVITY)   ____________________________________________   ED ECG REPORT I, Vanessa Gibsonton, the attending physician, personally viewed and interpreted this ECG.  Normal sinus rate of 67 without ST elevation T wave inversion in aVL, occasional PAC, normal intervals otherwise with some LVH.  No prior EKG to compare to ____________________________________________  RADIOLOGY I, Vanessa [place], personally viewed and evaluated these images (plain radiographs) as part of my medical decision making, as well as reviewing the written report by the radiologist.  ED MD interpretation: Chest x-ray negative for focal pneumonia  Official radiology report(s): DG Chest 2 View  Result Date: 12/12/2019 CLINICAL DATA:  Shortness of breath, fever, and chills for 1 week. EXAM: CHEST - 2 VIEW COMPARISON:  CT chest April 29, 2017 FINDINGS: The mediastinal contour and cardiac silhouette are stable. The aorta is tortuous. Minimal atelectasis of both lung bases are noted. There is no focal infiltrate, pulmonary edema, or left pleural effusion. Minimal right pleural effusion is identified. No acute abnormalities identified in the visualized bony structures. IMPRESSION: Minimal  atelectasis of both lung bases. Minimal right pleural effusion. Electronically Signed   By: Abelardo Diesel M.D.   On: 12/12/2019 09:33   CT Angio Chest PE W and/or Wo Contrast  Result Date: 12/12/2019 CLINICAL DATA:  79 year old male with a history of shortness of  breath EXAM: CT ANGIOGRAPHY CHEST CT ABDOMEN AND PELVIS WITH CONTRAST TECHNIQUE: Multidetector CT imaging of the chest was performed using the standard protocol during bolus administration of intravenous contrast. Multiplanar CT image reconstructions and MIPs were obtained to evaluate the vascular anatomy. Multidetector CT imaging of the abdomen and pelvis was performed using the standard protocol during bolus administration of intravenous contrast. CONTRAST:  87mL OMNIPAQUE IOHEXOL 350 MG/ML SOLN COMPARISON:  Chest x-ray same day, CT chest 04/29/2017 FINDINGS: CTA CHEST FINDINGS Cardiovascular: Heart: No cardiomegaly. No pericardial fluid/thickening. No significant coronary calcifications. Aorta: Unremarkable course, caliber, contour of the thoracic aorta. No aneurysm or dissection flap. No periaortic fluid. Pulmonary arteries: No central, lobar, segmental, or proximal subsegmental filling defects. Mediastinum/Nodes: Right substernal thyroid nodule again demonstrated. Prior biopsy performed 04/05/2016. No mediastinal adenopathy. Left hilar lymphadenopathy. Unremarkable thoracic esophagus. Lungs/Pleura: Confluent nodular opacity of the left lower lobe, in a predominantly peribronchovascular distribution. No interlobular septal thickening. Subpleural nodule along the fissure on image 62 of series 4, with adjacent subpleural nodule on the lateral pleura on image 64 of series 4. These are larger than the comparison CT though are likely reactive subpleural lymph nodes. To a lesser degree there is peripheral nodularity of the right lower lobe, dependent distribution. There is persistent complete atelectasis/collapse of what appears to be the superior segment  of the left lower lobe. No pleural effusion or pneumothorax.  Upper lobes are clear. CT ABDOMEN and PELVIS FINDINGS Hepatobiliary: Unremarkable liver.  Unremarkable gallbladder. Pancreas: Unremarkable Spleen: Unremarkable Adrenals/Urinary Tract: - Right adrenal gland:  Unremarkable - Left adrenal gland: Unremarkable. - Right kidney: No hydronephrosis, nephrolithiasis, or ureteral dilation. Mild perinephric edema within the fat. Several low-density/nonenhancing cystic structures of the right kidney. The larger of these are compatible with simple cysts. The smaller are incompletely characterized. Relatively unchanged compared to the prior CT. - Left Kidney: No hydronephrosis, nephrolithiasis, or ureteral dilation. Mild perinephric edema of the fat. There are several small hypoenhancing/hypodense renal lesions on the left, all of which are too small to characterize. - Urinary Bladder: Urinary bladder is unremarkable Stomach/Bowel: - Stomach: Unremarkable. - Small bowel: Unremarkable - Appendix: Normal. - Colon: Colonic diverticula.  No acute inflammatory changes. Vascular/Lymphatic: Atherosclerotic changes of the aorta. Mesenteric arteries and renal arteries are patent. Bilateral iliac arteries and proximal femoral arteries are patent. Unremarkable venous vasculature. Unremarkable portal vasculature. Reproductive: Penile prosthesis. Other: Fat containing umbilical hernia. Musculoskeletal: No acute displaced fracture. No bony canal narrowing. Vacuum disc phenomenon at L5-S1. Mild S-shaped scoliotic curvature is unchanged. Review of the MIP images confirms the above findings. IMPRESSION: Chest CT angiogram is negative for pulmonary emboli. Lobar pneumonia of the left lower lobe with reactive hilar adenopathy. Followup PA and lateral chest X-ray is recommended in 3-4 weeks following therapy to assure resolution. Mild bilateral renal edema, nonspecific. If there is concern for urinary tract infection, recommend correlation  with urinalysis. Ancillary findings of the chest/abdomen/pelvis, as above. Electronically Signed   By: Corrie Mckusick D.O.   On: 12/12/2019 10:50   CT ABDOMEN PELVIS W CONTRAST  Result Date: 12/12/2019 CLINICAL DATA:  79 year old male with a history of shortness of breath EXAM: CT ANGIOGRAPHY CHEST CT ABDOMEN AND PELVIS WITH CONTRAST TECHNIQUE: Multidetector CT imaging of the chest was performed using the standard protocol during bolus administration of intravenous contrast. Multiplanar CT image reconstructions and MIPs were obtained to evaluate the vascular anatomy. Multidetector CT imaging of the abdomen and pelvis was performed using the standard protocol during bolus administration of intravenous  contrast. CONTRAST:  44mL OMNIPAQUE IOHEXOL 350 MG/ML SOLN COMPARISON:  Chest x-ray same day, CT chest 04/29/2017 FINDINGS: CTA CHEST FINDINGS Cardiovascular: Heart: No cardiomegaly. No pericardial fluid/thickening. No significant coronary calcifications. Aorta: Unremarkable course, caliber, contour of the thoracic aorta. No aneurysm or dissection flap. No periaortic fluid. Pulmonary arteries: No central, lobar, segmental, or proximal subsegmental filling defects. Mediastinum/Nodes: Right substernal thyroid nodule again demonstrated. Prior biopsy performed 04/05/2016. No mediastinal adenopathy. Left hilar lymphadenopathy. Unremarkable thoracic esophagus. Lungs/Pleura: Confluent nodular opacity of the left lower lobe, in a predominantly peribronchovascular distribution. No interlobular septal thickening. Subpleural nodule along the fissure on image 62 of series 4, with adjacent subpleural nodule on the lateral pleura on image 64 of series 4. These are larger than the comparison CT though are likely reactive subpleural lymph nodes. To a lesser degree there is peripheral nodularity of the right lower lobe, dependent distribution. There is persistent complete atelectasis/collapse of what appears to be the superior segment  of the left lower lobe. No pleural effusion or pneumothorax.  Upper lobes are clear. CT ABDOMEN and PELVIS FINDINGS Hepatobiliary: Unremarkable liver.  Unremarkable gallbladder. Pancreas: Unremarkable Spleen: Unremarkable Adrenals/Urinary Tract: - Right adrenal gland:  Unremarkable - Left adrenal gland: Unremarkable. - Right kidney: No hydronephrosis, nephrolithiasis, or ureteral dilation. Mild perinephric edema within the fat. Several low-density/nonenhancing cystic structures of the right kidney. The larger of these are compatible with simple cysts. The smaller are incompletely characterized. Relatively unchanged compared to the prior CT. - Left Kidney: No hydronephrosis, nephrolithiasis, or ureteral dilation. Mild perinephric edema of the fat. There are several small hypoenhancing/hypodense renal lesions on the left, all of which are too small to characterize. - Urinary Bladder: Urinary bladder is unremarkable Stomach/Bowel: - Stomach: Unremarkable. - Small bowel: Unremarkable - Appendix: Normal. - Colon: Colonic diverticula.  No acute inflammatory changes. Vascular/Lymphatic: Atherosclerotic changes of the aorta. Mesenteric arteries and renal arteries are patent. Bilateral iliac arteries and proximal femoral arteries are patent. Unremarkable venous vasculature. Unremarkable portal vasculature. Reproductive: Penile prosthesis. Other: Fat containing umbilical hernia. Musculoskeletal: No acute displaced fracture. No bony canal narrowing. Vacuum disc phenomenon at L5-S1. Mild S-shaped scoliotic curvature is unchanged. Review of the MIP images confirms the above findings. IMPRESSION: Chest CT angiogram is negative for pulmonary emboli. Lobar pneumonia of the left lower lobe with reactive hilar adenopathy. Followup PA and lateral chest X-ray is recommended in 3-4 weeks following therapy to assure resolution. Mild bilateral renal edema, nonspecific. If there is concern for urinary tract infection, recommend correlation  with urinalysis. Ancillary findings of the chest/abdomen/pelvis, as above. Electronically Signed   By: Corrie Mckusick D.O.   On: 12/12/2019 10:50    ____________________________________________   PROCEDURES  Procedure(s) performed (including Critical Care):  Procedures   ____________________________________________   INITIAL IMPRESSION / ASSESSMENT AND PLAN / ED COURSE   Ricky Riley was evaluated in Emergency Department on 12/12/2019 for the symptoms described in the history of present illness. He was evaluated in the context of the global COVID-19 pandemic, which necessitated consideration that the patient might be at risk for infection with the SARS-CoV-2 virus that causes COVID-19. Institutional protocols and algorithms that pertain to the evaluation of patients at risk for COVID-19 are in a state of rapid change based on information released by regulatory bodies including the CDC and federal and state organizations. These policies and algorithms were followed during the patient's care in the ED.     Pt presents with SOB. Differential includes: PNA-will get xray to evaluation  Anemia-CBC to evaluate ACS- will get trops Arrhythmia-Will get EKG and keep on monitor.  COVID- will get testing per algorithm.   X-ray is negative.  Patient continues to have shortness of breath with some low-grade temperatures.  Will get CT PE to make sure no evidence of pulmonary embolism as well as to better look for pneumonia.  Patient also reports some abdominal pain especially after eating.  Will get a CT abdomen to make sure no other acute pathology.  Cardiac markers are stable.  Labs are reassuring.  CT scan consistent with pneumonia.  Discussed with patient the different options including admission given his curb 65 score of 2 and he is already failed outpatient treatment with azithromycin versus going home with stronger antibiotics.  Patient reports feeling lightheaded with walking and would be  more comfortable coming in the hospital for IV antibiotics            ____________________________________________   FINAL CLINICAL IMPRESSION(S) / ED DIAGNOSES   Final diagnoses:  Community acquired pneumonia of left lower lobe of lung     MEDICATIONS GIVEN DURING THIS VISIT:  Medications  doxycycline (VIBRAMYCIN) 100 mg in sodium chloride 0.9 % 250 mL IVPB (has no administration in time range)  albuterol (PROVENTIL) (2.5 MG/3ML) 0.083% nebulizer solution 2.5 mg (has no administration in time range)  dextromethorphan-guaiFENesin (MUCINEX DM) 30-600 MG per 12 hr tablet 1 tablet (has no administration in time range)  ondansetron (ZOFRAN) injection 4 mg (has no administration in time range)  hydrALAZINE (APRESOLINE) injection 5 mg (has no administration in time range)  acetaminophen (TYLENOL) tablet 650 mg (has no administration in time range)  0.9 %  sodium chloride infusion (has no administration in time range)  cefTRIAXone (ROCEPHIN) 1 g in sodium chloride 0.9 % 100 mL IVPB (has no administration in time range)  doxycycline (VIBRAMYCIN) 100 mg in sodium chloride 0.9 % 250 mL IVPB (has no administration in time range)  insulin aspart (novoLOG) injection 0-9 Units (has no administration in time range)  insulin aspart (novoLOG) injection 0-5 Units (has no administration in time range)  sodium chloride 0.9 % bolus 500 mL (0 mLs Intravenous Stopped 12/12/19 1108)  iohexol (OMNIPAQUE) 350 MG/ML injection 75 mL (75 mLs Intravenous Contrast Given 12/12/19 1020)  cefTRIAXone (ROCEPHIN) 1 g in sodium chloride 0.9 % 100 mL IVPB (1 g Intravenous New Bag/Given 12/12/19 1246)     ED Discharge Orders    None       Note:  This document was prepared using Dragon voice recognition software and may include unintentional dictation errors.   Vanessa Forest, MD 12/12/19 1341

## 2019-12-13 DIAGNOSIS — Z7984 Long term (current) use of oral hypoglycemic drugs: Secondary | ICD-10-CM | POA: Diagnosis not present

## 2019-12-13 DIAGNOSIS — N1831 Chronic kidney disease, stage 3a: Secondary | ICD-10-CM | POA: Diagnosis present

## 2019-12-13 DIAGNOSIS — J189 Pneumonia, unspecified organism: Secondary | ICD-10-CM | POA: Diagnosis present

## 2019-12-13 DIAGNOSIS — R778 Other specified abnormalities of plasma proteins: Secondary | ICD-10-CM | POA: Diagnosis not present

## 2019-12-13 DIAGNOSIS — R3129 Other microscopic hematuria: Secondary | ICD-10-CM | POA: Diagnosis present

## 2019-12-13 DIAGNOSIS — Z8249 Family history of ischemic heart disease and other diseases of the circulatory system: Secondary | ICD-10-CM | POA: Diagnosis not present

## 2019-12-13 DIAGNOSIS — Z882 Allergy status to sulfonamides status: Secondary | ICD-10-CM | POA: Diagnosis not present

## 2019-12-13 DIAGNOSIS — Z87891 Personal history of nicotine dependence: Secondary | ICD-10-CM | POA: Diagnosis not present

## 2019-12-13 DIAGNOSIS — N401 Enlarged prostate with lower urinary tract symptoms: Secondary | ICD-10-CM | POA: Diagnosis present

## 2019-12-13 DIAGNOSIS — J181 Lobar pneumonia, unspecified organism: Secondary | ICD-10-CM | POA: Diagnosis present

## 2019-12-13 DIAGNOSIS — K219 Gastro-esophageal reflux disease without esophagitis: Secondary | ICD-10-CM | POA: Diagnosis present

## 2019-12-13 DIAGNOSIS — N179 Acute kidney failure, unspecified: Secondary | ICD-10-CM | POA: Diagnosis present

## 2019-12-13 DIAGNOSIS — I248 Other forms of acute ischemic heart disease: Secondary | ICD-10-CM | POA: Diagnosis present

## 2019-12-13 DIAGNOSIS — F329 Major depressive disorder, single episode, unspecified: Secondary | ICD-10-CM | POA: Diagnosis present

## 2019-12-13 DIAGNOSIS — I129 Hypertensive chronic kidney disease with stage 1 through stage 4 chronic kidney disease, or unspecified chronic kidney disease: Secondary | ICD-10-CM | POA: Diagnosis present

## 2019-12-13 DIAGNOSIS — E1122 Type 2 diabetes mellitus with diabetic chronic kidney disease: Secondary | ICD-10-CM | POA: Diagnosis present

## 2019-12-13 DIAGNOSIS — Z833 Family history of diabetes mellitus: Secondary | ICD-10-CM | POA: Diagnosis not present

## 2019-12-13 DIAGNOSIS — G4733 Obstructive sleep apnea (adult) (pediatric): Secondary | ICD-10-CM | POA: Diagnosis present

## 2019-12-13 DIAGNOSIS — Z841 Family history of disorders of kidney and ureter: Secondary | ICD-10-CM | POA: Diagnosis not present

## 2019-12-13 DIAGNOSIS — Z880 Allergy status to penicillin: Secondary | ICD-10-CM | POA: Diagnosis not present

## 2019-12-13 DIAGNOSIS — Z20822 Contact with and (suspected) exposure to covid-19: Secondary | ICD-10-CM | POA: Diagnosis present

## 2019-12-13 DIAGNOSIS — D649 Anemia, unspecified: Secondary | ICD-10-CM | POA: Diagnosis present

## 2019-12-13 DIAGNOSIS — E860 Dehydration: Secondary | ICD-10-CM | POA: Diagnosis present

## 2019-12-13 DIAGNOSIS — Z79899 Other long term (current) drug therapy: Secondary | ICD-10-CM | POA: Diagnosis not present

## 2019-12-13 LAB — LIPID PANEL
Cholesterol: 92 mg/dL (ref 0–200)
HDL: 30 mg/dL — ABNORMAL LOW (ref >40)
LDL Cholesterol: 53 mg/dL (ref 0–99)
Total CHOL/HDL Ratio: 3.1 RATIO
Triglycerides: 44 mg/dL (ref <150)
VLDL: 9 mg/dL (ref 0–40)

## 2019-12-13 LAB — CBC
HCT: 30.2 % — ABNORMAL LOW (ref 39.0–52.0)
Hemoglobin: 10 g/dL — ABNORMAL LOW (ref 13.0–17.0)
MCH: 26.5 pg (ref 26.0–34.0)
MCHC: 33.1 g/dL (ref 30.0–36.0)
MCV: 79.9 fL — ABNORMAL LOW (ref 80.0–100.0)
Platelets: 177 10*3/uL (ref 150–400)
RBC: 3.78 MIL/uL — ABNORMAL LOW (ref 4.22–5.81)
RDW: 13.2 % (ref 11.5–15.5)
WBC: 6 10*3/uL (ref 4.0–10.5)
nRBC: 0 % (ref 0.0–0.2)

## 2019-12-13 LAB — BASIC METABOLIC PANEL
Anion gap: 7 (ref 5–15)
BUN: 21 mg/dL (ref 8–23)
CO2: 23 mmol/L (ref 22–32)
Calcium: 9.2 mg/dL (ref 8.9–10.3)
Chloride: 105 mmol/L (ref 98–111)
Creatinine, Ser: 1.37 mg/dL — ABNORMAL HIGH (ref 0.61–1.24)
GFR calc Af Amer: 57 mL/min — ABNORMAL LOW (ref >60)
GFR calc non Af Amer: 49 mL/min — ABNORMAL LOW (ref >60)
Glucose, Bld: 109 mg/dL — ABNORMAL HIGH (ref 70–99)
Potassium: 4.1 mmol/L (ref 3.5–5.1)
Sodium: 135 mmol/L (ref 135–145)

## 2019-12-13 LAB — GLUCOSE, CAPILLARY
Glucose-Capillary: 106 mg/dL — ABNORMAL HIGH (ref 70–99)
Glucose-Capillary: 96 mg/dL (ref 70–99)
Glucose-Capillary: 122 mg/dL — ABNORMAL HIGH (ref 70–99)
Glucose-Capillary: 130 mg/dL — ABNORMAL HIGH (ref 70–99)

## 2019-12-13 LAB — HEMOGLOBIN A1C
Hgb A1c MFr Bld: 6.7 % — ABNORMAL HIGH (ref 4.8–5.6)
Mean Plasma Glucose: 145.59 mg/dL

## 2019-12-13 LAB — PROCALCITONIN: Procalcitonin: <0.1 ng/mL

## 2019-12-13 LAB — STREP PNEUMONIAE URINARY ANTIGEN: Strep Pneumo Urinary Antigen: NEGATIVE

## 2019-12-13 MED ORDER — GUAIFENESIN ER 600 MG PO TB12
1200.0000 mg | ORAL_TABLET | Freq: Two times a day (BID) | ORAL | Status: DC
  Administered 2019-12-13 – 2019-12-14 (×3): 1200 mg via ORAL
  Filled 2019-12-13 (×3): qty 2

## 2019-12-13 NOTE — Progress Notes (Signed)
PROGRESS NOTE   Ricky Riley  OVZ:[id number]    DOB: 1940/10/03    DOA: 12/12/2019  PCP: Baxter Hire, MD   I have briefly reviewed patients previous medical records in [facility].  Chief Complaint  Patient presents with  . Shortness of Breath    Brief Narrative:   79 y.o.  married male, independent, with medical history significant of hypertension, diabetes mellitus, GERD, depression, BPH, CKD-3, OSA unable to comply with CPAP, who presents with cough, shortness of breath, fever and chills.  Admitted for left lower lobe pneumonia.  Improving.   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  Appears to have failed outpatient antibiotics (azithromycin x4 days) for sinusitis.  CT chest negative for PE but confirmed left lower lobe pneumonia.  Treating empirically with IV ceftriaxone and doxycycline.  Improving.  Continue additional 24 hours of IV antibiotics and then consider transitioning to oral antibiotics at discharge.  He completed COVID-19 vaccination in January 2021.  Urinary pneumococcal antigen negative.  Surprisingly procalcitonin <0.1 but this is after outpatient antibiotics.  Minimally elevated troponin with flat trend likely due to demand ischemia.  Recommend follow-up CT chest in 4 weeks to ensure resolution of pneumonia findings and reported reactive lymph nodes.  There is persistent complete atelectasis/collapse of what appears to be the superior segment of left lower lobe-incentive spirometry.  Acute on  stage IIIa CKD: Likely related to dehydration from pneumonia.  Briefly hydrated with IV fluids.  Presented with creatinine of 1.5, down to 1.3 which is possibly his baseline.  DC IV fluids.  Oral fluid encouraged.  Holding Hyzaar  Essential hypertension: Controlled.  Holding Hyzaar, can resume at discharge.  BPH with LUTS: Continue Terazosin and Proscar.  GERD: Continue Protonix.  Type II DM with renal complications: A1O 6.7 suggests reasonable outpatient control.  Holding Metformin.  Continue SSI, reasonable inpatient control.  Body mass index is 23.37 kg/m.   OSA: Reports that due to nasal congestion, unable to comply with CPAP for the last 4 to 5 months.  Outpatient follow-up with PCP  Anemia: His baseline hemoglobin probably is in the 12 g range.  Now down to 10.  Wonder if it is due to hemodilution.  No bleeding reported.  Follow CBC in a.m.  Ancillary findings on CT chest/abdomen/pelvis: As per detailed report..  Outpatient follow-up for these as deemed necessary.   DVT prophylaxis: Lovenox Code Status: Full Family Communication: None at bedside Disposition:  Status is: Observation  The patient will require care spanning > 2 midnights and should be moved to inpatient because: IV treatments appropriate due to intensity of illness or inability to take PO  Dispo: The patient is from: Home              Anticipated d/c is to: Home              Anticipated d/c date is: 1 day              Patient currently is not medically stable to d/c.        Consultants:  None  Procedures:   None  Antimicrobials:   IV ceftriaxone and doxycycline   Subjective:  Feels better.  Cough improved.  No dyspnea at rest but unable to tell if he has any with activity because he has not ambulated.  Had dyspnea on exertion on presentation yesterday.  No chest pain.  Non-smoker and not on home oxygen.  Objective:   Vitals:   12/12/19 1842 12/12/19 2256 12/13/19 0652 12/13/19 0738  BP:  135/65 (!) 148/61 140/67 134/67  Pulse: 60 62 (!) 51 (!) 49  Resp: 15 18 18 15   Temp: 98.6 F (37 C) 99.1 F (37.3 C) 99 F (37.2 C) 98.5 F (36.9 C)  TempSrc:  Oral Oral Oral  SpO2: 100% 99% 97% 99%  Weight:      Height:        General exam: Pleasant elderly male, moderately built and nourished sitting up comfortably in bed without distress. Respiratory system: Crackles in the left base but otherwise clear to auscultation without wheezing or rhonchi. Respiratory effort normal. Cardiovascular system: S1 & S2 heard, RRR. No JVD, murmurs, rubs, gallops or clicks. No pedal edema. Gastrointestinal system: Abdomen is nondistended, soft and nontender. No organomegaly or masses felt. Normal bowel sounds heard. Central nervous system: Alert and oriented. No focal neurological deficits. Extremities: Symmetric 5 x 5 power. Skin: No rashes, lesions or ulcers Psychiatry: Judgement and insight appear normal. Mood & affect appropriate.     Data Reviewed:   I have personally reviewed following labs and imaging studies   CBC: Recent Labs  Lab 12/12/19 0836 12/13/19 0416  WBC 6.8 6.0  NEUTROABS 4.7  --   HGB 11.2* 10.0*  HCT 34.0* 30.2*  MCV 80.0 79.9*  PLT 181 062    Basic Metabolic Panel: Recent Labs  Lab 12/12/19 0836 12/13/19 0416  NA 131* 135  K 3.6 4.1  CL 99 105  CO2 22 23  GLUCOSE 143* 109*  BUN 24* 21  CREATININE 1.59* 1.37*  CALCIUM 9.8 9.2    Liver Function Tests: Recent Labs  Lab 12/12/19 0836  AST 17  ALT 11  ALKPHOS 51  BILITOT 0.7  PROT 7.6  ALBUMIN 3.6    CBG: Recent Labs  Lab 12/12/19 1630 12/12/19 2103 12/13/19 0758  GLUCAP 107* 117* 106*    Microbiology Studies:   Recent Results (from the past 240 hour(s))  SARS Coronavirus 2 by RT PCR (hospital order, performed in [place] hospital lab) Nasopharyngeal Nasopharyngeal Swab     Status: None   Collection Time: 12/12/19  8:36 AM   Specimen: Nasopharyngeal Swab  Result Value Ref  Range Status   SARS Coronavirus 2 NEGATIVE NEGATIVE Final    Comment: (NOTE) SARS-CoV-2 target nucleic acids are NOT DETECTED.  The SARS-CoV-2 RNA is generally detectable in upper and lower respiratory specimens during the acute phase of infection. The lowest concentration of SARS-CoV-2 viral copies this assay can detect is 250 copies / mL. A negative result does not preclude SARS-CoV-2 infection and should not be used as the sole basis for treatment or other patient management decisions.  A negative result may occur with improper specimen collection / handling, submission of specimen other than nasopharyngeal swab, presence of viral mutation(s) within the areas targeted by this assay, and inadequate number of viral copies (<250 copies / mL). A negative result must be combined with clinical observations, patient history, and epidemiological information.  Fact Sheet for Patients:   StrictlyIdeas.no  Fact Sheet for Healthcare Providers: BankingDealers.co.za  This test is not yet approved or  cleared by the [place] and has been authorized for detection and/or diagnosis of SARS-CoV-2 by FDA under an Emergency Use Authorization (EUA).  This EUA will remain in effect (meaning this test can be used) for the duration of the COVID-19 declaration under Section 564(b)(1) of the Act, 21 U.S.C. section 360bbb-3(b)(1), unless the authorization is terminated or revoked sooner.  Performed at [facility], [address] [place], [place] [address]   Blood culture (routine x 2)     Status: None (Preliminary result)   Collection Time: 12/12/19 12:50 PM   Specimen: BLOOD  Result Value Ref Range Status   Specimen Description BLOOD R FOREARM  Final   Special Requests   Final    BOTTLES DRAWN AEROBIC AND ANAEROBIC Blood Culture adequate volume   Culture   Final    NO GROWTH < 24 HOURS Performed at [facility], [address] [place], [place] [address]    Report Status PENDING  Incomplete  Blood culture (routine x 2)     Status: None (Preliminary result)   Collection Time: 12/12/19 12:50 PM   Specimen: BLOOD  Result Value Ref Range Status   Specimen Description BLOOD LAC  Final   Special Requests   Final    BOTTLES DRAWN AEROBIC AND ANAEROBIC Blood Culture adequate volume   Culture   Final    NO GROWTH < 24 HOURS Performed at [facility], [address] [place], [place] [address]    Report Status PENDING  Incomplete     Radiology Studies:  DG Chest 2 View  Result Date: 12/12/2019 CLINICAL DATA:  Shortness of breath, fever, and chills for 1 week. EXAM: CHEST - 2 VIEW COMPARISON:  CT chest April 29, 2017 FINDINGS: The mediastinal contour and cardiac silhouette are stable. The aorta is tortuous. Minimal atelectasis of both lung bases are noted. There is no focal infiltrate, pulmonary edema, or left pleural effusion. Minimal right pleural effusion is identified. No acute abnormalities identified in the visualized bony structures. IMPRESSION: Minimal atelectasis of both lung bases. Minimal right pleural effusion. Electronically Signed   By: Abelardo Diesel M.D.   On: 12/12/2019 09:33   CT Angio Chest PE W and/or Wo Contrast  Result Date: 12/12/2019 CLINICAL DATA:  79 year old male with a history of shortness of breath EXAM: CT ANGIOGRAPHY CHEST CT ABDOMEN AND PELVIS WITH CONTRAST TECHNIQUE: Multidetector CT imaging of the chest was performed using the standard protocol during bolus administration of intravenous contrast. Multiplanar CT image reconstructions and MIPs were obtained to evaluate the vascular anatomy. Multidetector CT imaging of the abdomen and pelvis was performed using the standard protocol during bolus administration of intravenous contrast. CONTRAST:  20mL OMNIPAQUE IOHEXOL 350 MG/ML SOLN COMPARISON:  Chest x-ray same day, CT chest 04/29/2017 FINDINGS: CTA CHEST FINDINGS  Cardiovascular: Heart: No cardiomegaly. No pericardial fluid/thickening. No significant coronary calcifications. Aorta: Unremarkable course, caliber, contour of the thoracic aorta. No aneurysm or dissection flap. No periaortic fluid. Pulmonary arteries: No central, lobar, segmental, or proximal subsegmental filling defects. Mediastinum/Nodes: Right substernal thyroid nodule again demonstrated. Prior biopsy performed 04/05/2016. No mediastinal adenopathy. Left hilar lymphadenopathy. Unremarkable thoracic esophagus. Lungs/Pleura: Confluent nodular opacity of the left lower lobe, in a predominantly peribronchovascular distribution. No interlobular septal thickening. Subpleural nodule along the fissure on image 62 of series 4, with adjacent subpleural nodule on the lateral pleura on image 64 of series 4. These are larger than the comparison CT  though are likely reactive subpleural lymph nodes. To a lesser degree there is peripheral nodularity of the right lower lobe, dependent distribution. There is persistent complete atelectasis/collapse of what appears to be the superior segment of the left lower lobe. No pleural effusion or pneumothorax.  Upper lobes are clear. CT ABDOMEN and PELVIS FINDINGS Hepatobiliary: Unremarkable liver.  Unremarkable gallbladder. Pancreas: Unremarkable Spleen: Unremarkable Adrenals/Urinary Tract: - Right adrenal gland:  Unremarkable - Left adrenal gland: Unremarkable. - Right kidney: No hydronephrosis, nephrolithiasis, or ureteral dilation. Mild perinephric edema within the fat. Several low-density/nonenhancing cystic structures of the right kidney. The larger of these are compatible with simple cysts. The smaller are incompletely characterized. Relatively unchanged compared to the prior CT. - Left Kidney: No hydronephrosis, nephrolithiasis, or ureteral dilation. Mild perinephric edema of the fat. There are several small hypoenhancing/hypodense renal lesions on the left, all of which are too  small to characterize. - Urinary Bladder: Urinary bladder is unremarkable Stomach/Bowel: - Stomach: Unremarkable. - Small bowel: Unremarkable - Appendix: Normal. - Colon: Colonic diverticula.  No acute inflammatory changes. Vascular/Lymphatic: Atherosclerotic changes of the aorta. Mesenteric arteries and renal arteries are patent. Bilateral iliac arteries and proximal femoral arteries are patent. Unremarkable venous vasculature. Unremarkable portal vasculature. Reproductive: Penile prosthesis. Other: Fat containing umbilical hernia. Musculoskeletal: No acute displaced fracture. No bony canal narrowing. Vacuum disc phenomenon at L5-S1. Mild S-shaped scoliotic curvature is unchanged. Review of the MIP images confirms the above findings. IMPRESSION: Chest CT angiogram is negative for pulmonary emboli. Lobar pneumonia of the left lower lobe with reactive hilar adenopathy. Followup PA and lateral chest X-ray is recommended in 3-4 weeks following therapy to assure resolution. Mild bilateral renal edema, nonspecific. If there is concern for urinary tract infection, recommend correlation with urinalysis. Ancillary findings of the chest/abdomen/pelvis, as above. Electronically Signed   By: Corrie Mckusick D.O.   On: 12/12/2019 10:50   CT ABDOMEN PELVIS W CONTRAST  Result Date: 12/12/2019 CLINICAL DATA:  79 year old male with a history of shortness of breath EXAM: CT ANGIOGRAPHY CHEST CT ABDOMEN AND PELVIS WITH CONTRAST TECHNIQUE: Multidetector CT imaging of the chest was performed using the standard protocol during bolus administration of intravenous contrast. Multiplanar CT image reconstructions and MIPs were obtained to evaluate the vascular anatomy. Multidetector CT imaging of the abdomen and pelvis was performed using the standard protocol during bolus administration of intravenous contrast. CONTRAST:  58mL OMNIPAQUE IOHEXOL 350 MG/ML SOLN COMPARISON:  Chest x-ray same day, CT chest 04/29/2017 FINDINGS: CTA CHEST  FINDINGS Cardiovascular: Heart: No cardiomegaly. No pericardial fluid/thickening. No significant coronary calcifications. Aorta: Unremarkable course, caliber, contour of the thoracic aorta. No aneurysm or dissection flap. No periaortic fluid. Pulmonary arteries: No central, lobar, segmental, or proximal subsegmental filling defects. Mediastinum/Nodes: Right substernal thyroid nodule again demonstrated. Prior biopsy performed 04/05/2016. No mediastinal adenopathy. Left hilar lymphadenopathy. Unremarkable thoracic esophagus. Lungs/Pleura: Confluent nodular opacity of the left lower lobe, in a predominantly peribronchovascular distribution. No interlobular septal thickening. Subpleural nodule along the fissure on image 62 of series 4, with adjacent subpleural nodule on the lateral pleura on image 64 of series 4. These are larger than the comparison CT though are likely reactive subpleural lymph nodes. To a lesser degree there is peripheral nodularity of the right lower lobe, dependent distribution. There is persistent complete atelectasis/collapse of what appears to be the superior segment of the left lower lobe. No pleural effusion or pneumothorax.  Upper lobes are clear. CT ABDOMEN and PELVIS FINDINGS Hepatobiliary: Unremarkable liver.  Unremarkable gallbladder. Pancreas:  Unremarkable Spleen: Unremarkable Adrenals/Urinary Tract: - Right adrenal gland:  Unremarkable - Left adrenal gland: Unremarkable. - Right kidney: No hydronephrosis, nephrolithiasis, or ureteral dilation. Mild perinephric edema within the fat. Several low-density/nonenhancing cystic structures of the right kidney. The larger of these are compatible with simple cysts. The smaller are incompletely characterized. Relatively unchanged compared to the prior CT. - Left Kidney: No hydronephrosis, nephrolithiasis, or ureteral dilation. Mild perinephric edema of the fat. There are several small hypoenhancing/hypodense renal lesions on the left, all of which  are too small to characterize. - Urinary Bladder: Urinary bladder is unremarkable Stomach/Bowel: - Stomach: Unremarkable. - Small bowel: Unremarkable - Appendix: Normal. - Colon: Colonic diverticula.  No acute inflammatory changes. Vascular/Lymphatic: Atherosclerotic changes of the aorta. Mesenteric arteries and renal arteries are patent. Bilateral iliac arteries and proximal femoral arteries are patent. Unremarkable venous vasculature. Unremarkable portal vasculature. Reproductive: Penile prosthesis. Other: Fat containing umbilical hernia. Musculoskeletal: No acute displaced fracture. No bony canal narrowing. Vacuum disc phenomenon at L5-S1. Mild S-shaped scoliotic curvature is unchanged. Review of the MIP images confirms the above findings. IMPRESSION: Chest CT angiogram is negative for pulmonary emboli. Lobar pneumonia of the left lower lobe with reactive hilar adenopathy. Followup PA and lateral chest X-ray is recommended in 3-4 weeks following therapy to assure resolution. Mild bilateral renal edema, nonspecific. If there is concern for urinary tract infection, recommend correlation with urinalysis. Ancillary findings of the chest/abdomen/pelvis, as above. Electronically Signed   By: Corrie Mckusick D.O.   On: 12/12/2019 10:50     Scheduled Meds:   . cholecalciferol  1,000 Units Oral QPC supper  . enoxaparin (LOVENOX) injection  40 mg Subcutaneous Q24H  . finasteride  5 mg Oral Daily  . insulin aspart  0-5 Units Subcutaneous QHS  . insulin aspart  0-9 Units Subcutaneous TID WC  . loratadine  10 mg Oral Daily  . pantoprazole  40 mg Oral Daily  . terazosin  10 mg Oral QHS    Continuous Infusions:   . sodium chloride 75 mL/hr at 12/13/19 0700  . cefTRIAXone (ROCEPHIN)  IV 1 g (12/13/19 0830)  . doxycycline (VIBRAMYCIN) IV Stopped (12/13/19 0251)     LOS: 0 days     Vernell Leep, MD, [place], [facility]    To contact the attending provider between 7A-7P or the covering  provider during after hours 7P-7A, please log into the web site [email or web address] and access using universal [place] password for that web site. If you do not have the password, please call the hospital operator.  12/13/2019, 10:09 AM

## 2019-12-14 DIAGNOSIS — J181 Lobar pneumonia, unspecified organism: Principal | ICD-10-CM

## 2019-12-14 LAB — CBC
HCT: 30.6 % — ABNORMAL LOW (ref 39.0–52.0)
Hemoglobin: 10.1 g/dL — ABNORMAL LOW (ref 13.0–17.0)
MCH: 26.3 pg (ref 26.0–34.0)
MCHC: 33 g/dL (ref 30.0–36.0)
MCV: 79.7 fL — ABNORMAL LOW (ref 80.0–100.0)
Platelets: 184 10*3/uL (ref 150–400)
RBC: 3.84 MIL/uL — ABNORMAL LOW (ref 4.22–5.81)
RDW: 13.1 % (ref 11.5–15.5)
WBC: 5.4 10*3/uL (ref 4.0–10.5)
nRBC: 0 % (ref 0.0–0.2)

## 2019-12-14 LAB — BASIC METABOLIC PANEL
Anion gap: 6 (ref 5–15)
BUN: 20 mg/dL (ref 8–23)
CO2: 23 mmol/L (ref 22–32)
Calcium: 9.5 mg/dL (ref 8.9–10.3)
Chloride: 105 mmol/L (ref 98–111)
Creatinine, Ser: 1.22 mg/dL (ref 0.61–1.24)
GFR calc Af Amer: >60 mL/min (ref >60)
GFR calc non Af Amer: 57 mL/min — ABNORMAL LOW (ref >60)
Glucose, Bld: 104 mg/dL — ABNORMAL HIGH (ref 70–99)
Potassium: 4.2 mmol/L (ref 3.5–5.1)
Sodium: 134 mmol/L — ABNORMAL LOW (ref 135–145)

## 2019-12-14 LAB — GLUCOSE, CAPILLARY
Glucose-Capillary: 102 mg/dL — ABNORMAL HIGH (ref 70–99)
Glucose-Capillary: 98 mg/dL (ref 70–99)
Glucose-Capillary: 126 mg/dL — ABNORMAL HIGH (ref 70–99)

## 2019-12-14 LAB — PROCALCITONIN: Procalcitonin: <0.1 ng/mL

## 2019-12-14 MED ORDER — CEFPODOXIME PROXETIL 200 MG PO TABS: 200.0000 mg | ORAL_TABLET | Freq: Two times a day (BID) | ORAL | 0 refills | Status: AC

## 2019-12-14 MED ORDER — SODIUM CHLORIDE 0.9 % IV SOLN
INTRAVENOUS | Status: DC | PRN
  Administered 2019-12-14: 1000 mL via INTRAVENOUS

## 2019-12-14 MED ORDER — DOXYCYCLINE HYCLATE 100 MG PO CAPS
100.0000 mg | ORAL_CAPSULE | Freq: Two times a day (BID) | ORAL | 0 refills | Status: AC
Start: 2019-12-14 — End: 2019-12-17

## 2019-12-14 NOTE — Discharge Instructions (Signed)

## 2019-12-14 NOTE — Progress Notes (Signed)
Discharge note: Reviewed instructions with Pt. Pt verbalized understanding. Pt leaving facility with personal belongings. Pt leaving with a [employer] cabin crew.  Staff escorting pt out of facility. Pt transported self to home via private vehicle.

## 2019-12-14 NOTE — Progress Notes (Deleted)
Notified MD of pt's elevating CBG:425. Per MD Given highest dose on sliding scare (15 units) with 3 units scheduled for lunch, Now. Will administer new order of lantus 5 units per order post lunch.

## 2019-12-14 NOTE — Discharge Summary (Signed)
Physician Discharge Summary  Ricky Riley ZOX:[id number] DOB: 1940/08/24  PCP: Baxter Hire, MD  Admitted from: Home Discharged to: Home  Admit date: 12/12/2019 Discharge date: 12/14/2019  Recommendations for Outpatient Follow-up:    Follow-up Information    Baxter Hire, MD. Schedule an appointment as soon as possible for a visit in 1 week.   Specialty: Internal Medicine Why: To be seen with repeat labs (CBC & BMP).  Recommend follow-up CT chest in 4 weeks to ensure resolution of pneumonia findings;  Office is Closed on 12/14/19;  Patient to call for Appt.  Contact information: [place] [address] [place]: None Equipment/Devices: None  Discharge Condition: Improved and stable CODE STATUS: Full Diet recommendation: Heart healthy & diabetic diet.  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 married male, independent,with medical history significant ofhypertension, diabetes mellitus, GERD, depression, BPH, CKD-3, OSA unable to comply with CPAP, who presents with cough, shortness of breath, fever and chills.  Admitted for left lower lobe pneumonia.  Assessment & Plan:   Lobar pneumonia (LLL)/community-acquired pneumonia: Presented with fever, chills, mostly nonproductive cough and dyspnea.  Appears to have failed outpatient antibiotics (azithromycin x4 days) for sinusitis.  CT chest negative for PE but confirmed left lower lobe pneumonia.  Treated empirically with IV ceftriaxone and doxycycline and completed almost 3  days course. He completed COVID-19 vaccination in January 2021.  Urinary pneumococcal antigen negative.  Surprisingly procalcitonin <0.1 but this is after outpatient antibiotics.  Minimally elevated troponin with flat trend likely due to demand ischemia.  Recommend follow-up CT chest (because this did not show up on chest x-ray) in 4 weeks to ensure resolution of pneumonia findings and reported reactive lymph nodes.  There is persistent complete atelectasis/collapse of what appears to be the superior segment of left lower lobe-incentive spirometry.  At discharge transition to [facility] and doxycycline orally to complete total 5 days course.  Clinically improved.  Acute on stage IIIa CKD: Likely related to dehydration from pneumonia.  Briefly hydrated with IV fluids.  Presented with creatinine of 1.5, down to 1.3 which is possibly his baseline.  DC IV fluids.  Oral fluid encouraged.  Resume prior home dose of HCTZ and ARB which he is on likely for his hypertension and nephro protection from DM.  Monitor BMP closely as outpatient.  Essential hypertension: Controlled.  Resume prior home dose of HCTZ and ARB.  BPH with LUTS: Continue Terazosin and Proscar.  GERD: Continue Protonix.  Does have some reflux symptoms.  Type II DM with renal complications: Y7W 6.7 suggests reasonable outpatient control.  Holding Metformin.  Continue SSI, reasonable inpatient control.  Resume Metformin at discharge.  Body mass index is 23.37 kg/m.   OSA: Reports that due to nasal congestion, unable to comply with CPAP for the last 4 to 5 months.  Outpatient follow-up with PCP  Anemia: His baseline hemoglobin probably is in the 12 g range.  Now down to 10.  Wonder if it is due to hemodilution.  No bleeding  reported.  Stable.  Ancillary findings on CT chest/abdomen/pelvis: As per detailed report..  Outpatient follow-up for these as deemed necessary with PCP.  Microscopic hematuria: Urine microscopy showed 6-10 RBC per  high-power field.  Unclear etiology.  Recommend repeating urine microscopy in 2 to 3 weeks and if persistent consider further evaluation including urology consultation.    Consultants:   None  Procedures:   None   Discharge Instructions  Discharge Instructions    Call MD for:  difficulty breathing, headache or visual disturbances   Complete by: As directed    Call MD for:  extreme fatigue   Complete by: As directed    Call MD for:  persistant dizziness or light-headedness   Complete by: As directed    Call MD for:  temperature >100.4   Complete by: As directed    Diet - low sodium heart healthy   Complete by: As directed    Diet Carb Modified   Complete by: As directed    Increase activity slowly   Complete by: As directed        Medication List    STOP taking these medications   azithromycin 250 MG tablet Commonly known as: ZITHROMAX     TAKE these medications   acetaminophen 500 MG tablet Commonly known as: TYLENOL Take 500 mg by mouth every 6 (six) hours as needed.   benzonatate 200 MG capsule Commonly known as: TESSALON Take by mouth.   cefpodoxime 200 MG tablet Commonly known as: VANTIN Take 1 tablet (200 mg total) by mouth 2 (two) times daily for 2 days. Start taking on: December 15, 2019   cetirizine 10 MG tablet Commonly known as: ZYRTEC Take 10 mg by mouth daily as needed for allergies.   cholecalciferol 1000 units tablet Commonly known as: VITAMIN D Take 1,000 Units by mouth daily after supper.   doxycycline 100 MG capsule Commonly known as: Vibramycin Take 1 capsule (100 mg total) by mouth 2 (two) times daily for 5 doses.   finasteride 5 MG tablet Commonly known as: PROSCAR Take 5 mg by mouth daily.   hydrochlorothiazide 25 MG tablet Commonly known as: HYDRODIURIL Take 12.5 mg by mouth daily.   losartan 25 MG tablet Commonly known as: COZAAR Take 25 mg by mouth daily.   metFORMIN 500 MG tablet Commonly known as: GLUCOPHAGE Take  500 mg by mouth daily with breakfast.   mirtazapine 15 MG tablet Commonly known as: REMERON Take 15 mg by mouth at bedtime as needed (sleep).   omeprazole 20 MG capsule Commonly known as: PRILOSEC Take 20 mg by mouth daily before breakfast.   sodium chloride 0.65 % Soln nasal spray Commonly known as: OCEAN Place 1 spray into both nostrils as needed for congestion.   terazosin 10 MG capsule Commonly known as: HYTRIN Take 10 mg by mouth at bedtime.      Allergies  Allergen Reactions  . Ciprofloxacin Hives  . Penicillins Hives    Did it involve swelling of the face/tongue/throat, SOB, or low BP? No Did it involve sudden or severe rash/hives, skin peeling, or any reaction on the inside of your mouth or nose? Unknown Did you need to seek medical attention at a hospital or doctor's office? No When did it last happen?30-40 years ago If all above answers are "NO", may proceed with cephalosporin use.   . Sulfa Antibiotics Hives  . Sulfasalazine Hives      Procedures/Studies: DG Chest 2 View  Result Date: 12/12/2019 CLINICAL DATA:  Shortness of breath, fever, and chills for 1 week. EXAM: CHEST - 2 VIEW COMPARISON:  CT chest April 29, 2017 FINDINGS: The mediastinal contour and cardiac silhouette are stable. The aorta is tortuous. Minimal atelectasis of both lung bases are noted. There is no focal infiltrate, pulmonary edema, or left pleural effusion. Minimal right pleural effusion is identified. No acute abnormalities identified in the visualized bony structures. IMPRESSION: Minimal atelectasis of both lung bases. Minimal right pleural effusion. Electronically Signed   By: Abelardo Diesel M.D.   On: 12/12/2019 09:33   CT Angio Chest PE W and/or Wo Contrast  Result Date: 12/12/2019 CLINICAL DATA:  79 year old male with a history of shortness of breath EXAM: CT ANGIOGRAPHY CHEST CT ABDOMEN AND PELVIS WITH CONTRAST TECHNIQUE: Multidetector CT imaging of the chest was performed using the  standard protocol during bolus administration of intravenous contrast. Multiplanar CT image reconstructions and MIPs were obtained to evaluate the vascular anatomy. Multidetector CT imaging of the abdomen and pelvis was performed using the standard protocol during bolus administration of intravenous contrast. CONTRAST:  61mL OMNIPAQUE IOHEXOL 350 MG/ML SOLN COMPARISON:  Chest x-ray same day, CT chest 04/29/2017 FINDINGS: CTA CHEST FINDINGS Cardiovascular: Heart: No cardiomegaly. No pericardial fluid/thickening. No significant coronary calcifications. Aorta: Unremarkable course, caliber, contour of the thoracic aorta. No aneurysm or dissection flap. No periaortic fluid. Pulmonary arteries: No central, lobar, segmental, or proximal subsegmental filling defects. Mediastinum/Nodes: Right substernal thyroid nodule again demonstrated. Prior biopsy performed 04/05/2016. No mediastinal adenopathy. Left hilar lymphadenopathy. Unremarkable thoracic esophagus. Lungs/Pleura: Confluent nodular opacity of the left lower lobe, in a predominantly peribronchovascular distribution. No interlobular septal thickening. Subpleural nodule along the fissure on image 62 of series 4, with adjacent subpleural nodule on the lateral pleura on image 64 of series 4. These are larger than the comparison CT though are likely reactive subpleural lymph nodes. To a lesser degree there is peripheral nodularity of the right lower lobe, dependent distribution. There is persistent complete atelectasis/collapse of what appears to be the superior segment of the left lower lobe. No pleural effusion or pneumothorax.  Upper lobes are clear. CT ABDOMEN and PELVIS FINDINGS Hepatobiliary: Unremarkable liver.  Unremarkable gallbladder. Pancreas: Unremarkable Spleen: Unremarkable Adrenals/Urinary Tract: - Right adrenal gland:  Unremarkable - Left adrenal gland: Unremarkable. - Right kidney: No hydronephrosis, nephrolithiasis, or ureteral dilation. Mild perinephric  edema within the fat. Several low-density/nonenhancing cystic structures of the right kidney. The larger of these are compatible with simple cysts. The smaller are incompletely characterized. Relatively unchanged compared to the prior CT. - Left Kidney: No hydronephrosis, nephrolithiasis, or ureteral dilation. Mild perinephric edema of the fat. There are several small hypoenhancing/hypodense renal lesions on the left, all of which are too small to characterize. - Urinary Bladder: Urinary bladder is unremarkable Stomach/Bowel: - Stomach: Unremarkable. - Small bowel: Unremarkable - Appendix: Normal. - Colon: Colonic diverticula.  No acute inflammatory changes. Vascular/Lymphatic: Atherosclerotic changes of the aorta. Mesenteric arteries and renal arteries are patent. Bilateral iliac arteries and proximal femoral arteries are patent. Unremarkable venous vasculature. Unremarkable portal vasculature. Reproductive: Penile prosthesis. Other: Fat containing umbilical hernia. Musculoskeletal: No acute displaced fracture. No bony canal narrowing. Vacuum disc phenomenon at L5-S1. Mild S-shaped scoliotic curvature is unchanged. Review of the MIP images confirms the above findings. IMPRESSION: Chest CT angiogram is negative for pulmonary emboli. Lobar pneumonia of the left lower lobe with reactive hilar adenopathy. Followup PA and lateral chest X-ray is recommended in 3-4 weeks following therapy to assure resolution. Mild bilateral renal edema,  nonspecific. If there is concern for urinary tract infection, recommend correlation with urinalysis. Ancillary findings of the chest/abdomen/pelvis, as above. Electronically Signed   By: Corrie Mckusick D.O.   On: 12/12/2019 10:50   CT ABDOMEN PELVIS W CONTRAST  Result Date: 12/12/2019 CLINICAL DATA:  79 year old male with a history of shortness of breath EXAM: CT ANGIOGRAPHY CHEST CT ABDOMEN AND PELVIS WITH CONTRAST TECHNIQUE: Multidetector CT imaging of the chest was performed using  the standard protocol during bolus administration of intravenous contrast. Multiplanar CT image reconstructions and MIPs were obtained to evaluate the vascular anatomy. Multidetector CT imaging of the abdomen and pelvis was performed using the standard protocol during bolus administration of intravenous contrast. CONTRAST:  76mL OMNIPAQUE IOHEXOL 350 MG/ML SOLN COMPARISON:  Chest x-ray same day, CT chest 04/29/2017 FINDINGS: CTA CHEST FINDINGS Cardiovascular: Heart: No cardiomegaly. No pericardial fluid/thickening. No significant coronary calcifications. Aorta: Unremarkable course, caliber, contour of the thoracic aorta. No aneurysm or dissection flap. No periaortic fluid. Pulmonary arteries: No central, lobar, segmental, or proximal subsegmental filling defects. Mediastinum/Nodes: Right substernal thyroid nodule again demonstrated. Prior biopsy performed 04/05/2016. No mediastinal adenopathy. Left hilar lymphadenopathy. Unremarkable thoracic esophagus. Lungs/Pleura: Confluent nodular opacity of the left lower lobe, in a predominantly peribronchovascular distribution. No interlobular septal thickening. Subpleural nodule along the fissure on image 62 of series 4, with adjacent subpleural nodule on the lateral pleura on image 64 of series 4. These are larger than the comparison CT though are likely reactive subpleural lymph nodes. To a lesser degree there is peripheral nodularity of the right lower lobe, dependent distribution. There is persistent complete atelectasis/collapse of what appears to be the superior segment of the left lower lobe. No pleural effusion or pneumothorax.  Upper lobes are clear. CT ABDOMEN and PELVIS FINDINGS Hepatobiliary: Unremarkable liver.  Unremarkable gallbladder. Pancreas: Unremarkable Spleen: Unremarkable Adrenals/Urinary Tract: - Right adrenal gland:  Unremarkable - Left adrenal gland: Unremarkable. - Right kidney: No hydronephrosis, nephrolithiasis, or ureteral dilation. Mild  perinephric edema within the fat. Several low-density/nonenhancing cystic structures of the right kidney. The larger of these are compatible with simple cysts. The smaller are incompletely characterized. Relatively unchanged compared to the prior CT. - Left Kidney: No hydronephrosis, nephrolithiasis, or ureteral dilation. Mild perinephric edema of the fat. There are several small hypoenhancing/hypodense renal lesions on the left, all of which are too small to characterize. - Urinary Bladder: Urinary bladder is unremarkable Stomach/Bowel: - Stomach: Unremarkable. - Small bowel: Unremarkable - Appendix: Normal. - Colon: Colonic diverticula.  No acute inflammatory changes. Vascular/Lymphatic: Atherosclerotic changes of the aorta. Mesenteric arteries and renal arteries are patent. Bilateral iliac arteries and proximal femoral arteries are patent. Unremarkable venous vasculature. Unremarkable portal vasculature. Reproductive: Penile prosthesis. Other: Fat containing umbilical hernia. Musculoskeletal: No acute displaced fracture. No bony canal narrowing. Vacuum disc phenomenon at L5-S1. Mild S-shaped scoliotic curvature is unchanged. Review of the MIP images confirms the above findings. IMPRESSION: Chest CT angiogram is negative for pulmonary emboli. Lobar pneumonia of the left lower lobe with reactive hilar adenopathy. Followup PA and lateral chest X-ray is recommended in 3-4 weeks following therapy to assure resolution. Mild bilateral renal edema, nonspecific. If there is concern for urinary tract infection, recommend correlation with urinalysis. Ancillary findings of the chest/abdomen/pelvis, as above. Electronically Signed   By: Corrie Mckusick D.O.   On: 12/12/2019 10:50      Subjective: Overall feels better.  No dyspnea.  Cough minimal and dry.  No chest pain, fever, chills.  As per RN, no  acute issues noted.  States that at home he is physically quite active.  Discharge Exam:  Vitals:   12/13/19 0738  12/13/19 1511 12/13/19 2332 12/14/19 0741  BP: 134/67 140/71 (!) 152/69 (!) 135/57  Pulse: (!) 49 (!) 58 (!) 55 (!) 48  Resp: 15 16 18 16   Temp: 98.5 F (36.9 C) 98.1 F (36.7 C) 98.6 F (37 C) 98 F (36.7 C)  TempSrc: Oral Oral Oral Oral  SpO2: 99% 100% 98% 99%  Weight:      Height:        General exam: Pleasant elderly male, moderately built and nourished sitting up comfortably in bed without distress. Respiratory system: Crackles in the left base (less than yesterday) but otherwise clear to auscultation without wheezing or rhonchi. Respiratory effort normal. Cardiovascular system: S1 & S2 heard, RRR. No JVD, murmurs, rubs, gallops or clicks. No pedal edema. Gastrointestinal system: Abdomen is nondistended, soft and nontender. No organomegaly or masses felt. Normal bowel sounds heard. Central nervous system: Alert and oriented. No focal neurological deficits. Extremities: Symmetric 5 x 5 power. Skin: No rashes, lesions or ulcers Psychiatry: Judgement and insight appear normal. Mood & affect appropriate.     The results of significant diagnostics from this hospitalization (including imaging, microbiology, ancillary and laboratory) are listed below for reference.     Microbiology: Recent Results (from the past 240 hour(s))  SARS Coronavirus 2 by RT PCR (hospital order, performed in [facility] hospital lab) Nasopharyngeal Nasopharyngeal Swab     Status: None   Collection Time: 12/12/19  8:36 AM   Specimen: Nasopharyngeal Swab  Result Value Ref Range Status   SARS Coronavirus 2 NEGATIVE NEGATIVE Final    Comment: (NOTE) SARS-CoV-2 target nucleic acids are NOT DETECTED.  The SARS-CoV-2 RNA is generally detectable in upper and lower respiratory specimens during the acute phase of infection. The lowest concentration of SARS-CoV-2 viral copies this assay can detect is 250 copies / mL. A negative result does not preclude SARS-CoV-2 infection and should not be used as the sole  basis for treatment or other patient management decisions.  A negative result may occur with improper specimen collection / handling, submission of specimen other than nasopharyngeal swab, presence of viral mutation(s) within the areas targeted by this assay, and inadequate number of viral copies (<250 copies / mL). A negative result must be combined with clinical observations, patient history, and epidemiological information.  Fact Sheet for Patients:   StrictlyIdeas.no  Fact Sheet for Healthcare Providers: BankingDealers.co.za  This test is not yet approved or  cleared by the [place] FDA and has been authorized for detection and/or diagnosis of SARS-CoV-2 by FDA under an Emergency Use Authorization (EUA).  This EUA will remain in effect (meaning this test can be used) for the duration of the COVID-19 declaration under Section 564(b)(1) of the Act, 21 U.S.C. section 360bbb-3(b)(1), unless the authorization is terminated or revoked sooner.  Performed at [facility], [address] [place], [place] [address]   Blood culture (routine x 2)     Status: None (Preliminary result)   Collection Time: 12/12/19 12:50 PM   Specimen: BLOOD  Result Value Ref Range Status   Specimen Description BLOOD R FOREARM  Final   Special Requests   Final    BOTTLES DRAWN AEROBIC AND ANAEROBIC Blood Culture adequate volume   Culture   Final    NO GROWTH 2 DAYS Performed at [facility], [address], [place], [place] [address]  Report Status PENDING  Incomplete  Blood culture (routine x 2)     Status: None (Preliminary result)   Collection Time: 12/12/19 12:50 PM   Specimen: BLOOD  Result Value Ref Range Status   Specimen Description BLOOD LAC  Final   Special Requests   Final    BOTTLES DRAWN AEROBIC AND ANAEROBIC Blood Culture adequate volume   Culture   Final    NO GROWTH 2 DAYS Performed at [facility],  [name], [place] [address]    Report Status PENDING  Incomplete     Labs: CBC: Recent Labs  Lab 12/12/19 0836 12/13/19 0416 12/14/19 0641  WBC 6.8 6.0 5.4  NEUTROABS 4.7  --   --   HGB 11.2* 10.0* 10.1*  HCT 34.0* 30.2* 30.6*  MCV 80.0 79.9* 79.7*  PLT 181 177 212    Basic Metabolic Panel: Recent Labs  Lab 12/12/19 0836 12/13/19 0416 12/14/19 0641  NA 131* 135 134*  K 3.6 4.1 4.2  CL 99 105 105  CO2 22 23 23   GLUCOSE 143* 109* 104*  BUN 24* 21 20  CREATININE 1.59* 1.37* 1.22  CALCIUM 9.8 9.2 9.5    Liver Function Tests: Recent Labs  Lab 12/12/19 0836  AST 17  ALT 11  ALKPHOS 51  BILITOT 0.7  PROT 7.6  ALBUMIN 3.6    CBG: Recent Labs  Lab 12/13/19 1151 12/13/19 1548 12/13/19 2102 12/14/19 0743 12/14/19 1210  GLUCAP 96 122* 130* 102* 98    Hgb A1c Recent Labs    12/13/19 0416  HGBA1C 6.7*    Lipid Profile Recent Labs    12/13/19 0416  CHOL 92  HDL 30*  LDLCALC 53  TRIG 44  CHOLHDL 3.1     Urinalysis    Component Value Date/Time   COLORURINE YELLOW (A) 12/12/2019 1109   APPEARANCEUR CLEAR (A) 12/12/2019 1109   LABSPEC 1.019 12/12/2019 1109   PHURINE 5.0 12/12/2019 1109   GLUCOSEU NEGATIVE 12/12/2019 1109   HGBUR SMALL (A) 12/12/2019 1109   BILIRUBINUR NEGATIVE 12/12/2019 1109   KETONESUR NEGATIVE 12/12/2019 1109   PROTEINUR 30 (A) 12/12/2019 1109   NITRITE NEGATIVE 12/12/2019 1109   LEUKOCYTESUR NEGATIVE 12/12/2019 1109      Time coordinating discharge: 40 minutes  SIGNED:  Vernell Leep, MD, FACP, [facility]. [facility]  To contact the attending provider between 7A-7P or the covering provider during after hours 7P-7A, please log into the web site [email or web address] and access using universal [place] password for that web site. If you do not have the password, please call the hospital operator.

## 2019-12-16 LAB — LEGIONELLA PNEUMOPHILA SEROGP 1 UR AG: L. pneumophila Serogp 1 Ur Ag: NEGATIVE

## 2019-12-17 LAB — CULTURE, BLOOD (ROUTINE X 2)
Culture: NO GROWTH
Culture: NO GROWTH
Special Requests: ADEQUATE
Special Requests: ADEQUATE

## 2020-01-25 ENCOUNTER — Other Ambulatory Visit
Admission: RE | Admit: 2020-01-25 | Discharge: 2020-01-25 | Disposition: A | Discharge status: Home / Self Care | Payer: Medicare Other | Source: Ambulatory Visit | Attending: Cardiology | Admitting: Cardiology

## 2020-01-25 ENCOUNTER — Other Ambulatory Visit: Payer: Self-pay

## 2020-01-25 DIAGNOSIS — Z01812 Encounter for preprocedural laboratory examination: Secondary | ICD-10-CM | POA: Diagnosis present

## 2020-01-25 DIAGNOSIS — Z20822 Contact with and (suspected) exposure to covid-19: Secondary | ICD-10-CM | POA: Insufficient documentation

## 2020-01-25 LAB — SARS CORONAVIRUS 2 (TAT 6-24 HRS): SARS Coronavirus 2: NEGATIVE

## 2020-01-26 ENCOUNTER — Other Ambulatory Visit: Payer: Self-pay | Admitting: Cardiology

## 2020-01-26 MED ORDER — SODIUM CHLORIDE 0.9% FLUSH: 3.0000 mL | Freq: Two times a day (BID) | INTRAVENOUS | Status: AC

## 2020-01-26 MED ORDER — SODIUM CHLORIDE 0.9 % WEIGHT BASED INFUSION: 1.0000 mL/kg/h | INTRAVENOUS | Status: AC

## 2020-01-26 MED ORDER — ASPIRIN 81 MG PO CHEW: 81.0000 mg | CHEWABLE_TABLET | ORAL | Status: AC

## 2020-01-26 MED ORDER — SODIUM CHLORIDE 0.9 % WEIGHT BASED INFUSION: 3.0000 mL/kg/h | INTRAVENOUS | Status: AC

## 2020-01-26 MED ORDER — SODIUM CHLORIDE 0.9% FLUSH: 3.0000 mL | INTRAVENOUS | Status: AC | PRN

## 2020-01-26 MED ORDER — SODIUM CHLORIDE 0.9 % IV SOLN: 250.0000 mL | INTRAVENOUS | Status: AC | PRN

## 2020-01-27 ENCOUNTER — Encounter
Admission: RE | Disposition: A | Discharge status: Home / Self Care | Payer: Self-pay | Source: Home / Self Care | Attending: Cardiology

## 2020-01-27 ENCOUNTER — Ambulatory Visit
Admission: RE | Admit: 2020-01-27 | Discharge: 2020-01-27 | Disposition: A | Discharge status: Home / Self Care | Payer: Medicare Other | Attending: Cardiology | Admitting: Cardiology

## 2020-01-27 ENCOUNTER — Other Ambulatory Visit: Payer: Self-pay

## 2020-01-27 ENCOUNTER — Encounter: Payer: Self-pay | Admitting: Cardiology

## 2020-01-27 DIAGNOSIS — K219 Gastro-esophageal reflux disease without esophagitis: Secondary | ICD-10-CM | POA: Diagnosis not present

## 2020-01-27 DIAGNOSIS — Z87891 Personal history of nicotine dependence: Secondary | ICD-10-CM | POA: Insufficient documentation

## 2020-01-27 DIAGNOSIS — R002 Palpitations: Secondary | ICD-10-CM | POA: Insufficient documentation

## 2020-01-27 DIAGNOSIS — R0609 Other forms of dyspnea: Secondary | ICD-10-CM | POA: Insufficient documentation

## 2020-01-27 DIAGNOSIS — Z79899 Other long term (current) drug therapy: Secondary | ICD-10-CM | POA: Diagnosis not present

## 2020-01-27 DIAGNOSIS — G4730 Sleep apnea, unspecified: Secondary | ICD-10-CM | POA: Diagnosis not present

## 2020-01-27 DIAGNOSIS — I10 Essential (primary) hypertension: Secondary | ICD-10-CM | POA: Insufficient documentation

## 2020-01-27 DIAGNOSIS — Z8249 Family history of ischemic heart disease and other diseases of the circulatory system: Secondary | ICD-10-CM | POA: Insufficient documentation

## 2020-01-27 DIAGNOSIS — Z7984 Long term (current) use of oral hypoglycemic drugs: Secondary | ICD-10-CM | POA: Diagnosis not present

## 2020-01-27 DIAGNOSIS — Z881 Allergy status to other antibiotic agents status: Secondary | ICD-10-CM | POA: Insufficient documentation

## 2020-01-27 DIAGNOSIS — N400 Enlarged prostate without lower urinary tract symptoms: Secondary | ICD-10-CM | POA: Diagnosis not present

## 2020-01-27 DIAGNOSIS — Z888 Allergy status to other drugs, medicaments and biological substances status: Secondary | ICD-10-CM | POA: Insufficient documentation

## 2020-01-27 DIAGNOSIS — Z9079 Acquired absence of other genital organ(s): Secondary | ICD-10-CM | POA: Insufficient documentation

## 2020-01-27 DIAGNOSIS — E119 Type 2 diabetes mellitus without complications: Secondary | ICD-10-CM | POA: Diagnosis not present

## 2020-01-27 DIAGNOSIS — Z880 Allergy status to penicillin: Secondary | ICD-10-CM | POA: Insufficient documentation

## 2020-01-27 DIAGNOSIS — R9439 Abnormal result of other cardiovascular function study: Secondary | ICD-10-CM | POA: Diagnosis present

## 2020-01-27 DIAGNOSIS — R0789 Other chest pain: Secondary | ICD-10-CM | POA: Diagnosis not present

## 2020-01-27 DIAGNOSIS — Z882 Allergy status to sulfonamides status: Secondary | ICD-10-CM | POA: Insufficient documentation

## 2020-01-27 DIAGNOSIS — Z833 Family history of diabetes mellitus: Secondary | ICD-10-CM | POA: Diagnosis not present

## 2020-01-27 HISTORY — PX: LEFT HEART CATH AND CORONARY ANGIOGRAPHY: CATH118249

## 2020-01-27 LAB — GLUCOSE, CAPILLARY
Glucose-Capillary: 112 mg/dL — ABNORMAL HIGH (ref 70–99)
Glucose-Capillary: 114 mg/dL — ABNORMAL HIGH (ref 70–99)

## 2020-01-27 SURGERY — LEFT HEART CATH AND CORONARY ANGIOGRAPHY
Anesthesia: Moderate Sedation | Laterality: Left

## 2020-01-27 MED ORDER — SODIUM CHLORIDE 0.9 % WEIGHT BASED INFUSION: INTRAVENOUS | Status: AC

## 2020-01-27 MED ORDER — ONDANSETRON HCL 4 MG/2ML IJ SOLN
INTRAMUSCULAR | Status: AC
  Filled 2020-01-27: qty 2

## 2020-01-27 MED ORDER — SODIUM CHLORIDE 0.9 % IV SOLN: 250.0000 mL | INTRAVENOUS | Status: DC | PRN

## 2020-01-27 MED ORDER — HYDRALAZINE HCL 20 MG/ML IJ SOLN
INTRAMUSCULAR | Status: DC | PRN
  Administered 2020-01-27 (×2): 10 mg via INTRAVENOUS

## 2020-01-27 MED ORDER — ATROPINE SULFATE 1 MG/10ML IJ SOSY
PREFILLED_SYRINGE | INTRAMUSCULAR | Status: AC
  Administered 2020-01-27: 0.5 mg via INTRAVENOUS
  Filled 2020-01-27: qty 10

## 2020-01-27 MED ORDER — SODIUM CHLORIDE 0.9 % WEIGHT BASED INFUSION: 1.0000 mL/kg/h | INTRAVENOUS | Status: DC

## 2020-01-27 MED ORDER — FENTANYL CITRATE (PF) 100 MCG/2ML IJ SOLN
INTRAMUSCULAR | Status: DC | PRN
  Administered 2020-01-27: 25 ug via INTRAVENOUS

## 2020-01-27 MED ORDER — ACETAMINOPHEN 325 MG PO TABS: 650.0000 mg | ORAL_TABLET | ORAL | Status: DC | PRN

## 2020-01-27 MED ORDER — SODIUM CHLORIDE 0.9 % WEIGHT BASED INFUSION: INTRAVENOUS | Status: DC

## 2020-01-27 MED ORDER — ASPIRIN 81 MG PO CHEW: 81.0000 mg | CHEWABLE_TABLET | ORAL | Status: DC

## 2020-01-27 MED ORDER — MIDAZOLAM HCL 2 MG/2ML IJ SOLN
INTRAMUSCULAR | Status: AC
  Filled 2020-01-27: qty 2

## 2020-01-27 MED ORDER — SODIUM CHLORIDE 0.9% FLUSH: 3.0000 mL | Freq: Two times a day (BID) | INTRAVENOUS | Status: DC

## 2020-01-27 MED ORDER — IOHEXOL 300 MG/ML  SOLN
INTRAMUSCULAR | Status: DC | PRN
  Administered 2020-01-27: 65 mL

## 2020-01-27 MED ORDER — HYDRALAZINE HCL 20 MG/ML IJ SOLN
INTRAMUSCULAR | Status: AC
  Filled 2020-01-27: qty 1

## 2020-01-27 MED ORDER — LABETALOL HCL 5 MG/ML IV SOLN: 10.0000 mg | INTRAVENOUS | Status: DC | PRN

## 2020-01-27 MED ORDER — ATROPINE SULFATE 1 MG/10ML IJ SOSY: 0.5000 mg | PREFILLED_SYRINGE | Freq: Once | INTRAMUSCULAR | Status: AC

## 2020-01-27 MED ORDER — SODIUM CHLORIDE 0.9% FLUSH: 3.0000 mL | INTRAVENOUS | Status: DC | PRN

## 2020-01-27 MED ORDER — HYDRALAZINE HCL 20 MG/ML IJ SOLN: 10.0000 mg | INTRAMUSCULAR | Status: DC | PRN

## 2020-01-27 MED ORDER — HEPARIN (PORCINE) IN NACL 1000-0.9 UT/500ML-% IV SOLN
INTRAVENOUS | Status: DC | PRN
  Administered 2020-01-27: 500 mL

## 2020-01-27 MED ORDER — MIDAZOLAM HCL 2 MG/2ML IJ SOLN
INTRAMUSCULAR | Status: DC | PRN
  Administered 2020-01-27: 1 mg via INTRAVENOUS

## 2020-01-27 MED ORDER — FENTANYL CITRATE (PF) 100 MCG/2ML IJ SOLN
INTRAMUSCULAR | Status: AC
  Filled 2020-01-27: qty 2

## 2020-01-27 MED ORDER — ONDANSETRON HCL 4 MG/2ML IJ SOLN
4.0000 mg | Freq: Four times a day (QID) | INTRAMUSCULAR | Status: DC | PRN
  Administered 2020-01-27: 4 mg via INTRAVENOUS

## 2020-01-27 MED ORDER — HEPARIN (PORCINE) IN NACL 1000-0.9 UT/500ML-% IV SOLN
INTRAVENOUS | Status: AC
  Filled 2020-01-27: qty 1000

## 2020-01-27 SURGICAL SUPPLY — 9 items
CATH INFINITI 5FR ANG PIGTAIL (CATHETERS) ×3 IMPLANT
CATH INFINITI 5FR JL4 (CATHETERS) ×3 IMPLANT
CATH INFINITI JR4 5F (CATHETERS) ×3 IMPLANT
DEVICE CLOSURE MYNXGRIP 5F (Vascular Products) ×3 IMPLANT
KIT MANI 3VAL PERCEP (MISCELLANEOUS) ×3 IMPLANT
NEEDLE PERC 18GX7CM (NEEDLE) ×3 IMPLANT
PACK CARDIAC CATH (CUSTOM PROCEDURE TRAY) ×3 IMPLANT
SHEATH AVANTI 5FR X 11CM (SHEATH) ×3 IMPLANT
WIRE GUIDERIGHT .035X150 (WIRE) ×3 IMPLANT

## 2020-01-27 NOTE — H&P (Signed)
Established Patient Visit   Chief Complaint: Chief Complaint  Patient presents with  . Follow-up  stress test  Date of Service: 01/20/2020 Date of Birth: 07/27/1940 PCP: Velna Ochs, MD  History of Present Illness: Ricky Riley is a 79 y.o.male patient with a past medical history significant for valvular heart disease, type 2 diabetes, sleep apnea, intolerant to CPAP, and hypertension who presents to review stress test results. He was recently treated for pneumonia and has been gradually recovering since. He continues to experience occasional chest discomfort, palpitations, dizziness, and exertional shortness of breath. He remains fairly active, but symptoms have been somewhat limiting over the past few weeks. He denies lower extremity swelling, orthopnea, or PND. He denies syncopal/presyncopal episodes.   We discussed the results of the stress test at today's visit which revealed mildly reduced LV systolic function with an EF estimated at 46% with evidence of inferior hypokinesis and hypoperfusion with borderline reversibility. Echocardiogram on 12/25/19 revealed normal RV and LV systolic function with an EF estimated greater than 55% with mild MR and mild TR.   Past Medical and Surgical History  Past Medical History Past Medical History:  Diagnosis Date  . Allergic state  . BPH (benign prostatic hyperplasia)  . ED (erectile dysfunction)  . GERD (gastroesophageal reflux disease)  . Guaiac positive stools  followed by Dr. Ara Kussmaul  . Hypertension  . Prostatitis, chronic  . Sleep apnea   Past Surgical History He has a past surgical history that includes Penile implant; Bunion Correction (Left); TURP vaporization; and Inguinal hernia repair (Left, 04/22/2019).   Medications and Allergies  Current Medications  Current Outpatient Medications  Medication Sig Dispense Refill  . cetirizine (ZYRTEC) 10 MG tablet Take 10 mg by mouth once daily as needed.  . cholecalciferol (VITAMIN D3)  1,000 unit capsule Take 1,000 Units by mouth once daily  . finasteride (PROSCAR) 5 mg tablet Take 1 tablet (5 mg total) by mouth once daily. 30 tablet 11  . hydrochlorothiazide (HYDRODIURIL) 25 MG tablet 25 mg (Patient taking differently: Take 12.5 mg by mouth once daily Patient takes 12.5 mg daily. )  . losartan (COZAAR) 25 MG tablet TAKE 1 TABLET BY MOUTH EVERY DAY 90 tablet 1  . metFORMIN (GLUMETZA) 500 MG (MOD) ER tablet Take 500 mg by mouth daily with dinner.  . mirtazapine (REMERON) 15 MG tablet 1 tab by mouth at bedtime  . mometasone (NASONEX) 50 mcg/actuation nasal spray Place 2 sprays into both nostrils once daily as needed  . omeprazole (PRILOSEC) 20 MG DR capsule TAKE 2 CAPSULES BY MOUTH ONCE EVERY DAY TO CONTROL STOMACH ACID. TAKE 30 MINUTES BEFORE A MEAL  . sodium chloride (SALINE NOSE NASAL) by Nasal route  . terazosin (HYTRIN) 10 MG capsule Take 1 capsule (10 mg total) by mouth nightly. 30 capsule 11  . traZODone (DESYREL) 50 MG tablet Take 1 tablet (50 mg total) by mouth nightly 30 tablet 11   No current facility-administered medications for this visit.   Allergies: Aerobid [flunisolide], Atorvastatin, Cipro [ciprofloxacin], Ciprofibrate, Entex [pseudoephedrine tannate], Lisinopril, Penicillins, Rosuvastatin, Sulfa (sulfonamide antibiotics), and Sulfasalazine  Social and Family History  Social History reports that he quit smoking about 9 years ago. His smoking use included cigarettes. He has a 30.00 pack-year smoking history. He quit smokeless tobacco use about 9 years ago. He reports that he does not drink alcohol and does not use drugs.  Family History Family History  Problem Relation Age of Onset  . Diabetes type II Mother  .  Coronary Artery Disease (Blocked arteries around heart) Father  . Diabetes type II Father  . Myocardial Infarction (Heart attack) Father  . No Known Problems Sister  . Diabetes type II Brother  . Kidney failure Brother  . Heart disease Brother    Review of Systems   Review of Systems: The patient denies chest pain, shortness of breath, orthopnea, paroxysmal nocturnal dyspnea, pedal edema, palpitations, heart racing, fatigue, dizziness, lightheadedness, presyncope, syncope, leg pain, leg cramping. Review of 12 Systems is negative except as described in HPI.   Physical Examination   Vitals:BP 130/82  Pulse 64  Resp 16  Ht 190.5 cm (6\' 3" )  Wt 82.1 kg (181 lb)  SpO2 96%  BMI 22.62 kg/m  Ht:190.5 cm (6\' 3" ) Wt:82.1 kg (181 lb) BJY:NWGN surface area is 2.08 meters squared. Body mass index is 22.62 kg/m.  General: Well developed, well nourished. In no acute distress HEENT: Pupils equally reactive to light and accomodation  Neck: Supple without thyromegaly, or goiter. Carotid pulses 2+. No carotid bruits present.  Pulmonary: Clear to auscultation bilaterally; no wheezes, rales, rhonchi Cardiovascular: Regular rate and rhythm. No gallops, murmurs or rubs Gastrointestinal: Soft nontender, nondistended, with normal bowel sounds Extremities: No cyanosis, clubbing, or edema Peripheral Pulses: 2+ in upper extremities, 2+ in lower extremities  Neurology: Alert and oriented X3 Pysch: Good affect. Responds appropriately  Assessment and Plan   79 y.o. male with  1. Essential hypertension  -Continue HCTZ 12.5mg  daily  2. Valvular heart disease  -Mild MR and mild TR noted on most recent echo  3. Sleep apnea, unspecified type  -Intolerant to CPAP  4. Type 2 diabetes mellitus without complication, without long-term current use of insulin (CMS-HCC)  -Continue current medications with routine f/u with PCP  5. Abnormal cardiovascular stress test  -With ongoing chest discomfort, exertional dyspnea, and palpitations, will further assess with a LHC -We had a long discussion regarding benefits and risks of a left heart catheterization including risks of bleeding, infection, pseudoaneurysm, heart attack, stroke, and death. Patient aware of  the various outcomes of the procedure including recommendation for medical management, the need for coronary intervention, and potential referral for CABG. Patient voiced understanding and wishes to proceed with the procedure as discussed -BMP, CBC ordered     Orders Placed This Encounter  Procedures  . Complete Blood Count (CBC)  . Basic Metabolic Panel (BMP)   Return after cath.  Ricky Riley Ricky Erkkila MD  Pt seen and examined. No change from above.

## 2020-01-27 NOTE — Discharge Instructions (Signed)
Angiogram, Care After This sheet gives you information about how to care for yourself after your procedure. Your doctor may also give you more specific instructions. If you have problems or questions, contact your doctor. Follow these instructions at home: Insertion site care  Follow instructions from your doctor about how to take care of your long, thin tube (catheter) insertion area. Make sure you: ? Wash your hands with soap and water before you change your bandage (dressing). If you cannot use soap and water, use hand sanitizer. ? Change your bandage as told by your doctor. ? Leave stitches (sutures), skin glue, or skin tape (adhesive) strips in place. They may need to stay in place for 2 weeks or longer. If tape strips get loose and curl up, you may trim the loose edges. Do not remove tape strips completely unless your doctor says it is okay.  Do not take baths, swim, or use a hot tub until your doctor says it is okay.  You may shower 24-48 hours after the procedure or as told by your doctor. ? Gently wash the area with plain soap and water. ? Pat the area dry with a clean towel. ? Do not rub the area. This may cause bleeding.  Do not apply powder or lotion to the area. Keep the area clean and dry.  Check your insertion area every day for signs of infection. Check for: ? More redness, swelling, or pain. ? Fluid or blood. ? Warmth. ? Pus or a bad smell. Activity  Rest as told by your doctor, usually for 1-2 days.  Do not lift anything that is heavier than 10 lbs. (4.5 kg) or as told by your doctor.  Do not drive for 24 hours if you were given a medicine to help you relax (sedative).  Do not drive or use heavy machinery while taking prescription pain medicine. General instructions   Go back to your normal activities as told by your doctor, usually in about a week. Ask your doctor what activities are safe for you.  If the insertion area starts to bleed, lie flat and put  pressure on the area. If the bleeding does not stop, get help right away. This is an emergency.  Drink enough fluid to keep your pee (urine) clear or pale yellow.  Take over-the-counter and prescription medicines only as told by your doctor.  Keep all follow-up visits as told by your doctor. This is important. Contact a doctor if:  You have a fever.  You have chills.  You have more redness, swelling, or pain around your insertion area.  You have fluid or blood coming from your insertion area.  The insertion area feels warm to the touch.  You have pus or a bad smell coming from your insertion area.  You have more bruising around the insertion area.  Blood collects in the tissue around the insertion area (hematoma) that may be painful to the touch. Get help right away if:  You have a lot of pain in the insertion area.  The insertion area swells very fast.  The insertion area is bleeding, and the bleeding does not stop after holding steady pressure on the area.  The area near or just beyond the insertion area becomes pale, cool, tingly, or numb. These symptoms may be an emergency. Do not wait to see if the symptoms will go away. Get medical help right away. Call your local emergency services (911 in the [place] Do not drive yourself to the hospital.   Summary  After the procedure, it is common to have bruising and tenderness at the long, thin tube insertion area.  After the procedure, it is important to rest and drink plenty of fluids.  Do not take baths, swim, or use a hot tub until your doctor says it is okay to do so. You may shower 24-48 hours after the procedure or as told by your doctor.  If the insertion area starts to bleed, lie flat and put pressure on the area. If the bleeding does not stop, get help right away. This is an emergency. This information is not intended to replace advice given to you by your health care provider. Make sure you discuss any questions you have  with your health care provider. Document Revised: 05/10/2017 Document Reviewed: 05/22/2016 Elsevier Patient Education  2020 [employer]   Femoral Site Care This sheet gives you information about how to care for yourself after your procedure. Your health care provider may also give you more specific instructions. If you have problems or questions, contact your health care provider. What can I expect after the procedure? After the procedure, it is common to have:  Bruising that usually fades within 1-2 weeks.  Tenderness at the site. Follow these instructions at home: Wound care  Follow instructions from your health care provider about how to take care of your insertion site. Make sure you: ? Wash your hands with soap and water before you change your bandage (dressing). If soap and water are not available, use hand sanitizer. ? Change your dressing as told by your health care provider. ? Leave stitches (sutures), skin glue, or adhesive strips in place. These skin closures may need to stay in place for 2 weeks or longer. If adhesive strip edges start to loosen and curl up, you may trim the loose edges. Do not remove adhesive strips completely unless your health care provider tells you to do that.  Do not take baths, swim, or use a hot tub until your health care provider approves.  You may shower 24-48 hours after the procedure or as told by your health care provider. ? Gently wash the site with plain soap and water. ? Pat the area dry with a clean towel. ? Do not rub the site. This may cause bleeding.  Do not apply powder or lotion to the site. Keep the site clean and dry.  Check your femoral site every day for signs of infection. Check for: ? Redness, swelling, or pain. ? Fluid or blood. ? Warmth. ? Pus or a bad smell. Activity  For the first 2-3 days after your procedure, or as long as directed: ? Avoid climbing stairs as much as possible. ? Do not squat.  Do not lift  anything that is heavier than 10 lb (4.5 kg), or the limit that you are told, until your health care provider says that it is safe.  Rest as directed. ? Avoid sitting for a long time without moving. Get up to take short walks every 1-2 hours.  Do not drive for 24 hours if you were given a medicine to help you relax (sedative). General instructions  Take over-the-counter and prescription medicines only as told by your health care provider.  Keep all follow-up visits as told by your health care provider. This is important. Contact a health care provider if you have:  A fever or chills.  You have redness, swelling, or pain around your insertion site. Get help right away if:  The catheter   insertion area swells very fast.  You pass out.  You suddenly start to sweat or your skin gets clammy.  The catheter insertion area is bleeding, and the bleeding does not stop when you hold steady pressure on the area.  The area near or just beyond the catheter insertion site becomes pale, cool, tingly, or numb. These symptoms may represent a serious problem that is an emergency. Do not wait to see if the symptoms will go away. Get medical help right away. Call your local emergency services (911 in the [place] Do not drive yourself to the hospital. Summary  After the procedure, it is common to have bruising that usually fades within 1-2 weeks.  Check your femoral site every day for signs of infection.  Do not lift anything that is heavier than 10 lb (4.5 kg), or the limit that you are told, until your health care provider says that it is safe. This information is not intended to replace advice given to you by your health care provider. Make sure you discuss any questions you have with your health care provider. Document Revised: 06/10/2017 Document Reviewed: 06/10/2017 Elsevier Patient Education  2020 [employer]   Moderate Conscious Sedation, Adult, Care After These instructions provide you  with information about caring for yourself after your procedure. Your health care provider may also give you more specific instructions. Your treatment has been planned according to current medical practices, but problems sometimes occur. Call your health care provider if you have any problems or questions after your procedure. What can I expect after the procedure? After your procedure, it is common:  To feel sleepy for several hours.  To feel clumsy and have poor balance for several hours.  To have poor judgment for several hours.  To vomit if you eat too soon. Follow these instructions at home: For at least 24 hours after the procedure:   Do not: ? Participate in activities where you could fall or become injured. ? Drive. ? Use heavy machinery. ? Drink alcohol. ? Take sleeping pills or medicines that cause drowsiness. ? Make important decisions or sign legal documents. ? Take care of children on your own.  Rest. Eating and drinking  Follow the diet recommended by your health care provider.  If you vomit: ? Drink water, juice, or soup when you can drink without vomiting. ? Make sure you have little or no nausea before eating solid foods. General instructions  Have a responsible adult stay with you until you are awake and alert.  Take over-the-counter and prescription medicines only as told by your health care provider.  If you smoke, do not smoke without supervision.  Keep all follow-up visits as told by your health care provider. This is important. Contact a health care provider if:  You keep feeling nauseous or you keep vomiting.  You feel light-headed.  You develop a rash.  You have a fever. Get help right away if:  You have trouble breathing. This information is not intended to replace advice given to you by your health care provider. Make sure you discuss any questions you have with your health care provider. Document Revised: 05/10/2017 Document Reviewed:  09/17/2015 Elsevier Patient Education  2020 [employer] Inc.  

## 2020-03-31 ENCOUNTER — Ambulatory Visit (INDEPENDENT_AMBULATORY_CARE_PROVIDER_SITE_OTHER): Payer: Medicare Other | Admitting: Podiatry

## 2020-03-31 ENCOUNTER — Encounter: Payer: Self-pay | Admitting: Podiatry

## 2020-03-31 ENCOUNTER — Other Ambulatory Visit: Payer: Self-pay

## 2020-03-31 DIAGNOSIS — M79675 Pain in left toe(s): Secondary | ICD-10-CM | POA: Diagnosis not present

## 2020-03-31 DIAGNOSIS — E119 Type 2 diabetes mellitus without complications: Secondary | ICD-10-CM | POA: Diagnosis not present

## 2020-03-31 DIAGNOSIS — N1831 Chronic kidney disease, stage 3a: Secondary | ICD-10-CM

## 2020-03-31 DIAGNOSIS — N179 Acute kidney failure, unspecified: Secondary | ICD-10-CM

## 2020-03-31 DIAGNOSIS — M79674 Pain in right toe(s): Secondary | ICD-10-CM

## 2020-03-31 DIAGNOSIS — B351 Tinea unguium: Secondary | ICD-10-CM

## 2020-03-31 NOTE — Progress Notes (Signed)
This patient returns to my office for at risk foot care.  This patient requires this care by a professional since this patient will be at risk due to having diabetes with renal manifestations.  This patient is unable to cut nails himself since the patient cannot reach his nails.These nails are painful walking and wearing shoes.  This patient presents for at risk foot care today.  General Appearance  Alert, conversant and in no acute stress.  Vascular  Dorsalis pedis and posterior tibial  pulses are palpable  bilaterally.  Capillary return is within normal limits  bilaterally. Temperature is within normal limits  bilaterally.  Neurologic  Senn-Weinstein monofilament wire test within normal limits  bilaterally. Muscle power within normal limits bilaterally.  Nails Thick disfigured discolored nails with subungual debris  from hallux to fifth toes bilaterally. No evidence of bacterial infection or drainage bilaterally.  Orthopedic  No limitations of motion  feet .  No crepitus or effusions noted.  No bony pathology or digital deformities noted.  HAV  B/L.  Skin  normotropic skin with no porokeratosis noted bilaterally.  No signs of infections or ulcers noted.     Onychomycosis  Pain in right toes  Pain in left toes  Consent was obtained for treatment procedures.   Mechanical debridement of nails 1-5  bilaterally performed with a nail nipper.  Filed with dremel without incident.    Return office visit   10 weeks                   Told patient to return for periodic foot care and evaluation due to potential at risk complications.   Gardiner Barefoot DPM

## 2020-06-09 ENCOUNTER — Encounter: Payer: Self-pay | Admitting: Podiatry

## 2020-06-09 ENCOUNTER — Ambulatory Visit (INDEPENDENT_AMBULATORY_CARE_PROVIDER_SITE_OTHER): Payer: Medicare Other | Admitting: Podiatry

## 2020-06-09 ENCOUNTER — Other Ambulatory Visit: Payer: Self-pay

## 2020-06-09 DIAGNOSIS — N1831 Chronic kidney disease, stage 3a: Secondary | ICD-10-CM

## 2020-06-09 DIAGNOSIS — N179 Acute kidney failure, unspecified: Secondary | ICD-10-CM | POA: Diagnosis not present

## 2020-06-09 DIAGNOSIS — M79674 Pain in right toe(s): Secondary | ICD-10-CM

## 2020-06-09 DIAGNOSIS — B351 Tinea unguium: Secondary | ICD-10-CM

## 2020-06-09 DIAGNOSIS — M79675 Pain in left toe(s): Secondary | ICD-10-CM | POA: Diagnosis not present

## 2020-06-09 DIAGNOSIS — E119 Type 2 diabetes mellitus without complications: Secondary | ICD-10-CM | POA: Diagnosis not present

## 2020-06-09 NOTE — Progress Notes (Signed)
This patient returns to my office for at risk foot care.  This patient requires this care by a professional since this patient will be at risk due to having diabetes with renal manifestations.  This patient is unable to cut nails himself since the patient cannot reach his nails.These nails are painful walking and wearing shoes.  This patient presents for at risk foot care today.  General Appearance  Alert, conversant and in no acute stress.  Vascular  Dorsalis pedis and posterior tibial  pulses are palpable  bilaterally.  Capillary return is within normal limits  bilaterally. Temperature is within normal limits  bilaterally.  Neurologic  Senn-Weinstein monofilament wire test within normal limits  bilaterally. Muscle power within normal limits bilaterally.  Nails Thick disfigured discolored nails with subungual debris  from hallux to fifth toes bilaterally. No evidence of bacterial infection or drainage bilaterally.  Orthopedic  No limitations of motion  feet .  No crepitus or effusions noted.  No bony pathology or digital deformities noted.  HAV  B/L.  Skin  normotropic skin with no porokeratosis noted bilaterally.  No signs of infections or ulcers noted.     Onychomycosis  Pain in right toes  Pain in left toes  Consent was obtained for treatment procedures.   Mechanical debridement of nails 1-5  bilaterally performed with a nail nipper.  Filed with dremel without incident.    Return office visit   10 weeks                   Told patient to return for periodic foot care and evaluation due to potential at risk complications.   Javona Bergevin DPM  

## 2020-08-09 ENCOUNTER — Other Ambulatory Visit: Payer: Self-pay | Admitting: Internal Medicine

## 2020-08-09 DIAGNOSIS — R1032 Left lower quadrant pain: Secondary | ICD-10-CM

## 2020-08-11 ENCOUNTER — Emergency Department
Admission: EM | Admit: 2020-08-11 | Discharge: 2020-08-12 | Disposition: A | Discharge status: Home / Self Care | Payer: Medicare Other | Attending: Emergency Medicine | Admitting: Emergency Medicine

## 2020-08-11 ENCOUNTER — Emergency Department
Admission: RE | Admit: 2020-08-11 | Discharge: 2020-08-11 | Disposition: A | Discharge status: Home / Self Care | Payer: Medicare Other | Source: Ambulatory Visit | Attending: Internal Medicine | Admitting: Internal Medicine

## 2020-08-11 ENCOUNTER — Other Ambulatory Visit: Payer: Self-pay

## 2020-08-11 DIAGNOSIS — N1831 Chronic kidney disease, stage 3a: Secondary | ICD-10-CM | POA: Diagnosis not present

## 2020-08-11 DIAGNOSIS — R1084 Generalized abdominal pain: Secondary | ICD-10-CM | POA: Diagnosis present

## 2020-08-11 DIAGNOSIS — K8689 Other specified diseases of pancreas: Secondary | ICD-10-CM

## 2020-08-11 DIAGNOSIS — Z79899 Other long term (current) drug therapy: Secondary | ICD-10-CM | POA: Diagnosis not present

## 2020-08-11 DIAGNOSIS — R350 Frequency of micturition: Secondary | ICD-10-CM | POA: Diagnosis not present

## 2020-08-11 DIAGNOSIS — Z7984 Long term (current) use of oral hypoglycemic drugs: Secondary | ICD-10-CM | POA: Insufficient documentation

## 2020-08-11 DIAGNOSIS — R300 Dysuria: Secondary | ICD-10-CM | POA: Insufficient documentation

## 2020-08-11 DIAGNOSIS — K668 Other specified disorders of peritoneum: Secondary | ICD-10-CM | POA: Diagnosis not present

## 2020-08-11 DIAGNOSIS — E1122 Type 2 diabetes mellitus with diabetic chronic kidney disease: Secondary | ICD-10-CM | POA: Insufficient documentation

## 2020-08-11 DIAGNOSIS — R1032 Left lower quadrant pain: Secondary | ICD-10-CM

## 2020-08-11 DIAGNOSIS — Z87891 Personal history of nicotine dependence: Secondary | ICD-10-CM | POA: Insufficient documentation

## 2020-08-11 DIAGNOSIS — R160 Hepatomegaly, not elsewhere classified: Secondary | ICD-10-CM | POA: Insufficient documentation

## 2020-08-11 DIAGNOSIS — I129 Hypertensive chronic kidney disease with stage 1 through stage 4 chronic kidney disease, or unspecified chronic kidney disease: Secondary | ICD-10-CM | POA: Diagnosis not present

## 2020-08-11 DIAGNOSIS — R3911 Hesitancy of micturition: Secondary | ICD-10-CM | POA: Diagnosis not present

## 2020-08-11 LAB — URINALYSIS, COMPLETE (UACMP) WITH MICROSCOPIC
Bacteria, UA: NONE SEEN
Bilirubin Urine: NEGATIVE
Glucose, UA: NEGATIVE mg/dL
Hgb urine dipstick: NEGATIVE
Ketones, ur: NEGATIVE mg/dL
Leukocytes,Ua: NEGATIVE
Nitrite: NEGATIVE
Protein, ur: NEGATIVE mg/dL
Specific Gravity, Urine: 1.032 — ABNORMAL HIGH (ref 1.005–1.030)
Squamous Epithelial / HPF: NONE SEEN (ref 0–5)
pH: 5 (ref 5.0–8.0)

## 2020-08-11 LAB — COMPREHENSIVE METABOLIC PANEL
ALT: 62 U/L — ABNORMAL HIGH (ref 0–44)
AST: 62 U/L — ABNORMAL HIGH (ref 15–41)
Albumin: 4.1 g/dL (ref 3.5–5.0)
Alkaline Phosphatase: 195 U/L — ABNORMAL HIGH (ref 38–126)
Anion gap: 10 (ref 5–15)
BUN: 17 mg/dL (ref 8–23)
CO2: 22 mmol/L (ref 22–32)
Calcium: 10.1 mg/dL (ref 8.9–10.3)
Chloride: 99 mmol/L (ref 98–111)
Creatinine, Ser: 1.2 mg/dL (ref 0.61–1.24)
GFR, Estimated: >60 mL/min (ref >60)
Glucose, Bld: 114 mg/dL — ABNORMAL HIGH (ref 70–99)
Potassium: 3.8 mmol/L (ref 3.5–5.1)
Sodium: 131 mmol/L — ABNORMAL LOW (ref 135–145)
Total Bilirubin: 1.2 mg/dL (ref 0.3–1.2)
Total Protein: 7.8 g/dL (ref 6.5–8.1)

## 2020-08-11 LAB — CBC
HCT: 38.1 % — ABNORMAL LOW (ref 39.0–52.0)
Hemoglobin: 12.8 g/dL — ABNORMAL LOW (ref 13.0–17.0)
MCH: 26.8 pg (ref 26.0–34.0)
MCHC: 33.6 g/dL (ref 30.0–36.0)
MCV: 79.9 fL — ABNORMAL LOW (ref 80.0–100.0)
Platelets: 177 10*3/uL (ref 150–400)
RBC: 4.77 MIL/uL (ref 4.22–5.81)
RDW: 13.4 % (ref 11.5–15.5)
WBC: 8.1 10*3/uL (ref 4.0–10.5)
nRBC: 0 % (ref 0.0–0.2)

## 2020-08-11 LAB — LIPASE, BLOOD: Lipase: 27 U/L (ref 11–51)

## 2020-08-11 MED ORDER — OXYCODONE HCL 5 MG PO TABS
5.0000 mg | ORAL_TABLET | ORAL | Status: AC
  Administered 2020-08-11: 5 mg via ORAL
  Filled 2020-08-11: qty 1

## 2020-08-11 MED ORDER — IOHEXOL 300 MG/ML  SOLN
100.0000 mL | Freq: Once | INTRAMUSCULAR | Status: AC | PRN
  Administered 2020-08-11: 100 mL via INTRAVENOUS

## 2020-08-11 MED ORDER — ONDANSETRON 4 MG PO TBDP
8.0000 mg | ORAL_TABLET | Freq: Once | ORAL | Status: AC
  Administered 2020-08-11: 8 mg via ORAL
  Filled 2020-08-11: qty 2

## 2020-08-11 NOTE — ED Provider Notes (Signed)
-----------------------------------------   11:11 PM on 08/11/2020 -----------------------------------------  Blood pressure (!) 178/83, pulse (!) 59, temperature 97.6 F (36.4 C), temperature source Oral, resp. rate 18, height 6\' 3"  (1.905 m), weight 83.9 kg, SpO2 98 %.  Assuming care from Dr. Joni Fears.  In short, Ricky Riley is a 80 y.o. male with a chief complaint of Abdominal Pain and Dysuria .  Refer to the original H&P for additional details.  The current plan of care is to reassess for pain control following medications.  ----------------------------------------- 12:05 AM on 08/12/2020 -----------------------------------------  Patient's pain is improved following pain medication and Zofran, he is tolerating p.o. without difficulty.  He is appropriate for discharge home with follow-up in the cancer center tomorrow at 9 AM.  He was counseled to return to the ED for new worsening symptoms, patient agrees with plan.    Blake Divine, MD 08/12/20 0005

## 2020-08-11 NOTE — ED Provider Notes (Signed)
[facility] Emergency Department Provider Note  ____________________________________________  Time seen: Approximately 11:15 PM  I have reviewed the triage vital signs and the nursing notes.   HISTORY  Chief Complaint Abdominal Pain and Dysuria    HPI Ricky Riley is a 80 y.o. male with a history of diabetes, hypertension who comes ED complaining of generalized abdominal pain for the past 3 weeks, radiating to the back.  He also has urinary frequency and hesitancy.  No fevers or chills, no chest pain or shortness of breath.  No black or bloody stool.  Symptoms are waxing and waning without aggravating or alleviating factors.  Patient had an outpatient CT done this morning ordered by his PCP, has not had the results yet.  Reports decreased oral intake due to the pain.      Past Medical History:  Diagnosis Date  . Arthritis   . Diabetes mellitus without complication ([place])   . Dysrhythmia   . GERD (gastroesophageal reflux disease)   . Headache   . Hypertension   . Sleep apnea    CPAP      Patient Active Problem List   Diagnosis Date Noted  . Pain due to onychomycosis of toenails of both feet 03/31/2020  . Diabetes mellitus without complication ([place]) 94/49/6759  . Lobar pneumonia ([place]) 12/13/2019  . CAP (community acquired pneumonia) 12/12/2019  . Elevated troponin 12/12/2019  . GERD (gastroesophageal reflux disease) 12/12/2019  . Acute renal failure superimposed on stage 3a chronic kidney disease ([place]) 12/12/2019  . Type II diabetes mellitus with renal manifestations ([place]) 12/12/2019  . Left inguinal hernia 04/22/2019  . Closed fracture of body of sternum with delayed healing 04/30/2017  . SOB (shortness of breath) 03/22/2017  . Chest pain, musculoskeletal 02/05/2017  . Acute pain of right shoulder 01/11/2017  . Calf cramp 12/31/2016  . Right thyroid nodule 04/20/2016  . Primary osteoarthritis of right knee 03/30/2016  . Abnormal CT  scan, neck 03/21/2016  . Hypercalcemia 03/12/2016  . Chronic cough 01/23/2016  . Current chronic use of systemic steroids 01/23/2016  . Headache 01/26/2015  . Heart palpitations 03/17/2014  . Hypertension 03/17/2014  . Sleep apnea 03/17/2014  . Male erectile dysfunction 01/15/2013  . 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 Prostatitis, chronic 01/14/2012     Past Surgical History:  Procedure Laterality Date  . ARTERY BIOPSY Left 03/02/2015   Procedure: BIOPSY TEMPORAL ARTERY;  Surgeon: Clyde Canterbury, MD;  Location: [facility] ORS;  Service: ENT;  Laterality: Left;  . HERNIA REPAIR    . LEFT HEART CATH AND CORONARY ANGIOGRAPHY Left 01/27/2020   Procedure: LEFT HEART CATH AND CORONARY ANGIOGRAPHY;  Surgeon: Teodoro Spray, MD;  Location: [facility];  Service: Cardiovascular;  Laterality: Left;  . PENILE PROSTHESIS IMPLANT    . TRANSURETHRAL RESECTION OF PROSTATE    . XI ROBOTIC ASSISTED INGUINAL HERNIA REPAIR WITH MESH Left 04/22/2019   Procedure: XI ROBOTIC ASSISTED INGUINAL HERNIA REPAIR WITH MESH;  Surgeon: Herbert Pun, MD;  Location: [facility] ORS;  Service: General;  Laterality: Left;     Prior to Admission medications   Medication Sig Start Date End Date Taking? Authorizing Provider  acetaminophen (TYLENOL) 500 MG tablet Take 500 mg by mouth every 6 (six) hours as needed.    [provider]  cetirizine (ZYRTEC) 10 MG tablet Take 10 mg by mouth daily as needed for allergies.    [provider]  cholecalciferol (VITAMIN D) 1000  UNITS tablet Take 1,000 Units by mouth daily after supper.     [provider]  finasteride (PROSCAR) 5 MG tablet Take 5 mg by mouth daily.    [provider]  fluticasone (FLONASE) 50 MCG/ACT nasal spray Place into the nose. 02/08/20 02/07/21  [provider]  hydrochlorothiazide (HYDRODIURIL) 25 MG tablet Take 12.5 mg by mouth daily.    [provider]  losartan (COZAAR) 25 MG tablet Take 12.5 mg by mouth daily.  04/09/19   [provider]  metFORMIN (GLUCOPHAGE) 500 MG tablet Take 500 mg by mouth daily with breakfast.    [provider]  mirtazapine (REMERON) 15 MG tablet Take 15 mg by mouth at bedtime as needed (sleep).  08/21/10   [provider]  mometasone (NASONEX) 50 MCG/ACT nasal spray Place into the nose.    [provider]  omeprazole (PRILOSEC) 20 MG capsule Take 20 mg by mouth daily before breakfast.     [provider]  sodium chloride (OCEAN) 0.65 % SOLN nasal spray Place 1 spray into both nostrils as needed for congestion.    [provider]  terazosin (HYTRIN) 10 MG capsule Take 10 mg by mouth at bedtime.    [provider]  traZODone (DESYREL) 50 MG tablet Take 50 mg by mouth daily as needed for sleep.  01/17/20   [provider]     Allergies Ciprofloxacin, Penicillins, Sulfa antibiotics, and Sulfasalazine   Family History  Problem Relation Age of Onset  . Diabetes Mother   . Heart disease Mother   . Diabetes Father   . Heart disease Father   . Diabetes Sister   . Diabetes Brother   . Kidney failure Brother     Social History Social History   Tobacco Use  . Smoking status: Former Smoker    Packs/day: 1.00    Types: Cigarettes    Quit date: 06/05/2009    Years since quitting: 11.1  . Smokeless tobacco: Never Used  Vaping Use  . Vaping Use: Never used  Substance Use Topics  . Alcohol use: No  . Drug use: No    Review of Systems  Constitutional:   No fever or chills.  ENT:   No sore throat. No rhinorrhea. Cardiovascular:   No chest pain or syncope. Respiratory:   No dyspnea or cough. Gastrointestinal:   Positive as above for abdominal pain without vomiting and diarrhea.  Musculoskeletal:   Negative for focal pain or swelling All other systems reviewed and are negative except as documented above in ROS and  HPI.  ____________________________________________   PHYSICAL EXAM:  VITAL SIGNS: ED Triage Vitals  Enc Vitals Group     BP 08/11/20 1910 (!) 154/108     Pulse Rate 08/11/20 1910 86     Resp 08/11/20 1910 18     Temp 08/11/20 1910 97.6 F (36.4 C)     Temp Source 08/11/20 1910 Oral     SpO2 08/11/20 1910 98 %     Weight 08/11/20 1908 185 lb (83.9 kg)     Height 08/11/20 1908 6\' 3"  (1.905 m)     Head Circumference --      Peak Flow --      Pain Score 08/11/20 1908 10     Pain Loc --      Pain Edu? --      Excl. in [place]? --     Vital signs reviewed, nursing assessments reviewed.   Constitutional:  Alert and oriented. Non-toxic appearance. Eyes:   Conjunctivae are normal. EOMI. PERRL. ENT      Head:   Normocephalic and atraumatic.      Nose:   Wearing a mask.      Mouth/Throat:   Wearing a mask.      Neck:   No meningismus. Full ROM. Hematological/Lymphatic/Immunilogical:   No cervical lymphadenopathy. Cardiovascular:   RRR. Symmetric bilateral radial and DP pulses.  No murmurs. Cap refill less than 2 seconds. Respiratory:   Normal respiratory effort without tachypnea/retractions. Breath sounds are clear and equal bilaterally. No wheezes/rales/rhonchi. Gastrointestinal:   Soft and nontender. Non distended. There is no CVA tenderness.  No rebound, rigidity, or guarding.  Musculoskeletal:   Normal range of motion in all extremities. No joint effusions.  No lower extremity tenderness.  No edema. Neurologic:   Normal speech and language.  Motor grossly intact. No acute focal neurologic deficits are appreciated.  Skin:    Skin is warm, dry and intact. No rash noted.  No petechiae, purpura, or bullae.  ____________________________________________    LABS (pertinent positives/negatives) (all labs ordered are listed, but only abnormal results are displayed) Labs Reviewed  COMPREHENSIVE METABOLIC PANEL - Abnormal; Notable for the following components:      Result Value    Sodium 131 (*)    Glucose, Bld 114 (*)    AST 62 (*)    ALT 62 (*)    Alkaline Phosphatase 195 (*)    All other components within normal limits  CBC - Abnormal; Notable for the following components:   Hemoglobin 12.8 (*)    HCT 38.1 (*)    MCV 79.9 (*)    All other components within normal limits  URINALYSIS, COMPLETE (UACMP) WITH MICROSCOPIC - Abnormal; Notable for the following components:   Color, Urine YELLOW (*)    APPearance CLEAR (*)    Specific Gravity, Urine 1.032 (*)    All other components within normal limits  LIPASE, BLOOD  CA 19-9 (SERIAL)   ____________________________________________   EKG    ____________________________________________    RADIOLOGY  CT ABDOMEN PELVIS W CONTRAST  Result Date: 08/11/2020 CLINICAL DATA:  Bilateral flank pain EXAM: CT ABDOMEN AND PELVIS WITH CONTRAST TECHNIQUE: Multidetector CT imaging of the abdomen and pelvis was performed using the standard protocol following bolus administration of intravenous contrast. CONTRAST:  122mL OMNIPAQUE IOHEXOL 300 MG/ML  SOLN COMPARISON:  CT 12/12/2019 FINDINGS: Lower chest: Lung bases demonstrate no acute consolidation or effusion. Probable scarring at the right middle lobe and right base. Borderline cardiomegaly. No significant pericardial effusion. Hepatobiliary: Interim development of numerous hypodense hepatic masses concerning for metastatic disease. No calcified gallstone. No biliary dilatation. The gallbladder appears slightly thick walled. Pancreas: No inflammatory changes. Mild pancreatic ductal dilatation. Irregular hypodense mass at the tail of the pancreas measuring 5.8 by 3.1 by 3.4 cm. Spleen: Normal in size without focal abnormality. Adrenals/Urinary Tract: Adrenal glands are within normal limits. Kidneys show bilateral cysts. No hydronephrosis. The urinary bladder is unremarkable. Stomach/Bowel: The stomach is nonenlarged. No dilated small bowel. No acute bowel wall thickening. Negative  appendix Vascular/Lymphatic: Mild aortic atherosclerosis. No aneurysmal dilatation. Poorly defined soft tissue density at the gastrohepatic ligament measuring approximately 3 x 1.5 cm concerning for metastatic disease. Periportal lymph nodes measuring up to 18 cm. 13 mm left retroperitoneal lymph node at the level of the SMA. Reproductive: Prostate unremarkable. Penile prosthesis with left pelvic deflated reservoir. Other: No free air. Small amount of abdominopelvic ascites. Small mesenteric  nodules, for example 3 mm nodule anterior to the distal descending colon, series 2, image number 53. Multiple small soft tissue nodules in the right colic gutter. Multiple soft tissue nodules anterior to the left hepatic lobe measuring up to 17 mm, series 7, image number 17. Musculoskeletal: New lucent lesion with peripheral sclerosis in the posterior left ilium, series 2, image number 66 indeterminate for metastatic disease. IMPRESSION: 1. Interim development of numerous hypodense hepatic masses concerning for metastatic disease. 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 2. Gastrohepatic, periportal and retroperitoneal adenopathy also concerning for metastatic disease. 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 3. New lucent lesion with peripheral sclerosis in the posterior left ilium, indeterminate for metastatic disease. Aortic Atherosclerosis (ICD10-I70.0). Electronically Signed   By: Donavan Foil M.D.   On: 08/11/2020 21:50    ____________________________________________   PROCEDURES Procedures  ____________________________________________    CLINICAL IMPRESSION / ASSESSMENT AND PLAN / ED COURSE  Medications ordered in the ED: Medications  oxyCODONE (Oxy IR/ROXICODONE) immediate release tablet 5 mg (5 mg Oral Given 08/11/20 2244)  ondansetron  (ZOFRAN-ODT) disintegrating tablet 8 mg (8 mg Oral Given 08/11/20 2244)    Pertinent labs & imaging results that were available during my care of the patient were reviewed by me and considered in my medical decision making (see chart for details).  DAIMEN SHOVLIN was evaluated in Emergency Department on 08/11/2020 for the symptoms described in the history of present illness. He was evaluated in the context of the global COVID-19 pandemic, which necessitated consideration that the patient might be at risk for infection with the SARS-CoV-2 virus that causes COVID-19. Institutional protocols and algorithms that pertain to the evaluation of patients at risk for COVID-19 are in a state of rapid change based on information released by regulatory bodies including the CDC and federal and state organizations. These policies and algorithms were followed during the patient's care in the ED.   Patient presents with abdominal pain.  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  Patient informed of these findings.  Case discussed with Dr. Rogue Bussing.  Vital signs and labs are all unremarkable, patient is not in distress.  Stable for outpatient follow-up.  He can be seen at 9:00 AM tomorrow morning in the cancer center clinic.  We will give the patient oral oxycodone and Zofran, ginger ale.  He can be reassessed for pain control and discharged home.      ____________________________________________   FINAL CLINICAL IMPRESSION(S) / ED DIAGNOSES    Final diagnoses:  Pancreatic mass  Generalized abdominal pain     ED Discharge Orders    None      Portions of this note were generated with dragon dictation software. Dictation errors may occur despite best attempts at proofreading.   Carrie Mew, MD 08/11/20 [phone]

## 2020-08-11 NOTE — ED Notes (Signed)
Pt given warm blanket at this time 

## 2020-08-11 NOTE — Discharge Instructions (Signed)
Your CT scan shows a mass in the pancreas and many smaller masses in the liver and other locations in the abdomen.  Please go to the [facility] tomorrow morning by 9:00am to see Dr. Rogue Bussing.

## 2020-08-11 NOTE — ED Triage Notes (Addendum)
Pt states for the past 3 weeks he has had lower abd pain into penis and radiating into back. Pt states he is having frequency and trouble expelling urine. Pt states he had a CT scan done earlier today that his PCP ordered for his abd pain.

## 2020-08-12 ENCOUNTER — Inpatient Hospital Stay: Payer: Medicare Other | Attending: Internal Medicine | Admitting: Internal Medicine

## 2020-08-12 ENCOUNTER — Inpatient Hospital Stay: Payer: Medicare Other

## 2020-08-12 ENCOUNTER — Encounter: Payer: Self-pay | Admitting: *Deleted

## 2020-08-12 ENCOUNTER — Telehealth: Payer: Self-pay | Admitting: *Deleted

## 2020-08-12 ENCOUNTER — Encounter: Payer: Self-pay | Admitting: Internal Medicine

## 2020-08-12 DIAGNOSIS — K769 Liver disease, unspecified: Secondary | ICD-10-CM

## 2020-08-12 LAB — COMPREHENSIVE METABOLIC PANEL
ALT: 56 U/L — ABNORMAL HIGH (ref 0–44)
AST: 58 U/L — ABNORMAL HIGH (ref 15–41)
Albumin: 3.8 g/dL (ref 3.5–5.0)
Alkaline Phosphatase: 180 U/L — ABNORMAL HIGH (ref 38–126)
Anion gap: 12 (ref 5–15)
BUN: 23 mg/dL (ref 8–23)
CO2: 24 mmol/L (ref 22–32)
Calcium: 10.2 mg/dL (ref 8.9–10.3)
Chloride: 96 mmol/L — ABNORMAL LOW (ref 98–111)
Creatinine, Ser: 1.41 mg/dL — ABNORMAL HIGH (ref 0.61–1.24)
GFR, Estimated: 51 mL/min — ABNORMAL LOW (ref >60)
Glucose, Bld: 163 mg/dL — ABNORMAL HIGH (ref 70–99)
Potassium: 4 mmol/L (ref 3.5–5.1)
Sodium: 132 mmol/L — ABNORMAL LOW (ref 135–145)
Total Bilirubin: 1 mg/dL (ref 0.3–1.2)
Total Protein: 7.4 g/dL (ref 6.5–8.1)

## 2020-08-12 LAB — PROTIME-INR
INR: 1.3 — ABNORMAL HIGH (ref 0.8–1.2)
Prothrombin Time: 15.3 seconds — ABNORMAL HIGH (ref 11.4–15.2)

## 2020-08-12 LAB — APTT: aPTT: 34 seconds (ref 24–36)

## 2020-08-12 MED ORDER — TRAMADOL HCL 50 MG PO TABS: 50.0000 mg | ORAL_TABLET | Freq: Four times a day (QID) | ORAL | 0 refills | Status: DC | PRN

## 2020-08-12 NOTE — Assessment & Plan Note (Addendum)
#  Multiple liver lesions-CT scan August 11, 2020 [ER]-multiple hypodense hepatic lesions; also 5.8 cm tail of pancreas mass; positive for gastrohepatic periportal retroperitoneal adenopathy; multiple peritoneal deposits.  Left posterior iliac sclerotic lesion  #Discussed with patient regarding the serious concerns for malignancy especially pancreatic.  Recommend a liver biopsy ASAP.  Discussed with Dr. Pascal Lux radiology.  #LFT elevation -mild to moderate normal bilirubin-likely secondary to metastatic disease no concerns for any biliary obstruction.  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  Recommend tramadol new prescription given.  #Mild dizzyness/mild intermittent hypercalcemia- STOP vit D/ STOP HCTZ.   # Genetics evaluation: Discuss-therapeutic/family implications.  Thank you  for allowing me to participate in the care of your pleasant patient. Please do not hesitate to contact me with questions or concerns in the interim.  # I reviewed the blood work- with the patient in detail; also reviewed the imaging independently [as summarized above]; and with the patient in detail.    # DISPOSITION: update/ son * daughter ph number # LABS TODAY- CMP; CA-19-9; CEA; PT /PTT # LIVER Biopsy [place] ASAP # follow up 3-4 days after the biopsy- MD; no labs- GB

## 2020-08-12 NOTE — Progress Notes (Signed)
[place] NOTE  Patient Care Team: Baxter Hire, MD as PCP - General (Internal Medicine)  CHIEF COMPLAINTS/PURPOSE OF CONSULTATION: liver lesions #  Oncology History   No history exists.   # 08/11/2020- CT scan- ER- multiple liver lesions; pancreatic tail lesion and also abdominal/retroperitoneal adenopathy concerning for malignancy.  # COVID- Jan 2021/ Pneumonia July 2021. [facility]]; CAD [Dr.Fath; [place]]  HISTORY OF PRESENTING ILLNESS:  Ricky Riley 80 y.o.  male with no prior history of liver disease/or pancreatic disease has been referred to [place] for further evaluation recommendations for multiple liver lesions.  Patient states her noted pain in his right upper quadrant/left upper quadrant generalized for the last 3 to 4 weeks.  As this was unremitting/progressive-.  CT scan in the emergency room on 3/3-that showed multiple liver lesions; pancreatic lesion and also abdominal/retroperitoneal adenopathy concerning for malignancy.  Of note patient had imaging in the hospital in June 2021-negative for any abnormal findings at the time.  Patient intermittent constipation.  Denies any blood in stools or black or stools.  Mild weight loss.  Appetite is fair.  No swelling in the legs.  In general patient is fairly active.  He rode himself alone to the appointment.  However he has borrowed his wife's rolling walker as he felt weak.  Review of Systems  Constitutional: Positive for malaise/fatigue and weight loss. Negative for chills, diaphoresis and fever.  HENT: Negative for nosebleeds and sore throat.   Eyes: Negative for double vision.  Respiratory: Negative for cough, hemoptysis, sputum production, shortness of breath and wheezing.   Cardiovascular: Negative for chest pain, palpitations, orthopnea and leg swelling.  Gastrointestinal: Positive for abdominal pain and nausea. Negative for blood in stool, constipation, diarrhea, heartburn,  melena and vomiting.  Genitourinary: Negative for dysuria, frequency and urgency.  Musculoskeletal: Positive for back pain. Negative for joint pain.  Skin: Negative.  Negative for itching and rash.  Neurological: Positive for dizziness. Negative for tingling, focal weakness, weakness and headaches.  Endo/Heme/Allergies: Does not bruise/bleed easily.  Psychiatric/Behavioral: Negative for depression. The patient is not nervous/anxious and does not have insomnia.      MEDICAL HISTORY:  Past Medical History:  Diagnosis Date  . Arthritis   . Cancer associated pain   . Diabetes mellitus without complication ([place])   . Dysrhythmia   . GERD (gastroesophageal reflux disease)   . Headache   . Hypertension   . Sleep apnea    CPAP     SURGICAL HISTORY: Past Surgical History:  Procedure Laterality Date  . ARTERY BIOPSY Left 03/02/2015   Procedure: BIOPSY TEMPORAL ARTERY;  Surgeon: Clyde Canterbury, MD;  Location: [facility];  Service: ENT;  Laterality: Left;  . HERNIA REPAIR    . LEFT HEART CATH AND CORONARY ANGIOGRAPHY Left 01/27/2020   Procedure: LEFT HEART CATH AND CORONARY ANGIOGRAPHY;  Surgeon: Teodoro Spray, MD;  Location: [facility];  Service: Cardiovascular;  Laterality: Left;  . PENILE PROSTHESIS IMPLANT    . TRANSURETHRAL RESECTION OF PROSTATE    . XI ROBOTIC ASSISTED INGUINAL HERNIA REPAIR WITH MESH Left 04/22/2019   Procedure: XI ROBOTIC ASSISTED INGUINAL HERNIA REPAIR WITH MESH;  Surgeon: Herbert Pun, MD;  Location: [facility];  Service: General;  Laterality: Left;    SOCIAL HISTORY: Social History   Socioeconomic History  . Marital status: Married    Spouse name: Not on file  . Number of children: Not on file  . Years of  education: Not on file  . Highest education level: Not on file  Occupational History  . Not on file  Tobacco Use  . Smoking status: Former Smoker    Packs/day: 1.00    Types: Cigarettes    Quit date: 06/05/2009    Years since quitting:  11.1  . Smokeless tobacco: Never Used  Vaping Use  . Vaping Use: Never used  Substance and Sexual Activity  . Alcohol use: No  . Drug use: No  . Sexual activity: Not on file  Other Topics Concern  . Not on file  Social History Narrative   Lives in [place] with wife.  He is his wife's caregiver. Daughter-[place]; son- [place]. Used to work in Social worker jobs. Quit smoking in 2012. No alcohol.     Social Determinants of Health   Financial Resource Strain: Not on file  Food Insecurity: Not on file  Transportation Needs: Not on file  Physical Activity: Not on file  Stress: Not on file  Social Connections: Not on file  Intimate Partner Violence: Not on file    FAMILY HISTORY: Family History  Problem Relation Age of Onset  . Diabetes Mother   . Heart disease Mother   . Diabetes Father   . Heart disease Father   . Cancer Father 54  . Diabetes Sister   . Diabetes Brother   . Kidney failure Brother     ALLERGIES:  is allergic to atorvastatin, flunisolide, lisinopril, rosuvastatin, ciprofloxacin, penicillins, sulfa antibiotics, and sulfasalazine.  MEDICATIONS:  Current Outpatient Medications  Medication Sig Dispense Refill  . acetaminophen (TYLENOL) 500 MG tablet Take 500 mg by mouth every 6 (six) hours as needed.    Marland Kitchen aspirin 81 MG EC tablet Take 1 tablet by mouth daily.    . cetirizine (ZYRTEC) 10 MG tablet Take 10 mg by mouth daily as needed for allergies.    . finasteride (PROSCAR) 5 MG tablet Take 5 mg by mouth daily.    . fluticasone (FLONASE) 50 MCG/ACT nasal spray Place into the nose.    . losartan (COZAAR) 25 MG tablet Take 12.5 mg by mouth daily.     . metFORMIN (GLUCOPHAGE) 500 MG tablet Take 500 mg by mouth daily with breakfast.    . mirtazapine (REMERON) 15 MG tablet Take 15 mg by mouth at bedtime as needed (sleep).     Marland Kitchen omeprazole (PRILOSEC) 20 MG capsule Take 20 mg by mouth daily before breakfast.     . terazosin (HYTRIN) 10 MG capsule Take 10 mg by mouth  at bedtime.    . traMADol (ULTRAM) 50 MG tablet Take 1 tablet (50 mg total) by mouth every 6 (six) hours as needed. 45 tablet 0  . traZODone (DESYREL) 50 MG tablet Take 50 mg by mouth daily as needed for sleep.      No current facility-administered medications for this visit.   Facility-Administered Medications Ordered in Other Visits  Medication Dose Route Frequency Provider Last Rate Last Admin  . 0.9 %  sodium chloride infusion  250 mL Intravenous PRN Teodoro Spray, MD      . 0.9% sodium chloride infusion  1 mL/kg/hr Intravenous Continuous Teodoro Spray, MD      . sodium chloride flush (NS) 0.9 % injection 3 mL  3 mL Intravenous Q12H Teodoro Spray, MD      . sodium chloride flush (NS) 0.9 % injection 3 mL  3 mL Intravenous PRN Teodoro Spray, MD          .  PHYSICAL EXAMINATION: ECOG PERFORMANCE STATUS: 1 - Symptomatic but completely ambulatory  Vitals:   08/12/20 0838  BP: 113/73  Pulse: 76  Resp: 16  Temp: (!) 95 F (35 C)  SpO2: 100%   Filed Weights   08/12/20 0838  Weight: 186 lb (84.4 kg)    Physical Exam Constitutional:      Comments: Ambulating with wife's walker. Alone.   HENT:     Head: Normocephalic and atraumatic.     Mouth/Throat:     Mouth: Oropharynx is clear and moist.     Pharynx: No oropharyngeal exudate.  Eyes:     Pupils: Pupils are equal, round, and reactive to light.  Cardiovascular:     Rate and Rhythm: Normal rate and regular rhythm.  Pulmonary:     Effort: No respiratory distress.     Breath sounds: No wheezing.  Abdominal:     General: Bowel sounds are normal. There is no distension.     Palpations: Abdomen is soft. There is no mass.     Tenderness: There is no abdominal tenderness. There is no guarding or rebound.  Musculoskeletal:        General: No tenderness or edema. Normal range of motion.     Cervical back: Normal range of motion and neck supple.  Skin:    General: Skin is warm.  Neurological:     Mental Status: He  is alert and oriented to person, place, and time.  Psychiatric:        Mood and Affect: Affect normal.      LABORATORY DATA:  I have reviewed the data as listed Lab Results  Component Value Date   WBC 8.1 08/11/2020   HGB 12.8 (L) 08/11/2020   HCT 38.1 (L) 08/11/2020   MCV 79.9 (L) 08/11/2020   PLT 177 08/11/2020   Recent Labs    12/12/19 0836 12/13/19 0416 12/14/19 0641 08/11/20 1913 08/12/20 0945  NA 131* 135 134* 131* 132*  K 3.6 4.1 4.2 3.8 4.0  CL 99 105 105 99 96*  CO2 22 23 23 22 24   GLUCOSE 143* 109* 104* 114* 163*  BUN 24* 21 20 17 23   CREATININE 1.59* 1.37* 1.22 1.20 1.41*  CALCIUM 9.8 9.2 9.5 10.1 10.2  GFRNONAA 41* 49* 57* >60 51*  GFRAA 48* 57* >60  --   --   PROT 7.6  --   --  7.8 7.4  ALBUMIN 3.6  --   --  4.1 3.8  AST 17  --   --  62* 58*  ALT 11  --   --  62* 56*  ALKPHOS 51  --   --  195* 180*  BILITOT 0.7  --   --  1.2 1.0    RADIOGRAPHIC STUDIES: I have personally reviewed the radiological images as listed and agreed with the findings in the report. CT ABDOMEN PELVIS W CONTRAST  Result Date: 08/11/2020 CLINICAL DATA:  Bilateral flank pain EXAM: CT ABDOMEN AND PELVIS WITH CONTRAST TECHNIQUE: Multidetector CT imaging of the abdomen and pelvis was performed using the standard protocol following bolus administration of intravenous contrast. CONTRAST:  139mL OMNIPAQUE IOHEXOL 300 MG/ML  SOLN COMPARISON:  CT 12/12/2019 FINDINGS: Lower chest: Lung bases demonstrate no acute consolidation or effusion. Probable scarring at the right middle lobe and right base. Borderline cardiomegaly. No significant pericardial effusion. Hepatobiliary: Interim development of numerous hypodense hepatic masses concerning for metastatic disease. No calcified gallstone. No biliary dilatation. The gallbladder appears slightly thick walled. Pancreas:  No inflammatory changes. Mild pancreatic ductal dilatation. Irregular hypodense mass at the tail of the pancreas measuring 5.8 by 3.1  by 3.4 cm. Spleen: Normal in size without focal abnormality. Adrenals/Urinary Tract: Adrenal glands are within normal limits. Kidneys show bilateral cysts. No hydronephrosis. The urinary bladder is unremarkable. Stomach/Bowel: The stomach is nonenlarged. No dilated small bowel. No acute bowel wall thickening. Negative appendix Vascular/Lymphatic: Mild aortic atherosclerosis. No aneurysmal dilatation. Poorly defined soft tissue density at the gastrohepatic ligament measuring approximately 3 x 1.5 cm concerning for metastatic disease. Periportal lymph nodes measuring up to 18 cm. 13 mm left retroperitoneal lymph node at the level of the SMA. Reproductive: Prostate unremarkable. Penile prosthesis with left pelvic deflated reservoir. Other: No free air. Small amount of abdominopelvic ascites. Small mesenteric nodules, for example 3 mm nodule anterior to the distal descending colon, series 2, image number 53. Multiple small soft tissue nodules in the right colic gutter. Multiple soft tissue nodules anterior to the left hepatic lobe measuring up to 17 mm, series 7, image number 17. Musculoskeletal: New lucent lesion with peripheral sclerosis in the posterior left ilium, series 2, image number 39 indeterminate for metastatic disease. IMPRESSION: 1. Interim development of numerous hypodense hepatic masses concerning for metastatic disease. 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 2. Gastrohepatic, periportal and retroperitoneal adenopathy also concerning for metastatic disease. 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 3. New lucent lesion with peripheral sclerosis in the posterior left ilium, indeterminate for metastatic disease. Aortic Atherosclerosis (ICD10-I70.0). Electronically Signed   By: Donavan Foil M.D.   On: 08/11/2020 21:50    ASSESSMENT & PLAN:    Lesion of liver #Multiple liver lesions-CT scan August 11, 2020 [ER]-multiple hypodense hepatic lesions; also 5.8 cm tail of pancreas mass; positive for gastrohepatic periportal retroperitoneal adenopathy; multiple peritoneal deposits.  Left posterior iliac sclerotic lesion  #Discussed with patient regarding the serious concerns for malignancy especially pancreatic.  Recommend a liver biopsy ASAP.  Discussed with Dr. Pascal Lux radiology.  #LFT elevation -mild to moderate normal bilirubin-likely secondary to metastatic disease no concerns for any biliary obstruction.  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  Recommend tramadol new prescription given.  #Mild dizzyness/mild intermittent hypercalcemia- STOP vit D/ STOP HCTZ.   # Genetics evaluation: Discuss-therapeutic/family implications.  Thank you  for allowing me to participate in the care of your pleasant patient. Please do not hesitate to contact me with questions or concerns in the interim.  # I reviewed the blood work- with the patient in detail; also reviewed the imaging independently [as summarized above]; and with the patient in detail.    # DISPOSITION: update/ son * daughter ph number # LABS TODAY- CMP; CA-19-9; CEA; PT /PTT # LIVER Biopsy [place] ASAP # follow up 3-4 days after the biopsy- MD; no labs- GB   All questions were answered. The patient knows to call the clinic with any problems, questions or concerns.    Cammie Sickle, MD 08/12/2020 11:27 AM

## 2020-08-12 NOTE — Telephone Encounter (Signed)
Ricky Riley Key: [id number] - PA Case ID: [id number] - Rx #: [id number]  Pa submitted for tramadol

## 2020-08-12 NOTE — Progress Notes (Signed)
Did mention urinary sxs. States he has trouble urinating at times. Has lower abdominal pressure and has a hard time getting started when needing to urinated.

## 2020-08-12 NOTE — Telephone Encounter (Signed)
Approved today  Your PA request has been approved

## 2020-08-12 NOTE — Telephone Encounter (Signed)
Patient contacted. Informed patient that his tramadol prescription was approved by his insurance and he will be able to pick up the prescription. He also inquired about the liver biopsy that is being planned. I explained that we do not have an apt date for this at this time. However, we will call him as soon as we know this apt date. Patient was reminded that on the day of his biopsy, he will need to have a driver for the procedure. He will also needs to be NPO after midnight (or 6-8 hours prior to the apt). He gave verbal understanding of the plan of care.

## 2020-08-13 LAB — CANCER ANTIGEN 19-9: CA 19-9: 22987 U/mL — ABNORMAL HIGH (ref 0–35)

## 2020-08-13 LAB — CEA: CEA: 12.4 ng/mL — ABNORMAL HIGH (ref 0.0–4.7)

## 2020-08-14 ENCOUNTER — Emergency Department
Admission: EM | Admit: 2020-08-14 | Discharge: 2020-08-14 | Disposition: A | Discharge status: Home / Self Care | Payer: Medicare Other | Attending: Emergency Medicine | Admitting: Emergency Medicine

## 2020-08-14 ENCOUNTER — Other Ambulatory Visit: Payer: Self-pay

## 2020-08-14 DIAGNOSIS — R112 Nausea with vomiting, unspecified: Secondary | ICD-10-CM | POA: Insufficient documentation

## 2020-08-14 DIAGNOSIS — R1013 Epigastric pain: Secondary | ICD-10-CM | POA: Diagnosis present

## 2020-08-14 DIAGNOSIS — E1122 Type 2 diabetes mellitus with diabetic chronic kidney disease: Secondary | ICD-10-CM | POA: Diagnosis not present

## 2020-08-14 DIAGNOSIS — K8689 Other specified diseases of pancreas: Secondary | ICD-10-CM

## 2020-08-14 DIAGNOSIS — Z7982 Long term (current) use of aspirin: Secondary | ICD-10-CM | POA: Diagnosis not present

## 2020-08-14 DIAGNOSIS — K869 Disease of pancreas, unspecified: Secondary | ICD-10-CM | POA: Diagnosis not present

## 2020-08-14 DIAGNOSIS — Z7984 Long term (current) use of oral hypoglycemic drugs: Secondary | ICD-10-CM | POA: Insufficient documentation

## 2020-08-14 DIAGNOSIS — Z87891 Personal history of nicotine dependence: Secondary | ICD-10-CM | POA: Diagnosis not present

## 2020-08-14 DIAGNOSIS — N1831 Chronic kidney disease, stage 3a: Secondary | ICD-10-CM | POA: Diagnosis not present

## 2020-08-14 DIAGNOSIS — K219 Gastro-esophageal reflux disease without esophagitis: Secondary | ICD-10-CM | POA: Insufficient documentation

## 2020-08-14 DIAGNOSIS — I129 Hypertensive chronic kidney disease with stage 1 through stage 4 chronic kidney disease, or unspecified chronic kidney disease: Secondary | ICD-10-CM | POA: Insufficient documentation

## 2020-08-14 LAB — CBC
HCT: 36.3 % — ABNORMAL LOW (ref 39.0–52.0)
Hemoglobin: 11.8 g/dL — ABNORMAL LOW (ref 13.0–17.0)
MCH: 26 pg (ref 26.0–34.0)
MCHC: 32.5 g/dL (ref 30.0–36.0)
MCV: 80 fL (ref 80.0–100.0)
Platelets: 180 10*3/uL (ref 150–400)
RBC: 4.54 MIL/uL (ref 4.22–5.81)
RDW: 13.2 % (ref 11.5–15.5)
WBC: 7.9 10*3/uL (ref 4.0–10.5)
nRBC: 0 % (ref 0.0–0.2)

## 2020-08-14 LAB — COMPREHENSIVE METABOLIC PANEL
ALT: 45 U/L — ABNORMAL HIGH (ref 0–44)
AST: 52 U/L — ABNORMAL HIGH (ref 15–41)
Albumin: 3.6 g/dL (ref 3.5–5.0)
Alkaline Phosphatase: 194 U/L — ABNORMAL HIGH (ref 38–126)
Anion gap: 10 (ref 5–15)
BUN: 19 mg/dL (ref 8–23)
CO2: 22 mmol/L (ref 22–32)
Calcium: 10 mg/dL (ref 8.9–10.3)
Chloride: 102 mmol/L (ref 98–111)
Creatinine, Ser: 1.36 mg/dL — ABNORMAL HIGH (ref 0.61–1.24)
GFR, Estimated: 54 mL/min — ABNORMAL LOW (ref >60)
Glucose, Bld: 125 mg/dL — ABNORMAL HIGH (ref 70–99)
Potassium: 3.9 mmol/L (ref 3.5–5.1)
Sodium: 134 mmol/L — ABNORMAL LOW (ref 135–145)
Total Bilirubin: 1.1 mg/dL (ref 0.3–1.2)
Total Protein: 7.2 g/dL (ref 6.5–8.1)

## 2020-08-14 LAB — URINALYSIS, COMPLETE (UACMP) WITH MICROSCOPIC
Bacteria, UA: NONE SEEN
Bilirubin Urine: NEGATIVE
Glucose, UA: NEGATIVE mg/dL
Hgb urine dipstick: NEGATIVE
Ketones, ur: NEGATIVE mg/dL
Leukocytes,Ua: NEGATIVE
Nitrite: NEGATIVE
Protein, ur: NEGATIVE mg/dL
Specific Gravity, Urine: 1.015 (ref 1.005–1.030)
pH: 5 (ref 5.0–8.0)

## 2020-08-14 LAB — LIPASE, BLOOD: Lipase: 26 U/L (ref 11–51)

## 2020-08-14 MED ORDER — HYDROCODONE-ACETAMINOPHEN 5-325 MG PO TABS
2.0000 | ORAL_TABLET | Freq: Once | ORAL | Status: AC
  Administered 2020-08-14: 2 via ORAL
  Filled 2020-08-14: qty 2

## 2020-08-14 MED ORDER — HYDROCODONE-ACETAMINOPHEN 5-325 MG PO TABS: 1.0000 | ORAL_TABLET | Freq: Four times a day (QID) | ORAL | 0 refills | Status: DC | PRN

## 2020-08-14 MED ORDER — ONDANSETRON 4 MG PO TBDP: 4.0000 mg | ORAL_TABLET | Freq: Three times a day (TID) | ORAL | 0 refills | Status: AC | PRN

## 2020-08-14 NOTE — ED Provider Notes (Signed)
[facility] Emergency Department Provider Note   ____________________________________________   Event Date/Time   First MD Initiated Contact with Patient 08/14/20 [phone]     (approximate)  I have reviewed the triage vital signs and the nursing notes.   HISTORY  Chief Complaint Abdominal Pain    HPI Ricky Riley is a 80 y.o. 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  Patient states this pain is 10/10, nonradiating, and associated with nausea and occasional vomiting of nonbloody emesis.  Patient states that he has been taking tramadol with no relief in his pain and even states that this worsens his pain when he takes it.  Patient denies using any other medications for pain control at this time.  Patient denies any exacerbating or relieving factors for this pain.  Patient currently denies any vision changes, tinnitus, difficulty speaking, facial droop, sore throat, chest pain, shortness of breath, diarrhea, dysuria, or weakness/numbness/paresthesias in any extremity         Past Medical History:  Diagnosis Date  . Arthritis   . Cancer associated pain   . Diabetes mellitus without complication ([place])   . Dysrhythmia   . GERD (gastroesophageal reflux disease)   . Headache   . Hypertension   . Sleep apnea    CPAP     Patient Active Problem List   Diagnosis Date Noted  . Lesion of liver 08/12/2020  . Pain due to onychomycosis of toenails of both feet 03/31/2020  . Diabetes mellitus without complication ([place]) 26/83/4196  . Lobar pneumonia ([place]) 12/13/2019  . CAP (community acquired pneumonia) 12/12/2019  . Elevated troponin 12/12/2019  . GERD (gastroesophageal reflux disease) 12/12/2019  . Acute renal failure superimposed on stage 3a chronic kidney disease ([place]) 12/12/2019  . Type II diabetes mellitus with renal  manifestations ([place]) 12/12/2019  . Left inguinal hernia 04/22/2019  . Closed fracture of body of sternum with delayed healing 04/30/2017  . SOB (shortness of breath) 03/22/2017  . Chest pain, musculoskeletal 02/05/2017  . Acute pain of right shoulder 01/11/2017  . Calf cramp 12/31/2016  . Right thyroid nodule 04/20/2016  . Primary osteoarthritis of right knee 03/30/2016  . Abnormal CT scan, neck 03/21/2016  . Hypercalcemia 03/12/2016  . Chronic cough 01/23/2016  . Current chronic use of systemic steroids 01/23/2016  . Headache 01/26/2015  . Heart palpitations 03/17/2014  . Hypertension 03/17/2014  . Sleep apnea 03/17/2014  . Male erectile dysfunction 01/15/2013  . 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 ARTERY BIOPSY Left 03/02/2015   Procedure: BIOPSY TEMPORAL ARTERY;  Surgeon: Clyde Canterbury, MD;  Location: [facility] ORS;  Service: ENT;  Laterality: Left;  . HERNIA REPAIR    . LEFT HEART CATH AND CORONARY ANGIOGRAPHY Left 01/27/2020   Procedure: LEFT HEART CATH AND CORONARY ANGIOGRAPHY;  Surgeon: Teodoro Spray, MD;  Location: [facility];  Service: Cardiovascular;  Laterality: Left;  . PENILE PROSTHESIS IMPLANT    . TRANSURETHRAL RESECTION OF PROSTATE    . XI ROBOTIC ASSISTED INGUINAL HERNIA REPAIR WITH MESH Left 04/22/2019   Procedure: XI ROBOTIC ASSISTED INGUINAL HERNIA REPAIR WITH MESH;  Surgeon: Herbert Pun, MD;  Location: [facility] ORS;  Service: General;  Laterality: Left;    Prior to Admission medications   Medication Sig  Start Date End Date Taking? Authorizing Provider  HYDROcodone-acetaminophen (NORCO) 5-325 MG tablet Take 1-2 tablets by mouth every 6 (six) hours as needed for up to 7 days for moderate pain or severe pain. 08/14/20 08/21/20 Yes Bernie Fobes, Vista Lawman, MD  ondansetron (ZOFRAN ODT) 4 MG disintegrating tablet Take 1  tablet (4 mg total) by mouth every 8 (eight) hours as needed for nausea or vomiting. 08/14/20  Yes Naaman Plummer, MD  acetaminophen (TYLENOL) 500 MG tablet Take 500 mg by mouth every 6 (six) hours as needed.    [provider]  aspirin 81 MG EC tablet Take 1 tablet by mouth daily. 02/03/19   [provider]  cetirizine (ZYRTEC) 10 MG tablet Take 10 mg by mouth daily as needed for allergies.    [provider]  finasteride (PROSCAR) 5 MG tablet Take 5 mg by mouth daily.    [provider]  fluticasone (FLONASE) 50 MCG/ACT nasal spray Place into the nose. 02/08/20 02/07/21  [provider]  losartan (COZAAR) 25 MG tablet Take 12.5 mg by mouth daily.  04/09/19   [provider]  metFORMIN (GLUCOPHAGE) 500 MG tablet Take 500 mg by mouth daily with breakfast.    [provider]  mirtazapine (REMERON) 15 MG tablet Take 15 mg by mouth at bedtime as needed (sleep).  08/21/10   [provider]  omeprazole (PRILOSEC) 20 MG capsule Take 20 mg by mouth daily before breakfast.     [provider]  terazosin (HYTRIN) 10 MG capsule Take 10 mg by mouth at bedtime.    [provider]  traMADol (ULTRAM) 50 MG tablet Take 1 tablet (50 mg total) by mouth every 6 (six) hours as needed. 08/12/20   Cammie Sickle, MD  traZODone (DESYREL) 50 MG tablet Take 50 mg by mouth daily as needed for sleep.  01/17/20   [provider]    Allergies Atorvastatin, Flunisolide, Lisinopril, Rosuvastatin, Ciprofloxacin, Penicillins, Sulfa antibiotics, and Sulfasalazine  Family History  Problem Relation Age of Onset  . Diabetes Mother   . Heart disease Mother   . Diabetes Father   . Heart disease Father   . Cancer Father 70  . Diabetes Sister   . Diabetes Brother   . Kidney failure Brother     Social History Social History   Tobacco Use  . Smoking status: Former Smoker    Packs/day: 1.00    Types: Cigarettes    Quit date:  06/05/2009    Years since quitting: 11.2  . Smokeless tobacco: Never Used  Vaping Use  . Vaping Use: Never used  Substance Use Topics  . Alcohol use: No  . Drug use: No    Review of Systems Constitutional: No fever/chills Eyes: No visual changes. ENT: No sore throat. Cardiovascular: Denies chest pain. Respiratory: Denies shortness of breath. Gastrointestinal: Endorses abdominal pain, nausea, and vomiting no diarrhea. Genitourinary: Negative for dysuria. Musculoskeletal: Negative for acute arthralgias Skin: Negative for rash. Neurological: Negative for headaches, weakness/numbness/paresthesias in any extremity Psychiatric: Negative for suicidal ideation/homicidal ideation   ____________________________________________   PHYSICAL EXAM:  VITAL SIGNS: ED Triage Vitals  Enc Vitals Group     BP 08/14/20 0906 125/73     Pulse Rate 08/14/20 0906 74     Resp 08/14/20 0906 16     Temp 08/14/20 0906 98.6 F (37 C)     Temp Source 08/14/20 0906 Oral     SpO2 08/14/20 0906 100 %     Weight  08/14/20 0904 185 lb (83.9 kg)     Height 08/14/20 0904 6\' 3"  (1.905 m)     Head Circumference --      Peak Flow --      Pain Score 08/14/20 0903 10     Pain Loc --      Pain Edu? --      Excl. in [place]? --    Constitutional: Alert and oriented. Well appearing and in no acute distress. Eyes: Conjunctivae are normal. PERRL. Head: Atraumatic. Nose: No congestion/rhinnorhea. Mouth/Throat: Mucous membranes are moist. Neck: No stridor Cardiovascular: Grossly normal heart sounds.  Good peripheral circulation. Respiratory: Normal respiratory effort.  No retractions. Gastrointestinal: Soft and midepigastric tenderness palpation. No distention. Musculoskeletal: No obvious deformities Neurologic:  Normal speech and language. No gross focal neurologic deficits are appreciated. Skin:  Skin is warm and dry. No rash noted. Psychiatric: Mood and affect are normal. Speech and behavior are  normal.  ____________________________________________   LABS (all labs ordered are listed, but only abnormal results are displayed)  Labs Reviewed  COMPREHENSIVE METABOLIC PANEL - Abnormal; Notable for the following components:      Result Value   Sodium 134 (*)    Glucose, Bld 125 (*)    Creatinine, Ser 1.36 (*)    AST 52 (*)    ALT 45 (*)    Alkaline Phosphatase 194 (*)    GFR, Estimated 54 (*)    All other components within normal limits  CBC - Abnormal; Notable for the following components:   Hemoglobin 11.8 (*)    HCT 36.3 (*)    All other components within normal limits  URINALYSIS, COMPLETE (UACMP) WITH MICROSCOPIC - Abnormal; Notable for the following components:   Color, Urine YELLOW (*)    APPearance CLEAR (*)    All other components within normal limits  LIPASE, BLOOD    PROCEDURES  Procedure(s) performed (including Critical Care):  .1-3 Lead EKG Interpretation Performed by: Naaman Plummer, MD Authorized by: Naaman Plummer, MD     Interpretation: normal     ECG rate:  56   ECG rate assessment: normal     Rhythm: sinus rhythm     Ectopy: none     Conduction: normal       ____________________________________________   INITIAL IMPRESSION / ASSESSMENT AND PLAN / ED COURSE  As part of my medical decision making, I reviewed the following data within the [place] notes reviewed and incorporated, Labs reviewed, Old chart reviewed, and Notes from prior ED visits reviewed and incorporated        Patient presents for abdominal pain.  Differential diagnosis includes appendicitis, abdominal aortic aneurysm, surgical biliary disease, pancreatitis, SBO, mesenteric ischemia, serious intra-abdominal bacterial illness, genital torsion. Doubt atypical ACS. Based on history, physical exam, radiologic/laboratory evaluation, there is no red flag results or symptomatology requiring emergent intervention or need for admission at this time.   Patient's pain likely coming from pancreatic mass seen on previous imaging Pt tolerating PO. Disposition: Patient will be discharged with strict return precautions and follow up with primary MD within 12-24 hours for further evaluation. Patient understands that this still may have an early presentation of an emergent medical condition such as appendicitis that will require a recheck.      ____________________________________________   FINAL CLINICAL IMPRESSION(S) / ED DIAGNOSES  Final diagnoses:  Epigastric pain  Pancreatic mass     ED Discharge Orders         Ordered  HYDROcodone-acetaminophen (NORCO) 5-325 MG tablet  Every 6 hours PRN        08/14/20 1240    ondansetron (ZOFRAN ODT) 4 MG disintegrating tablet  Every 8 hours PRN        08/14/20 1240           Note:  This document was prepared using Dragon voice recognition software and may include unintentional dictation errors.   Naaman Plummer, MD 08/14/20 1346

## 2020-08-14 NOTE — ED Notes (Signed)
Patient is resting comfortably. 

## 2020-08-14 NOTE — ED Triage Notes (Signed)
Pt comes into the ED via EMS from home with c/o generalized abd pain that radiates into the back , recently seen for the same and referred to the [facility] due to finding a mass  63HR [id number] 176/85 98%RA

## 2020-08-15 ENCOUNTER — Telehealth: Payer: Self-pay | Admitting: *Deleted

## 2020-08-15 ENCOUNTER — Telehealth: Payer: Self-pay | Admitting: Internal Medicine

## 2020-08-15 ENCOUNTER — Other Ambulatory Visit: Payer: Self-pay | Admitting: Hospice and Palliative Medicine

## 2020-08-15 ENCOUNTER — Other Ambulatory Visit: Payer: Self-pay | Admitting: Radiology

## 2020-08-15 ENCOUNTER — Other Ambulatory Visit: Payer: Self-pay | Admitting: *Deleted

## 2020-08-15 MED ORDER — SENNOSIDES-DOCUSATE SODIUM 8.6-50 MG PO TABS: 1.0000 | ORAL_TABLET | Freq: Two times a day (BID) | ORAL | 3 refills | Status: AC

## 2020-08-15 MED ORDER — HYDROCODONE-ACETAMINOPHEN 5-325 MG PO TABS: 1.0000 | ORAL_TABLET | ORAL | 0 refills | Status: DC | PRN

## 2020-08-15 NOTE — Progress Notes (Signed)
Patient on schedule for Liver Biopsy 08/16/2020, called and spoke with patient on phone with pre procedure instructions given. Made aware to be here @ 0830, NPO after MN prior to procedure, and driver post procedure/recovery/discharge. To hold am dose of Metformin. Stated understanding.

## 2020-08-15 NOTE — Telephone Encounter (Signed)
On 3/05-I had a long discussion with patient's son Rod, regarding the serious concerns for malignancy based on imaging.  Awaiting biopsy-discuss further options.  Son understands the seriousness of the situation.  Will monitor closely.

## 2020-08-15 NOTE — Telephone Encounter (Signed)
U/s liver biopsy scheduled tomorrow. Patient needs to arrive at 10 [facility] medical [facility].  I called patient to give him his biopsy date info. pt seen in er yesterday with abd pain.  His prescription was changed to norco, but the ER doc only gave him 6 or 7 tablets. He has been trying to only take 1 every 4-6 hours, but the one tablet is not helping. I asked him to take 2 tablets. He is nervous that if he does this now, he will only have 1 tablet let for this evening. Can we send a new script for 2 tablets of norco? also he reports abdominal pain when he urinates;  the ER r/o a UTI yesterday. He reports no bm x 3-4 days. He has doculax in the med cabinet, so I encouraged him to take this at this time. constipation protocol reviewed with patient: pt understands that he can also try miralax, milk of mag; prunes/prune juice.  Pt informed that I will call him back after lunch to see how his pain is doing after taking 2 tablets. He wasn't able to come into clinic for evaluation b/c he didn't have a ride for today. His wife doesn't drive.

## 2020-08-15 NOTE — Telephone Encounter (Signed)
Spoke with patient 38 - patient states that his pain improved after taking the 2 tablets. He also took 2 dulcolax this morning. Patient instructed to try Senna 1 capsule daily initially. He may use the Senna twice daily as needed for BM. Patient had not had a bm since the am conversation. Prescription for Senna sent to the patient's pharmacy per his request. Patient will go to the drive through pharmacy to pick up his norco this afternoon. Also, patient was instructed that it was ok to take his pain medications prior to his apt for the biopsy. I personally confirmed this with Forestine Chute, RN in IR department.  Biopsy moved up to arrival time of 8:30 am. Patient was aware of the new apt time change. He was given follow-up apts with Dr. Rogue Bussing.

## 2020-08-15 NOTE — Progress Notes (Signed)
[facility] in [facility].

## 2020-08-15 NOTE — Telephone Encounter (Signed)
Patient called stating that the pharmacy in [facility] does not have his pain medicine and that a new [facility] needs [facility] [place]. He is also asking if he can take his wife's medicine that she is not going to take, but it is a 10/325mg  and he was ordered 5/325 mg I advised that he not take someone elses medicine especially since the dose is different

## 2020-08-15 NOTE — Telephone Encounter (Signed)
I sent Rx to [facility]

## 2020-08-15 NOTE — H&P (Addendum)
Chief Complaint: Patient was seen in consultation today for an image-guided liver lesion biopsy.   Referring Physician(s): Cammie Sickle  Supervising Physician: Mir, Sharen Heck  Patient Status: [facility] - Out-pt  History of Present Illness: Ricky Riley is a 80 y.o. male with a medical history significant for DM, HTN, CKD stage 3, and a newly diagnosed pancreatic mass. He presented to the ED 08/11/20 with generalized abdominal pain x 3 weeks. The pain radiated to his back and he also complained of urinary frequency and hesitancy. Imaging was obtained.   CT abdomen/pelvis 08/11/20 IMPRESSION: 1. Interim development of numerous hypodense hepatic masses concerning for metastatic disease. 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 2. Gastrohepatic, periportal and retroperitoneal adenopathy also concerning for metastatic disease. 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 3. New lucent lesion with peripheral sclerosis in the posterior left ilium, indeterminate for metastatic disease.  Interventional Radiology has been asked to evaluate this patient for an image-guided liver lesion biopsy. Imaging has been reviewed and procedure approved by Dr. Pascal Lux.   Past Medical History:  Diagnosis Date   Arthritis    Cancer associated pain    Diabetes mellitus without complication ([place])    Dysrhythmia    GERD (gastroesophageal reflux disease)    Headache    Hypertension    Sleep apnea    CPAP     Past Surgical History:  Procedure Laterality Date   ARTERY BIOPSY Left 03/02/2015   Procedure: BIOPSY TEMPORAL ARTERY;  Surgeon: Clyde Canterbury, MD;  Location: [facility] ORS;  Service: ENT;  Laterality: Left;   HERNIA REPAIR     LEFT HEART CATH AND CORONARY ANGIOGRAPHY Left 01/27/2020   Procedure: LEFT HEART CATH AND CORONARY  ANGIOGRAPHY;  Surgeon: Teodoro Spray, MD;  Location: [facility];  Service: Cardiovascular;  Laterality: Left;   PENILE PROSTHESIS IMPLANT     TRANSURETHRAL RESECTION OF PROSTATE     XI ROBOTIC ASSISTED INGUINAL HERNIA REPAIR WITH MESH Left 04/22/2019   Procedure: XI ROBOTIC ASSISTED INGUINAL HERNIA REPAIR WITH MESH;  Surgeon: Herbert Pun, MD;  Location: [facility] ORS;  Service: General;  Laterality: Left;    Allergies: Atorvastatin, Flunisolide, Lisinopril, Rosuvastatin, Ciprofloxacin, Penicillins, Sulfa antibiotics, and Sulfasalazine  Medications: Prior to Admission medications   Medication Sig Start Date End Date Taking? Authorizing Provider  acetaminophen (TYLENOL) 500 MG tablet Take 500 mg by mouth every 6 (six) hours as needed.    [provider]  aspirin 81 MG EC tablet Take 1 tablet by mouth daily. 02/03/19   [provider]  cetirizine (ZYRTEC) 10 MG tablet Take 10 mg by mouth daily as needed for allergies.    [provider]  finasteride (PROSCAR) 5 MG tablet Take 5 mg by mouth daily.    [provider]  fluticasone (FLONASE) 50 MCG/ACT nasal spray Place into the nose. 02/08/20 02/07/21  [provider]  HYDROcodone-acetaminophen (NORCO) 5-325 MG tablet Take 1-2 tablets by mouth every 4 (four) hours as needed for moderate pain or severe pain. 08/15/20   Borders, Kirt Boys, NP  losartan (COZAAR) 25 MG tablet Take 12.5 mg by mouth daily.  04/09/19   [provider]  metFORMIN (GLUCOPHAGE) 500 MG tablet Take 500 mg by mouth daily with breakfast.    [provider]  mirtazapine (REMERON) 15 MG tablet Take 15 mg by mouth at bedtime as needed (sleep).  08/21/10   [provider]  omeprazole (PRILOSEC) 20 MG capsule Take 20 mg by mouth daily before breakfast.     [provider]  ondansetron (ZOFRAN ODT) 4 MG disintegrating tablet Take 1 tablet (4 mg total) by mouth every 8 (eight) hours as needed  for nausea or vomiting. 08/14/20   Naaman Plummer, MD  senna-docusate (SENOKOT-S) 8.6-50 MG tablet Take 1 tablet by mouth 2 (two) times daily. 08/15/20   Cammie Sickle, MD  terazosin (HYTRIN) 10 MG capsule Take 10 mg by mouth at bedtime.    [provider]  traZODone (DESYREL) 50 MG tablet Take 50 mg by mouth daily as needed for sleep.  01/17/20   [provider]     Family History  Problem Relation Age of Onset   Diabetes Mother    Heart disease Mother    Diabetes Father    Heart disease Father    Cancer Father 1   Diabetes Sister    Diabetes Brother    Kidney failure Brother     Social History   Socioeconomic History   Marital status: Married    Spouse name: Not on file   Number of children: Not on file   Years of education: Not on file   Highest education level: Not on file  Occupational History   Not on file  Tobacco Use   Smoking status: Former Smoker    Packs/day: 1.00    Types: Cigarettes    Quit date: 06/05/2009    Years since quitting: 11.2   Smokeless tobacco: Never Used  Vaping Use   Vaping Use: Never used  Substance and Sexual Activity   Alcohol use: No   Drug use: No   Sexual activity: Not on file  Other Topics Concern   Not on file  Social History Narrative   Lives in [place] with wife.  He is his wife's caregiver. Daughter-[place]; son- [place]. Used to work in Social worker jobs. Quit smoking in 2012. No alcohol.     Social Determinants of Health   Financial Resource Strain: Not on file  Food Insecurity: Not on file  Transportation Needs: Not on file  Physical Activity: Not on file  Stress: Not on file  Social Connections: Not on file    Review of Systems: A 12 point ROS discussed and pertinent positives are indicated in the HPI above.  All other systems are negative.  Review of Systems  Constitutional: Positive for appetite change and fatigue.  Respiratory: Positive for shortness of breath.  Negative for cough.   Cardiovascular: Negative for chest pain and leg swelling.  Gastrointestinal: Positive for abdominal pain. Negative for diarrhea, nausea and vomiting.  Genitourinary: Positive for difficulty urinating.  Musculoskeletal: Positive for back pain.  Neurological: Positive for headaches. Negative for dizziness.    Vital Signs: BP 119/71    Pulse 65    Temp 98 F (36.7 C) (Oral)    Resp 15    Ht 6\' 3"  (1.905 m)    Wt 185 lb (83.9 kg)    SpO2 97%    BMI 23.12 kg/m   Physical Exam Constitutional:      General: He is not in acute distress. HENT:     Mouth/Throat:     Mouth: Mucous membranes are dry.     Pharynx: Oropharynx is clear.  Cardiovascular:     Rate and Rhythm: Normal rate and regular rhythm.     Pulses: Normal pulses.     Heart  sounds: Normal heart sounds.  Pulmonary:     Effort: Pulmonary effort is normal.     Breath sounds: Normal breath sounds.  Abdominal:     General: Bowel sounds are normal.     Palpations: Abdomen is soft.     Tenderness: There is abdominal tenderness.     Comments: Left upper abdominal pain that radiates to his back  Musculoskeletal:        General: Normal range of motion.  Skin:    General: Skin is warm and dry.     Coloration: Skin is not jaundiced.  Neurological:     Mental Status: He is alert and oriented to person, place, and time.     Imaging: CT ABDOMEN PELVIS W CONTRAST  Result Date: 08/11/2020 CLINICAL DATA:  Bilateral flank pain EXAM: CT ABDOMEN AND PELVIS WITH CONTRAST TECHNIQUE: Multidetector CT imaging of the abdomen and pelvis was performed using the standard protocol following bolus administration of intravenous contrast. CONTRAST:  152mL OMNIPAQUE IOHEXOL 300 MG/ML  SOLN COMPARISON:  CT 12/12/2019 FINDINGS: Lower chest: Lung bases demonstrate no acute consolidation or effusion. Probable scarring at the right middle lobe and right base. Borderline cardiomegaly. No significant pericardial effusion. Hepatobiliary:  Interim development of numerous hypodense hepatic masses concerning for metastatic disease. No calcified gallstone. No biliary dilatation. The gallbladder appears slightly thick walled. Pancreas: No inflammatory changes. Mild pancreatic ductal dilatation. Irregular hypodense mass at the tail of the pancreas measuring 5.8 by 3.1 by 3.4 cm. Spleen: Normal in size without focal abnormality. Adrenals/Urinary Tract: Adrenal glands are within normal limits. Kidneys show bilateral cysts. No hydronephrosis. The urinary bladder is unremarkable. Stomach/Bowel: The stomach is nonenlarged. No dilated small bowel. No acute bowel wall thickening. Negative appendix Vascular/Lymphatic: Mild aortic atherosclerosis. No aneurysmal dilatation. Poorly defined soft tissue density at the gastrohepatic ligament measuring approximately 3 x 1.5 cm concerning for metastatic disease. Periportal lymph nodes measuring up to 18 cm. 13 mm left retroperitoneal lymph node at the level of the SMA. Reproductive: Prostate unremarkable. Penile prosthesis with left pelvic deflated reservoir. Other: No free air. Small amount of abdominopelvic ascites. Small mesenteric nodules, for example 3 mm nodule anterior to the distal descending colon, series 2, image number 53. Multiple small soft tissue nodules in the right colic gutter. Multiple soft tissue nodules anterior to the left hepatic lobe measuring up to 17 mm, series 7, image number 17. Musculoskeletal: New lucent lesion with peripheral sclerosis in the posterior left ilium, series 2, image number 69 indeterminate for metastatic disease. IMPRESSION: 1. Interim development of numerous hypodense hepatic masses concerning for metastatic disease. 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 2. Gastrohepatic, periportal and retroperitoneal adenopathy also concerning for metastatic disease. Small amount of abdominopelvic ascites with  multiple intraperitoneal nodules, concerning for peritoneal metastatic disease. 3. New lucent lesion with peripheral sclerosis in the posterior left ilium, indeterminate for metastatic disease. Aortic Atherosclerosis (ICD10-I70.0). Electronically Signed   By: Donavan Foil M.D.   On: 08/11/2020 21:50    Labs:  CBC: Recent Labs    12/14/19 0641 08/11/20 1913 08/14/20 0908 08/16/20 0847  WBC 5.4 8.1 7.9 8.8  HGB 10.1* 12.8* 11.8* 12.0*  HCT 30.6* 38.1* 36.3* 37.2*  PLT 184 177 180 183    COAGS: Recent Labs    08/12/20 0945 08/16/20 0847  INR 1.3* 1.2  APTT 34  --     BMP: Recent Labs    12/12/19 0836  12/13/19 0416 12/14/19 0641 08/11/20 1913 08/12/20 0945 08/14/20 0908  NA 131* 135 134* 131* 132* 134*  K 3.6 4.1 4.2 3.8 4.0 3.9  CL 99 105 105 99 96* 102  CO2 22 23 23 22 24 22   GLUCOSE 143* 109* 104* 114* 163* 125*  BUN 24* 21 20 17 23 19   CALCIUM 9.8 9.2 9.5 10.1 10.2 10.0  CREATININE 1.59* 1.37* 1.22 1.20 1.41* 1.36*  GFRNONAA 41* 49* 57* >60 51* 54*  GFRAA 48* 57* >60  --   --   --     LIVER FUNCTION TESTS: Recent Labs    12/12/19 0836 08/11/20 1913 08/12/20 0945 08/14/20 0908  BILITOT 0.7 1.2 1.0 1.1  AST 17 62* 58* 52*  ALT 11 62* 56* 45*  ALKPHOS 51 195* 180* 194*  PROT 7.6 7.8 7.4 7.2  ALBUMIN 3.6 4.1 3.8 3.6    TUMOR MARKERS: No results for input(s): AFPTM, CEA, CA199, CHROMGRNA in the last 8760 hours.  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  Risks and benefits of this procedure were discussed with the patient and/or patient's family including, but not limited to bleeding, infection, damage to adjacent structures or low yield requiring additional tests.  All of the questions were answered and there is agreement to proceed. He has been NPO. Labs and  vitals have been reviewed. He does not take any blood thinning medications.   Consent signed and in chart.  Thank you for this interesting consult.  I greatly enjoyed meeting GEORGIE HAQUE and look forward to participating in their care.  A copy of this report was sent to the requesting provider on this date.  Electronically Signed: Soyla Dryer, AGACNP-BC [id number] 08/16/2020, 9:22 AM   I spent a total of  30 Minutes   in face to face in clinical consultation, greater than 50% of which was counseling/coordinating care for image-guided liver lesion biopsy

## 2020-08-16 ENCOUNTER — Ambulatory Visit
Admission: RE | Admit: 2020-08-16 | Discharge: 2020-08-16 | Disposition: A | Discharge status: Home / Self Care | Payer: Medicare Other | Source: Ambulatory Visit | Attending: Internal Medicine | Admitting: Internal Medicine

## 2020-08-16 ENCOUNTER — Other Ambulatory Visit: Payer: Self-pay

## 2020-08-16 ENCOUNTER — Ambulatory Visit: Payer: Medicare Other

## 2020-08-16 DIAGNOSIS — C787 Secondary malignant neoplasm of liver and intrahepatic bile duct: Secondary | ICD-10-CM | POA: Insufficient documentation

## 2020-08-16 DIAGNOSIS — Z87891 Personal history of nicotine dependence: Secondary | ICD-10-CM | POA: Insufficient documentation

## 2020-08-16 DIAGNOSIS — K769 Liver disease, unspecified: Secondary | ICD-10-CM

## 2020-08-16 DIAGNOSIS — G893 Neoplasm related pain (acute) (chronic): Secondary | ICD-10-CM | POA: Diagnosis not present

## 2020-08-16 DIAGNOSIS — C801 Malignant (primary) neoplasm, unspecified: Secondary | ICD-10-CM | POA: Insufficient documentation

## 2020-08-16 LAB — CBC
HCT: 37.2 % — ABNORMAL LOW (ref 39.0–52.0)
Hemoglobin: 12 g/dL — ABNORMAL LOW (ref 13.0–17.0)
MCH: 26.3 pg (ref 26.0–34.0)
MCHC: 32.3 g/dL (ref 30.0–36.0)
MCV: 81.6 fL (ref 80.0–100.0)
Platelets: 183 10*3/uL (ref 150–400)
RBC: 4.56 MIL/uL (ref 4.22–5.81)
RDW: 13.2 % (ref 11.5–15.5)
WBC: 8.8 10*3/uL (ref 4.0–10.5)
nRBC: 0 % (ref 0.0–0.2)

## 2020-08-16 LAB — GLUCOSE, CAPILLARY
Glucose-Capillary: 118 mg/dL — ABNORMAL HIGH (ref 70–99)
Glucose-Capillary: 105 mg/dL — ABNORMAL HIGH (ref 70–99)

## 2020-08-16 LAB — PROTIME-INR
INR: 1.2 (ref 0.8–1.2)
Prothrombin Time: 15.1 seconds (ref 11.4–15.2)

## 2020-08-16 MED ORDER — FENTANYL CITRATE (PF) 100 MCG/2ML IJ SOLN
INTRAMUSCULAR | Status: AC
  Filled 2020-08-16: qty 2

## 2020-08-16 MED ORDER — SODIUM CHLORIDE 0.9 % IV SOLN: INTRAVENOUS | Status: DC

## 2020-08-16 MED ORDER — FENTANYL CITRATE (PF) 100 MCG/2ML IJ SOLN
INTRAMUSCULAR | Status: AC | PRN
  Administered 2020-08-16: 50 ug via INTRAVENOUS

## 2020-08-16 MED ORDER — MIDAZOLAM HCL 5 MG/5ML IJ SOLN
INTRAMUSCULAR | Status: AC
  Filled 2020-08-16: qty 5

## 2020-08-16 MED ORDER — MIDAZOLAM HCL 5 MG/5ML IJ SOLN
INTRAMUSCULAR | Status: AC | PRN
  Administered 2020-08-16: 1 mg via INTRAVENOUS

## 2020-08-16 NOTE — Procedures (Signed)
Interventional Radiology Procedure Note  Procedure: Liver lesion biopsy  Indication: Multiple liver lesions  Findings: Please refer to procedural dictation for full description.  Complications: None  EBL: < 10 mL  Miachel Roux, MD [phone]

## 2020-08-17 ENCOUNTER — Other Ambulatory Visit: Payer: Self-pay

## 2020-08-17 ENCOUNTER — Encounter: Payer: Self-pay | Admitting: *Deleted

## 2020-08-17 ENCOUNTER — Inpatient Hospital Stay
Admission: EM | Admit: 2020-08-17 | Discharge: 2020-08-20 | DRG: 948 | Disposition: A | Discharge status: Home Health | Payer: Medicare Other | Attending: Internal Medicine | Admitting: Internal Medicine

## 2020-08-17 DIAGNOSIS — E1122 Type 2 diabetes mellitus with diabetic chronic kidney disease: Secondary | ICD-10-CM | POA: Diagnosis present

## 2020-08-17 DIAGNOSIS — Z882 Allergy status to sulfonamides status: Secondary | ICD-10-CM

## 2020-08-17 DIAGNOSIS — N179 Acute kidney failure, unspecified: Secondary | ICD-10-CM | POA: Diagnosis present

## 2020-08-17 DIAGNOSIS — K8689 Other specified diseases of pancreas: Secondary | ICD-10-CM | POA: Diagnosis not present

## 2020-08-17 DIAGNOSIS — Z888 Allergy status to other drugs, medicaments and biological substances status: Secondary | ICD-10-CM

## 2020-08-17 DIAGNOSIS — C259 Malignant neoplasm of pancreas, unspecified: Secondary | ICD-10-CM | POA: Diagnosis present

## 2020-08-17 DIAGNOSIS — N401 Enlarged prostate with lower urinary tract symptoms: Secondary | ICD-10-CM | POA: Diagnosis present

## 2020-08-17 DIAGNOSIS — T402X5A Adverse effect of other opioids, initial encounter: Secondary | ICD-10-CM

## 2020-08-17 DIAGNOSIS — C252 Malignant neoplasm of tail of pancreas: Secondary | ICD-10-CM

## 2020-08-17 DIAGNOSIS — K5903 Drug induced constipation: Secondary | ICD-10-CM | POA: Diagnosis not present

## 2020-08-17 DIAGNOSIS — R7401 Elevation of levels of liver transaminase levels: Secondary | ICD-10-CM

## 2020-08-17 DIAGNOSIS — Z20822 Contact with and (suspected) exposure to covid-19: Secondary | ICD-10-CM | POA: Diagnosis present

## 2020-08-17 DIAGNOSIS — E871 Hypo-osmolality and hyponatremia: Secondary | ICD-10-CM | POA: Diagnosis not present

## 2020-08-17 DIAGNOSIS — G893 Neoplasm related pain (acute) (chronic): Principal | ICD-10-CM | POA: Diagnosis present

## 2020-08-17 DIAGNOSIS — N1831 Chronic kidney disease, stage 3a: Secondary | ICD-10-CM | POA: Diagnosis present

## 2020-08-17 DIAGNOSIS — R1084 Generalized abdominal pain: Secondary | ICD-10-CM | POA: Diagnosis not present

## 2020-08-17 DIAGNOSIS — Z79899 Other long term (current) drug therapy: Secondary | ICD-10-CM

## 2020-08-17 DIAGNOSIS — R109 Unspecified abdominal pain: Secondary | ICD-10-CM | POA: Diagnosis present

## 2020-08-17 DIAGNOSIS — K219 Gastro-esophageal reflux disease without esophagitis: Secondary | ICD-10-CM | POA: Diagnosis present

## 2020-08-17 DIAGNOSIS — Z7984 Long term (current) use of oral hypoglycemic drugs: Secondary | ICD-10-CM

## 2020-08-17 DIAGNOSIS — G4733 Obstructive sleep apnea (adult) (pediatric): Secondary | ICD-10-CM | POA: Diagnosis present

## 2020-08-17 DIAGNOSIS — Z8249 Family history of ischemic heart disease and other diseases of the circulatory system: Secondary | ICD-10-CM

## 2020-08-17 DIAGNOSIS — G4730 Sleep apnea, unspecified: Secondary | ICD-10-CM | POA: Diagnosis present

## 2020-08-17 DIAGNOSIS — E1129 Type 2 diabetes mellitus with other diabetic kidney complication: Secondary | ICD-10-CM | POA: Diagnosis present

## 2020-08-17 DIAGNOSIS — I10 Essential (primary) hypertension: Secondary | ICD-10-CM | POA: Diagnosis not present

## 2020-08-17 DIAGNOSIS — E119 Type 2 diabetes mellitus without complications: Secondary | ICD-10-CM

## 2020-08-17 DIAGNOSIS — C787 Secondary malignant neoplasm of liver and intrahepatic bile duct: Secondary | ICD-10-CM | POA: Diagnosis present

## 2020-08-17 DIAGNOSIS — I129 Hypertensive chronic kidney disease with stage 1 through stage 4 chronic kidney disease, or unspecified chronic kidney disease: Secondary | ICD-10-CM | POA: Diagnosis present

## 2020-08-17 DIAGNOSIS — Z9119 Patient's noncompliance with other medical treatment and regimen: Secondary | ICD-10-CM

## 2020-08-17 DIAGNOSIS — R338 Other retention of urine: Secondary | ICD-10-CM | POA: Diagnosis present

## 2020-08-17 DIAGNOSIS — Z87891 Personal history of nicotine dependence: Secondary | ICD-10-CM

## 2020-08-17 DIAGNOSIS — Z880 Allergy status to penicillin: Secondary | ICD-10-CM

## 2020-08-17 DIAGNOSIS — Z7982 Long term (current) use of aspirin: Secondary | ICD-10-CM

## 2020-08-17 DIAGNOSIS — Z515 Encounter for palliative care: Secondary | ICD-10-CM

## 2020-08-17 DIAGNOSIS — Z881 Allergy status to other antibiotic agents status: Secondary | ICD-10-CM

## 2020-08-17 DIAGNOSIS — Z833 Family history of diabetes mellitus: Secondary | ICD-10-CM

## 2020-08-17 LAB — COMPREHENSIVE METABOLIC PANEL
ALT: 51 U/L — ABNORMAL HIGH (ref 0–44)
AST: 78 U/L — ABNORMAL HIGH (ref 15–41)
Albumin: 3.3 g/dL — ABNORMAL LOW (ref 3.5–5.0)
Alkaline Phosphatase: 209 U/L — ABNORMAL HIGH (ref 38–126)
Anion gap: 7 (ref 5–15)
BUN: 21 mg/dL (ref 8–23)
CO2: 25 mmol/L (ref 22–32)
Calcium: 9.9 mg/dL (ref 8.9–10.3)
Chloride: 102 mmol/L (ref 98–111)
Creatinine, Ser: 1.39 mg/dL — ABNORMAL HIGH (ref 0.61–1.24)
GFR, Estimated: 52 mL/min — ABNORMAL LOW (ref >60)
Glucose, Bld: 151 mg/dL — ABNORMAL HIGH (ref 70–99)
Potassium: 4.1 mmol/L (ref 3.5–5.1)
Sodium: 134 mmol/L — ABNORMAL LOW (ref 135–145)
Total Bilirubin: 1.1 mg/dL (ref 0.3–1.2)
Total Protein: 6.7 g/dL (ref 6.5–8.1)

## 2020-08-17 LAB — CBC
HCT: 35.4 % — ABNORMAL LOW (ref 39.0–52.0)
Hemoglobin: 11.5 g/dL — ABNORMAL LOW (ref 13.0–17.0)
MCH: 26.3 pg (ref 26.0–34.0)
MCHC: 32.5 g/dL (ref 30.0–36.0)
MCV: 80.8 fL (ref 80.0–100.0)
Platelets: 175 10*3/uL (ref 150–400)
RBC: 4.38 MIL/uL (ref 4.22–5.81)
RDW: 13.2 % (ref 11.5–15.5)
WBC: 9.4 10*3/uL (ref 4.0–10.5)
nRBC: 0 % (ref 0.0–0.2)

## 2020-08-17 LAB — LIPASE, BLOOD: Lipase: 22 U/L (ref 11–51)

## 2020-08-17 MED ORDER — POLYETHYLENE GLYCOL 3350 17 G PO PACK
17.0000 g | PACK | Freq: Every day | ORAL | Status: DC
  Administered 2020-08-18 – 2020-08-20 (×3): 17 g via ORAL
  Filled 2020-08-17 (×3): qty 1

## 2020-08-17 MED ORDER — SENNOSIDES-DOCUSATE SODIUM 8.6-50 MG PO TABS
1.0000 | ORAL_TABLET | Freq: Two times a day (BID) | ORAL | Status: DC
  Administered 2020-08-18 – 2020-08-20 (×5): 1 via ORAL
  Filled 2020-08-17 (×5): qty 1

## 2020-08-17 MED ORDER — ONDANSETRON HCL 4 MG/2ML IJ SOLN
4.0000 mg | Freq: Four times a day (QID) | INTRAMUSCULAR | Status: DC | PRN
  Administered 2020-08-19: 01:00:00 4 mg via INTRAVENOUS
  Filled 2020-08-17: qty 2

## 2020-08-17 MED ORDER — MORPHINE SULFATE (PF) 4 MG/ML IV SOLN
4.0000 mg | Freq: Once | INTRAVENOUS | Status: AC
  Administered 2020-08-17: 4 mg via INTRAVENOUS
  Filled 2020-08-17: qty 1

## 2020-08-17 MED ORDER — MORPHINE SULFATE (PF) 2 MG/ML IV SOLN
1.0000 mg | INTRAVENOUS | Status: DC | PRN
  Administered 2020-08-19 – 2020-08-20 (×2): 1 mg via INTRAVENOUS
  Filled 2020-08-17 (×2): qty 1

## 2020-08-17 MED ORDER — SODIUM CHLORIDE 0.9 % IV BOLUS
500.0000 mL | Freq: Once | INTRAVENOUS | Status: AC
  Administered 2020-08-17: 22:00:00 500 mL via INTRAVENOUS

## 2020-08-17 MED ORDER — HYDROCODONE-ACETAMINOPHEN 10-325 MG PO TABS
1.0000 | ORAL_TABLET | ORAL | Status: DC
  Administered 2020-08-17 – 2020-08-18 (×2): 1 via ORAL
  Filled 2020-08-17 (×2): qty 1

## 2020-08-17 MED ORDER — ENOXAPARIN SODIUM 40 MG/0.4ML ~~LOC~~ SOLN
40.0000 mg | SUBCUTANEOUS | Status: DC
  Administered 2020-08-17 – 2020-08-19 (×3): 40 mg via SUBCUTANEOUS
  Filled 2020-08-17 (×3): qty 0.4

## 2020-08-17 MED ORDER — ONDANSETRON HCL 4 MG/2ML IJ SOLN
4.0000 mg | Freq: Once | INTRAMUSCULAR | Status: AC
  Administered 2020-08-17: 4 mg via INTRAVENOUS
  Filled 2020-08-17: qty 2

## 2020-08-17 NOTE — ED Notes (Signed)
Bladder scan performed. 67ml max volume found in bladder

## 2020-08-17 NOTE — H&P (Signed)
History and Physical    Ricky Riley:[id number] DOB: [birth year] DOA: 08/17/2020  PCP: Baxter Hire, MD  Patient coming from: Home, son at bedside I have personally briefly reviewed patient's old medical records in [place]  Chief Complaint: Worsening abdominal pain  HPI: Ricky Riley is a 80 y.o. 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  Patient has had ongoing abdominal pain for at least the past month.  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  He has had follow-up with oncology and had ultrasound-guided liver biopsy yesterday on 3/8.  States he previously received tramadol for his pain which did not work.  He has most recently been taking hydrocodone 5 mg every 4 hours but pain would come back as soon as medications wore off.  Pain is mostly to the right side of the abdomen but overall is diffused.  Pain is sharp and crampy.  He also notes constipation and no bowel movement for the past 3 to 4 days.  Has not been taking any stool softeners with his pain medication. Has had flatulence.  Sometimes feels nauseous but no vomiting.  Also has noticed some feelings of urinary retention.  No fever.  Has decreased appetite and has felt generalized weakness for the past several weeks.   ED Course: He was afebrile, normotensive. Bladder scan performed for symptoms of urinary retention but only 40 mg max value of found.  BC shows no leukocytosis and stable anemia with hemoglobin of 11.5.  Elevated creatinine of 1.39 from a prior normal of 1.2.  Mildly elevated AST of 78, ALT of 51, alkaline phosphatase of 209.  Normal total  bilirubin. He received several doses of morphine in the ED with some relief of pain.  ED physician Dr. Ermalinda Barrios discussed case with oncology and they will follow in consultation tomorrow.  Review of Systems: Constitutional: + Weight Change, No Fever ENT/Mouth: No sore throat, No Rhinorrhea Eyes: No Eye Pain, No Vision Changes Cardiovascular: No Chest Pain, no SOB Respiratory: No Cough, No Sputum, No Wheezing, no Dyspnea  Gastrointestinal: + Nausea, No Vomiting, No Diarrhea, + Constipation, + Pain Genitourinary: no Urinary Incontinence, No Urgency, No Flank Pain Musculoskeletal: No Arthralgias, No Myalgias Skin: No Skin Lesions, No Pruritus, Neuro: + Weakness, No Numbness Psych: No Anxiety/Panic, No Depression, + decrease appetite Heme/Lymph: No Bruising, No Bleeding  Past Medical History:  Diagnosis Date  . Arthritis   . Cancer associated pain   . Diabetes mellitus without complication ([place])   . Dysrhythmia   . GERD (gastroesophageal reflux disease)   . Headache   . Hypertension   . Sleep apnea    CPAP     Past Surgical History:  Procedure Laterality Date  . ARTERY BIOPSY Left 03/02/2015   Procedure: BIOPSY TEMPORAL ARTERY;  Surgeon: Clyde Canterbury, MD;  Location: [facility];  Service: ENT;  Laterality: Left;  . HERNIA REPAIR    . LEFT HEART CATH AND CORONARY ANGIOGRAPHY Left 01/27/2020   Procedure: LEFT HEART CATH AND CORONARY ANGIOGRAPHY;  Surgeon: Teodoro Spray, MD;  Location: [facility];  Service: Cardiovascular;  Laterality: Left;  . PENILE PROSTHESIS  IMPLANT    . TRANSURETHRAL RESECTION OF PROSTATE    . XI ROBOTIC ASSISTED INGUINAL HERNIA REPAIR WITH MESH Left 04/22/2019   Procedure: XI ROBOTIC ASSISTED INGUINAL HERNIA REPAIR WITH MESH;  Surgeon: Herbert Pun, MD;  Location: [facility];  Service: General;  Laterality: Left;     reports that he quit smoking about 11 years ago. His smoking use included cigarettes. He smoked 1.00 pack per day. He has never  used smokeless tobacco. He reports that he does not drink alcohol and does not use drugs. Social History  Allergies  Allergen Reactions  . Atorvastatin Other (See Comments)  . Flunisolide   . Lisinopril   . Rosuvastatin     Other reaction(s): Cramp in lower limb  . Ciprofloxacin Hives  . Penicillins Hives    Did it involve swelling of the face/tongue/throat, SOB, or low BP? No Did it involve sudden or severe rash/hives, skin peeling, or any reaction on the inside of your mouth or nose? Unknown Did you need to seek medical attention at a hospital or doctor's office? No When did it last happen?30-40 years ago If all above answers are "NO", may proceed with cephalosporin use.   . Sulfa Antibiotics Hives  . Sulfasalazine Hives    Family History  Problem Relation Age of Onset  . Diabetes Mother   . Heart disease Mother   . Diabetes Father   . Heart disease Father   . Cancer Father 19  . Diabetes Sister   . Diabetes Brother   . Kidney failure Brother      Prior to Admission medications   Medication Sig Start Date End Date Taking? Authorizing Provider  acetaminophen (TYLENOL) 500 MG tablet Take 500 mg by mouth every 6 (six) hours as needed.    [provider]  aspirin 81 MG EC tablet Take 1 tablet by mouth daily. 02/03/19   [provider]  cetirizine (ZYRTEC) 10 MG tablet Take 10 mg by mouth daily as needed for allergies.    [provider]  finasteride (PROSCAR) 5 MG tablet Take 5 mg by mouth daily.    [provider]  fluticasone (FLONASE) 50 MCG/ACT nasal spray Place into the nose. 02/08/20 02/07/21  [provider]  HYDROcodone-acetaminophen (NORCO) 5-325 MG tablet Take 1-2 tablets by mouth every 4 (four) hours as needed for moderate pain or severe pain. 08/15/20   Borders, Kirt Boys, NP  losartan (COZAAR) 25 MG tablet Take 12.5 mg by mouth daily.  Patient not taking: Reported on 08/16/2020 04/09/19   [provider]  metFORMIN  (GLUCOPHAGE) 500 MG tablet Take 500 mg by mouth daily with breakfast.    [provider]  mirtazapine (REMERON) 15 MG tablet Take 15 mg by mouth at bedtime as needed (sleep).  08/21/10   [provider]  omeprazole (PRILOSEC) 20 MG capsule Take 20 mg by mouth daily before breakfast.     [provider]  ondansetron (ZOFRAN ODT) 4 MG disintegrating tablet Take 1 tablet (4 mg total) by mouth every 8 (eight) hours as needed for nausea or vomiting. 08/14/20   Naaman Plummer, MD  senna-docusate (SENOKOT-S) 8.6-50 MG tablet Take 1 tablet by mouth 2 (two) times daily. 08/15/20   Cammie Sickle, MD  terazosin (HYTRIN) 10 MG capsule Take 10 mg by mouth at bedtime.    [provider]  traZODone (DESYREL) 50 MG tablet Take 50 mg by mouth daily as needed for sleep.  01/17/20   [provider]    Physical Exam: Vitals:   08/17/20 1620 08/17/20 1622 08/17/20 1930  BP:  108/66 (!) 143/76  Pulse: 95  78  Resp: 18  18  Temp: 97.6 F (36.4 C)  97.9 F (36.6 C)  TempSrc: Oral  Oral  SpO2: 96%  94%  Weight: 83.9 kg    Height: 6\' 3"  (1.905 m)      Constitutional: NAD, calm, comfortable, elderly gentleman lying flat in bed Vitals:   08/17/20 1620 08/17/20 1622 08/17/20 1930  BP:  108/66 (!) 143/76  Pulse: 95  78  Resp: 18  18  Temp: 97.6 F (36.4 C)  97.9 F (36.6 C)  TempSrc: Oral  Oral  SpO2: 96%  94%  Weight: 83.9 kg    Height: 6\' 3"  (1.905 m)     Eyes: PERRL, lids and conjunctivae normal ENMT: Mucous membranes are moist.  Neck: normal, supple Respiratory: clear to auscultation bilaterally, no wheezing, no crackles. Normal respiratory effort. No accessory muscle use.  Cardiovascular: Regular rate and rhythm, no murmurs / rubs / gallops. No extremity edema.  Abdomen: Diffuse moderate tenderness without any rigidity, guarding or rebound tenderness, no masses palpated.  Hyperactive bowel sounds throughout all quadrants.   Musculoskeletal: no  clubbing / cyanosis. No joint deformity upper and lower extremities. Good ROM, no contractures. Normal muscle tone.  Skin: no rashes, lesions, ulcers. No induration Neurologic: CN 2-12 grossly intact. Sensation intact,  Strength 5/5 in all 4.  Psychiatric: Normal judgment and insight. Alert and oriented x 3. Normal mood.     Labs on Admission: I have personally reviewed following labs and imaging studies  CBC: Recent Labs  Lab 08/11/20 1913 08/14/20 0908 08/16/20 0847 08/17/20 1705  WBC 8.1 7.9 8.8 9.4  HGB 12.8* 11.8* 12.0* 11.5*  HCT 38.1* 36.3* 37.2* 35.4*  MCV 79.9* 80.0 81.6 80.8  PLT 177 180 183 035   Basic Metabolic Panel: Recent Labs  Lab 08/11/20 1913 08/12/20 0945 08/14/20 0908 08/17/20 1705  NA 131* 132* 134* 134*  K 3.8 4.0 3.9 4.1  CL 99 96* 102 102  CO2 22 24 22 25   GLUCOSE 114* 163* 125* 151*  BUN 17 23 19 21   CREATININE 1.20 1.41* 1.36* 1.39*  CALCIUM 10.1 10.2 10.0 9.9   GFR: Estimated Creatinine Clearance: 52.8 mL/min (A) (by C-G formula based on SCr of 1.39 mg/dL (H)). Liver Function Tests: Recent Labs  Lab 08/11/20 1913 08/12/20 0945 08/14/20 0908 08/17/20 1705  AST 62* 58* 52* 78*  ALT 62* 56* 45* 51*  ALKPHOS 195* 180* 194* 209*  BILITOT 1.2 1.0 1.1 1.1  PROT 7.8 7.4 7.2 6.7  ALBUMIN 4.1 3.8 3.6 3.3*   Recent Labs  Lab 08/11/20 1913 08/14/20 0908 08/17/20 1705  LIPASE 27 26 22    No results for input(s): AMMONIA in the last 168 hours. Coagulation Profile: Recent Labs  Lab 08/12/20 0945 08/16/20 0847  INR 1.3* 1.2   Cardiac Enzymes: No results for input(s): CKTOTAL, CKMB, CKMBINDEX, TROPONINI in the last 168 hours. BNP (last 3 results) No results for input(s): PROBNP in the last 8760 hours. HbA1C: No results for input(s): HGBA1C in the last 72 hours. CBG: Recent Labs  Lab 08/16/20 0904 08/16/20 1036  GLUCAP 118* 105*   Lipid Profile: No results for input(s): CHOL, HDL, LDLCALC, TRIG, CHOLHDL, LDLDIRECT in the last  72 hours. Thyroid Function Tests: No results for input(s): TSH, T4TOTAL, FREET4, T3FREE, THYROIDAB in the last 72 hours. Anemia Panel: No results for  input(s): VITAMINB12, FOLATE, FERRITIN, TIBC, IRON, RETICCTPCT in the last 72 hours. Urine analysis:    Component Value Date/Time   COLORURINE YELLOW (A) 08/14/2020 0908   APPEARANCEUR CLEAR (A) 08/14/2020 0908   LABSPEC 1.015 08/14/2020 0908   PHURINE 5.0 08/14/2020 0908   GLUCOSEU NEGATIVE 08/14/2020 0908   HGBUR NEGATIVE 08/14/2020 0908   BILIRUBINUR NEGATIVE 08/14/2020 0908   KETONESUR NEGATIVE 08/14/2020 0908   PROTEINUR NEGATIVE 08/14/2020 0908   NITRITE NEGATIVE 08/14/2020 0908   LEUKOCYTESUR NEGATIVE 08/14/2020 0908    Radiological Exams on Admission: US BIOPSY (LIVER)  Result Date: 08/16/2020 INDICATION: Pancreatic mass and multiple liver lesions EXAM: Ultrasound-guided liver lesion biopsy MEDICATIONS: None. ANESTHESIA/SEDATION: Moderate (conscious) sedation was employed during this procedure. A total of Versed 1 mg and Fentanyl 50 mcg was administered intravenously. Moderate Sedation Time: The 15 minutes. The patient's level of consciousness and vital signs were monitored continuously by radiology nursing throughout the procedure under my direct supervision. COMPLICATIONS: None immediate. PROCEDURE: Informed written consent was obtained from the patient after a thorough discussion of the procedural risks, benefits and alternatives. All questions were addressed. Maximal Sterile Barrier Technique was utilized including caps, mask, sterile gowns, sterile gloves, sterile drape, hand hygiene and skin antiseptic. A timeout was performed prior to the initiation of the procedure. Patient position left lateral decubitus on the ultrasound table. Right upper quadrant skin prepped and draped in usual sterile fashion. Following local lidocaine administration, 17 gauge introducer needle was advanced into 1 of the right liver lesions, and four 18  gauge cores were obtained utilizing continuous ultrasound guidance. Gelfoam slurry was administered through the introducer needle at the biopsy site. Samples were sent to pathology in formalin. Needle removed and hemostasis achieved with 5 minutes of manual compression. Post procedure ultrasound images showed no evidence of significant hemorrhage. IMPRESSION: Ultrasound-guided biopsy of right liver mass. Electronically Signed   By: Miachel Roux M.D.   On: 08/16/2020 10:34      Assessment/Plan  Abdominal pain in the setting of recent diagnosis of pancreatic mass with likely mets to liver Patient most recently has been taking hydrocodone 5 mg every 4 hours at home with worsening pain He also has been having constipation which I suspect is more due to not being on any stool softeners while on chronic opioids.  Low suspicion of any obstruction based on benign abdominal exam and his history of opioid use. 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  Result is pending. Consider oncology consult in the morning to adjust pain regimen at home  AKI on CKD stage IIIa Creatinine 1.39 from prior 1.2 on admission Give 500 cc bolus and follow BMP in the morning  Transaminitis AST and ALT mildly elevated.  Likely secondary to probable liver metastasis  Hypertension Hold Losartan due to AKI  Type 2 diabetes Last HbA1C of 7.2 in 2021. Repeat. glucose controlled on initial lab. Follow with morning labs  OSA Noncompliant with CPAP  DVT prophylaxis:.Lovenox Code Status: Full Family Communication: Plan discussed with patient at bedside  disposition Plan: Home with observation Consults called:  Admission status: Observation  Level of care: Med-Surg  Status is: Observation  The patient remains OBS appropriate and will d/c  before 2 midnights.  Dispo: The patient is from: Home  Anticipated d/c is to: Home              Patient currently is not medically stable to d/c.   Difficult to place patient No         Orene Desanctis DO [facility]   If 7PM-7AM, please contact night-coverage [email or web address]   08/17/2020, 8:21 PM

## 2020-08-17 NOTE — ED Triage Notes (Addendum)
Pt to triage via wheelchair.  Pt has right side abd pain.  States mass on pancrease.   Pt was seen at cancer center yesterday and a stent was placed.  Today, pt has increased abd pain.no v/d  Pt alert  Speech clear.

## 2020-08-17 NOTE — ED Provider Notes (Signed)
[facility] Emergency Department Provider Note   ____________________________________________    I have reviewed the triage vital signs and the nursing notes.   HISTORY  Chief Complaint Abdominal Pain     HPI Ricky Riley is a 79 y.o. male with a history of pancreatic mass with likely metastases to the liver who is currently being evaluated by oncology presents with complaints of abdominal pain.  Patient reports this is his typical pain that he has been having and his home meds are not working.  He did have a liver biopsy performed yesterday but he reports he had been doing well yesterday, pain seems to have returned today.  Denies nausea or vomiting.  Denies fevers or chills.  Does admit to some difficulty urinating  Past Medical History:  Diagnosis Date  . Arthritis   . Cancer associated pain   . Diabetes mellitus without complication ([place])   . Dysrhythmia   . GERD (gastroesophageal reflux disease)   . Headache   . Hypertension   . Sleep apnea    CPAP     Patient Active Problem List   Diagnosis Date Noted  . Lesion of liver 08/12/2020  . Pain due to onychomycosis of toenails of both feet 03/31/2020  . Diabetes mellitus without complication ([place]) 08/67/6195  . Lobar pneumonia ([place]) 12/13/2019  . CAP (community acquired pneumonia) 12/12/2019  . Elevated troponin 12/12/2019  . GERD (gastroesophageal reflux disease) 12/12/2019  . Acute renal failure superimposed on stage 3a chronic kidney disease ([place]) 12/12/2019  . Type II diabetes mellitus with renal manifestations ([place]) 12/12/2019  . Left inguinal hernia 04/22/2019  . Closed fracture of body of sternum with delayed healing 04/30/2017  . SOB (shortness of breath) 03/22/2017  . Chest pain, musculoskeletal 02/05/2017  . Acute pain of right shoulder 01/11/2017  . Calf cramp 12/31/2016  . Right thyroid nodule 04/20/2016  . Primary osteoarthritis of right knee 03/30/2016  . Abnormal CT  scan, neck 03/21/2016  . Hypercalcemia 03/12/2016  . Chronic cough 01/23/2016  . Current chronic use of systemic steroids 01/23/2016  . Headache 01/26/2015  . Heart palpitations 03/17/2014  . Hypertension 03/17/2014  . Sleep apnea 03/17/2014  . Male erectile dysfunction 01/15/2013  . 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 ARTERY BIOPSY Left 03/02/2015   Procedure: BIOPSY TEMPORAL ARTERY;  Surgeon: Clyde Canterbury, MD;  Location: [facility];  Service: ENT;  Laterality: Left;  . HERNIA REPAIR    . LEFT HEART CATH AND CORONARY ANGIOGRAPHY Left 01/27/2020   Procedure: LEFT HEART CATH AND CORONARY ANGIOGRAPHY;  Surgeon: Teodoro Spray, MD;  Location: [facility];  Service: Cardiovascular;  Laterality: Left;  . PENILE PROSTHESIS IMPLANT    . TRANSURETHRAL RESECTION OF PROSTATE    . XI ROBOTIC ASSISTED INGUINAL HERNIA REPAIR WITH MESH Left 04/22/2019   Procedure: XI ROBOTIC ASSISTED INGUINAL HERNIA REPAIR WITH MESH;  Surgeon: Herbert Pun, MD;  Location: [facility];  Service: General;  Laterality: Left;    Prior to Admission medications   Medication Sig Start Date End Date Taking? Authorizing Provider  acetaminophen (TYLENOL) 500 MG tablet Take 500 mg by mouth every 6 (six) hours as needed.    [provider]  aspirin 81 MG EC tablet Take 1 tablet by mouth daily. 02/03/19   [provider]  cetirizine (ZYRTEC) 10 MG tablet Take 10  mg by mouth daily as needed for allergies.    [provider]  finasteride (PROSCAR) 5 MG tablet Take 5 mg by mouth daily.    [provider]  fluticasone (FLONASE) 50 MCG/ACT nasal spray Place into the nose. 02/08/20 02/07/21  [provider]  HYDROcodone-acetaminophen (NORCO) 5-325 MG tablet Take 1-2 tablets by mouth every 4 (four) hours as needed for moderate pain or  severe pain. 08/15/20   Borders, Kirt Boys, NP  losartan (COZAAR) 25 MG tablet Take 12.5 mg by mouth daily.  Patient not taking: Reported on 08/16/2020 04/09/19   [provider]  metFORMIN (GLUCOPHAGE) 500 MG tablet Take 500 mg by mouth daily with breakfast.    [provider]  mirtazapine (REMERON) 15 MG tablet Take 15 mg by mouth at bedtime as needed (sleep).  08/21/10   [provider]  omeprazole (PRILOSEC) 20 MG capsule Take 20 mg by mouth daily before breakfast.     [provider]  ondansetron (ZOFRAN ODT) 4 MG disintegrating tablet Take 1 tablet (4 mg total) by mouth every 8 (eight) hours as needed for nausea or vomiting. 08/14/20   Naaman Plummer, MD  senna-docusate (SENOKOT-S) 8.6-50 MG tablet Take 1 tablet by mouth 2 (two) times daily. 08/15/20   Cammie Sickle, MD  terazosin (HYTRIN) 10 MG capsule Take 10 mg by mouth at bedtime.    [provider]  traZODone (DESYREL) 50 MG tablet Take 50 mg by mouth daily as needed for sleep.  01/17/20   [provider]     Allergies Atorvastatin, Flunisolide, Lisinopril, Rosuvastatin, Ciprofloxacin, Penicillins, Sulfa antibiotics, and Sulfasalazine  Family History  Problem Relation Age of Onset  . Diabetes Mother   . Heart disease Mother   . Diabetes Father   . Heart disease Father   . Cancer Father 71  . Diabetes Sister   . Diabetes Brother   . Kidney failure Brother     Social History Social History   Tobacco Use  . Smoking status: Former Smoker    Packs/day: 1.00    Types: Cigarettes    Quit date: 06/05/2009    Years since quitting: 11.2  . Smokeless tobacco: Never Used  Vaping Use  . Vaping Use: Never used  Substance Use Topics  . Alcohol use: No  . Drug use: No    Review of Systems  Constitutional: No fever/chills Eyes: No visual changes.  ENT: No sore throat. Cardiovascular: Denies chest pain. Respiratory: Denies shortness of breath. Gastrointestinal: As  above Genitourinary: Difficulty urinating, denies dysuria Musculoskeletal: Negative for back pain. Skin: Negative for rash. Neurological: Negative for headaches   ____________________________________________   PHYSICAL EXAM:  VITAL SIGNS: ED Triage Vitals  Enc Vitals Group     BP 08/17/20 1622 108/66     Pulse Rate 08/17/20 1620 95     Resp 08/17/20 1620 18     Temp 08/17/20 1620 97.6 F (36.4 C)     Temp Source 08/17/20 1620 Oral     SpO2 08/17/20 1620 96 %     Weight 08/17/20 1620 83.9 kg (185 lb)     Height 08/17/20 1620 1.905 m (6\' 3" )     Head Circumference --      Peak Flow --      Pain Score 08/17/20 1620 9     Pain Loc --      Pain Edu? --      Excl. in [place]? --     Constitutional: Alert  and oriented.   Nose: No congestion/rhinnorhea. Mouth/Throat: Mucous membranes are moist.    Cardiovascular: Normal rate, regular rhythm. Grossly normal heart sounds.  Good peripheral circulation. Respiratory: Normal respiratory effort.  No retractions. Lungs CTAB. Gastrointestinal: Overall soft, no distention, mild tenderness in upper quadrants and epigastrically, no CVA tenderness.  No evidence of rash infection or fluctuance overlying the biopsy site  Musculoskeletal:   Warm and well perfused Neurologic:  Normal speech and language. No gross focal neurologic deficits are appreciated.  Skin:  Skin is warm, dry and intact. No rash noted. Psychiatric: Mood and affect are normal. Speech and behavior are normal.  ____________________________________________   LABS (all labs ordered are listed, but only abnormal results are displayed)  Labs Reviewed  CBC - Abnormal; Notable for the following components:      Result Value   Hemoglobin 11.5 (*)    HCT 35.4 (*)    All other components within normal limits  COMPREHENSIVE METABOLIC PANEL - Abnormal; Notable for the following components:   Sodium 134 (*)    Glucose, Bld 151 (*)    Creatinine, Ser 1.39 (*)    Albumin 3.3 (*)     AST 78 (*)    ALT 51 (*)    Alkaline Phosphatase 209 (*)    GFR, Estimated 52 (*)    All other components within normal limits  SARS CORONAVIRUS 2 (TAT 6-24 HRS)  LIPASE, BLOOD  URINALYSIS, COMPLETE (UACMP) WITH MICROSCOPIC   ____________________________________________  EKG  None ____________________________________________  RADIOLOGY  CT reviewed from March 3 ____________________________________________   PROCEDURES  Procedure(s) performed: No  Procedures   Critical Care performed: No ____________________________________________   INITIAL IMPRESSION / ASSESSMENT AND PLAN / ED COURSE  Pertinent labs & imaging results that were available during my care of the patient were reviewed by me and considered in my medical decision making (see chart for details).  Patient presents with epigastric and right upper quadrant pain, he reports this is his "usual "pain.  Medical records reviewed, patient with large pancreatic mass likely mets to the liver, had liver biopsy ultrasound-guided yesterday, tolerated that procedure well.  Reports today he has had a return of his pain is not being controlled by his medications.  Denies nausea vomiting or diarrhea.  Does report some difficulty urinating but he is able to urinate when he sits down.  No fevers, lab work is not significantly changed from prior.  Patient treat with IV morphine, IV Zofran  Additional dose of IV morphine given, patient is feeling somewhat improved but still having significant pain.  Will discuss with the hospitalist for admission for cancer associated pain    ____________________________________________   FINAL CLINICAL IMPRESSION(S) / ED DIAGNOSES  Final diagnoses:  Cancer associated pain        Note:  This document was prepared using Dragon voice recognition software and may include unintentional dictation errors.   Lavonia Drafts, MD 08/17/20 [phone]

## 2020-08-18 ENCOUNTER — Observation Stay: Payer: Medicare Other

## 2020-08-18 ENCOUNTER — Ambulatory Visit: Payer: Medicare Other

## 2020-08-18 ENCOUNTER — Ambulatory Visit: Payer: Medicare Other | Admitting: Podiatry

## 2020-08-18 DIAGNOSIS — N179 Acute kidney failure, unspecified: Secondary | ICD-10-CM | POA: Diagnosis not present

## 2020-08-18 DIAGNOSIS — N1831 Chronic kidney disease, stage 3a: Secondary | ICD-10-CM | POA: Diagnosis not present

## 2020-08-18 DIAGNOSIS — Z515 Encounter for palliative care: Secondary | ICD-10-CM

## 2020-08-18 DIAGNOSIS — R1084 Generalized abdominal pain: Secondary | ICD-10-CM | POA: Diagnosis not present

## 2020-08-18 DIAGNOSIS — K8689 Other specified diseases of pancreas: Secondary | ICD-10-CM | POA: Diagnosis not present

## 2020-08-18 LAB — CBC
HCT: 36 % — ABNORMAL LOW (ref 39.0–52.0)
Hemoglobin: 11.8 g/dL — ABNORMAL LOW (ref 13.0–17.0)
MCH: 26.2 pg (ref 26.0–34.0)
MCHC: 32.8 g/dL (ref 30.0–36.0)
MCV: 79.8 fL — ABNORMAL LOW (ref 80.0–100.0)
Platelets: 193 10*3/uL (ref 150–400)
RBC: 4.51 MIL/uL (ref 4.22–5.81)
RDW: 13.2 % (ref 11.5–15.5)
WBC: 9.4 10*3/uL (ref 4.0–10.5)
nRBC: 0 % (ref 0.0–0.2)

## 2020-08-18 LAB — GLUCOSE, CAPILLARY
Glucose-Capillary: 129 mg/dL — ABNORMAL HIGH (ref 70–99)
Glucose-Capillary: 145 mg/dL — ABNORMAL HIGH (ref 70–99)
Glucose-Capillary: 145 mg/dL — ABNORMAL HIGH (ref 70–99)

## 2020-08-18 LAB — SARS CORONAVIRUS 2 (TAT 6-24 HRS): SARS Coronavirus 2: NEGATIVE

## 2020-08-18 LAB — BASIC METABOLIC PANEL
Anion gap: 8 (ref 5–15)
BUN: 22 mg/dL (ref 8–23)
CO2: 24 mmol/L (ref 22–32)
Calcium: 9.8 mg/dL (ref 8.9–10.3)
Chloride: 103 mmol/L (ref 98–111)
Creatinine, Ser: 1.52 mg/dL — ABNORMAL HIGH (ref 0.61–1.24)
GFR, Estimated: 47 mL/min — ABNORMAL LOW (ref >60)
Glucose, Bld: 145 mg/dL — ABNORMAL HIGH (ref 70–99)
Potassium: 4.2 mmol/L (ref 3.5–5.1)
Sodium: 135 mmol/L (ref 135–145)

## 2020-08-18 LAB — HEMOGLOBIN A1C
Hgb A1c MFr Bld: 6.4 % — ABNORMAL HIGH (ref 4.8–5.6)
Mean Plasma Glucose: 136.98 mg/dL

## 2020-08-18 MED ORDER — PANTOPRAZOLE SODIUM 40 MG PO TBEC
40.0000 mg | DELAYED_RELEASE_TABLET | Freq: Every day | ORAL | Status: DC
  Administered 2020-08-18 – 2020-08-20 (×3): 40 mg via ORAL
  Filled 2020-08-18 (×3): qty 1

## 2020-08-18 MED ORDER — HYDROCODONE-ACETAMINOPHEN 5-325 MG PO TABS
1.0000 | ORAL_TABLET | ORAL | Status: DC | PRN
  Administered 2020-08-18 – 2020-08-19 (×3): 1 via ORAL
  Filled 2020-08-18 (×3): qty 1

## 2020-08-18 MED ORDER — INSULIN ASPART 100 UNIT/ML ~~LOC~~ SOLN
0.0000 [IU] | Freq: Three times a day (TID) | SUBCUTANEOUS | Status: DC
  Administered 2020-08-18 – 2020-08-20 (×5): 1 [IU] via SUBCUTANEOUS
  Administered 2020-08-20: 2 [IU] via SUBCUTANEOUS
  Filled 2020-08-18 (×6): qty 1

## 2020-08-18 MED ORDER — HYDROCODONE-ACETAMINOPHEN 10-325 MG PO TABS: 2.0000 | ORAL_TABLET | ORAL | Status: DC

## 2020-08-18 MED ORDER — DOCUSATE SODIUM 100 MG PO CAPS
200.0000 mg | ORAL_CAPSULE | Freq: Two times a day (BID) | ORAL | Status: DC
  Administered 2020-08-18 – 2020-08-20 (×5): 200 mg via ORAL
  Filled 2020-08-18 (×5): qty 2

## 2020-08-18 MED ORDER — MIRTAZAPINE 15 MG PO TABS
15.0000 mg | ORAL_TABLET | Freq: Every evening | ORAL | Status: DC | PRN
Start: 2020-08-18 — End: 2020-08-20
  Administered 2020-08-18: 20:00:00 15 mg via ORAL
  Filled 2020-08-18: qty 1

## 2020-08-18 MED ORDER — FINASTERIDE 5 MG PO TABS
5.0000 mg | ORAL_TABLET | Freq: Every day | ORAL | Status: DC
  Administered 2020-08-18 – 2020-08-20 (×3): 5 mg via ORAL
  Filled 2020-08-18 (×3): qty 1

## 2020-08-18 MED ORDER — ASPIRIN EC 81 MG PO TBEC
81.0000 mg | DELAYED_RELEASE_TABLET | Freq: Every day | ORAL | Status: DC
  Administered 2020-08-18 – 2020-08-20 (×3): 81 mg via ORAL
  Filled 2020-08-18 (×3): qty 1

## 2020-08-18 NOTE — Evaluation (Signed)
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   Clinical Impression   Pt was seen for OT evaluation this date. Prior to hospital admission, pt was ambulating without AD (however endorses holding onto furniture to steady himself, particularly in the mornings) and independent with ADL. Pt reports doing majority of housekeeping and meal prep 2/2 wife's health (uses rollator, home O2, and low vision). Pt denies falls and reports still driving and was doing yardwork. Pt alert, oriented x3 (pt shares he's unsure why he is having so much pain and questions whether it may be due to Covid he had in Jan 2021 or PNA he had in June 2021). Pt able to follow commands well. Increased effort/time for bed mobility 2/2 pain. Able to reach down to socks to adjust without assist however endorses increased pain with the task. Tolerates sitting EOB without LOB. MIN A for STS transfer with RW and sits 2/2 increased pain with standing briefly. Pt left with PT at end of session. Currently pt demonstrates impairments as described below (See OT problem list) which functionally limit his ability to perform ADL/self-care tasks. Pt would benefit from skilled OT services to address noted impairments and functional limitations (see below for any additional details) in order to maximize safety and independence while minimizing falls risk and caregiver burden. Upon hospital discharge, recommend HHOT to maximize pt safety and return to functional independence during meaningful occupations of daily life. Also recommend HHA to support housekeeping and meal prep tasks initially.      Follow Up Recommendations  Home health OT;Other (comment) [facility])    Equipment Recommendations  None recommended by OT    Recommendations for Other Services       Precautions / Restrictions Precautions Precautions: Fall Restrictions Weight Bearing Restrictions: No      Mobility Bed Mobility Overal bed mobility: Needs Assistance Bed Mobility: Supine to Sit     Supine to sit: Supervision     General bed mobility comments: increased time/effort and pain    Transfers Overall transfer level: Needs assistance Equipment used: Rolling walker (2 wheeled) Transfers: Sit to/from Stand Sit to Stand: Min assist         General transfer comment: increased effort from std height bed, pain with effort    Balance Overall balance assessment: Needs assistance Sitting-balance support: Single extremity supported;Feet supported Sitting balance-Leahy Scale: Fair     Standing balance support: Bilateral upper extremity supported Standing balance-Leahy Scale: Fair                             ADL either performed or assessed with clinical judgement   ADL Overall ADL's : Needs assistance/impaired                                       General ADL Comments: Pt able to don and adjust socks on feet but with effort 2/2 pain, MIN A for ADL transfers with RW, anticipate PRN assist for LB ADL based on fluctuating pain     Vision Patient Visual Report: No  change from baseline       Perception     Praxis      Pertinent Vitals/Pain Pain Assessment: 0-10 Pain Score: 8  Pain Location: abdomen, across R/L ribcage and across his middle back Pain Descriptors / Indicators: Aching;Guarding Pain Intervention(s): Limited activity within patient's tolerance;Monitored during session;Premedicated before session     Hand Dominance Right   Extremity/Trunk Assessment Upper Extremity Assessment Upper Extremity Assessment: Overall WFL for tasks  assessed (grossly at least 4/5 bilat; pt does report intermittent LUE sharp/shooting pain and feeling like his arm is "falling asleep"; not currently experiencing at time of eval)   Lower Extremity Assessment Lower Extremity Assessment: Defer to PT evaluation;Generalized weakness   Cervical / Trunk Assessment Cervical / Trunk Assessment: Other exceptions Cervical / Trunk Exceptions: pain through mid back   Communication Communication Communication: No difficulties   Cognition Arousal/Alertness: Awake/alert Behavior During Therapy: WFL for tasks assessed/performed Overall Cognitive Status: No family/caregiver present to determine baseline cognitive functioning                                 General Comments: Alert and oriented to self, place, and generally to time. Difficulty with situation reporting he's having pain possibly due to previous Covid and PNA, follows commands well   General Comments       Exercises     Shoulder Instructions      Home Living Family/patient expects to be discharged to:: Private residence Living Arrangements: Spouse/significant other Available Help at Discharge: Family;Available 24 hours/day;Other (Comment) (spouse unable to provide physical assist- she uses a rollator for mobility, on home O2, and has low vision) Type of Home: House Home Access: Stairs to enter [employer] of Steps: 2 Entrance Stairs-Rails: None Home Layout: One level     Bathroom Shower/Tub: Occupational psychologist: Handicapped height     Home Equipment: Building services engineer Comments: wife uses a rollator      Prior Functioning/Environment Level of Independence: Independent        Comments: Pt reports independent with mobility (although endorses using furniture to steady himself, especially in the am), no falls, drives, and does majority of cleaning, meal prep due to wife's health; indep with ADL and med mgt        OT Problem  List: Pain;Decreased activity tolerance;Impaired balance (sitting and/or standing);Decreased knowledge of use of DME or AE      OT Treatment/Interventions: Self-care/ADL training;Therapeutic exercise;Therapeutic activities;Energy conservation;DME and/or AE instruction;Patient/family education;Balance training    OT Goals(Current goals can be found in the care plan section) Acute Rehab OT Goals Patient Stated Goal: to feel better and go back home OT Goal Formulation: With patient Time For Goal Achievement: 09/01/20 Potential to Achieve Goals: Good ADL Goals Pt Will Perform Lower Body Dressing: with modified independence;sit to/from stand Pt Will Transfer to Toilet: with modified independence;ambulating (LRAD for amb, elevated commode) Additional ADL Goal #1: Pt will verbalize plan to implement at least 1 learned pain mgt strategy to maximize safety/indep with ADL/IADL Additional ADL Goal #2: Pt will verbalize plan to implement at least 1 learned energy conservation strategy to maximize safety/indep with ADL/IADL  OT Frequency: Min 2X/week   Barriers to D/C: Decreased caregiver support          Co-evaluation              AM-PAC OT "6 Clicks" Daily Activity  Outcome Measure Help from another person eating meals?: None Help from another person taking care of personal grooming?: None Help from another person toileting, which includes using toliet, bedpan, or urinal?: A Little Help from another person bathing (including washing, rinsing, drying)?: A Little Help from another person to put on and taking off regular upper body clothing?: None Help from another person to put on and taking off regular lower body clothing?: A Little 6 Click Score: 21   End of Session Equipment Utilized During Treatment: Gait belt;Rolling walker  Activity Tolerance: No increased pain Patient left: in bed;with call bell/phone within reach;Other (comment) (seated EOB with PT)  OT Visit Diagnosis:  Other abnormalities of gait and mobility (R26.89);Pain Pain - part of body:  (abdominal, across rib cage bilat, and across mid back)                Time: 1517-6160 OT Time Calculation (min): 18 min Charges:  OT General Charges $OT Visit: 1 Visit OT Evaluation $OT Eval Moderate Complexity: 1 Mod  Hanley Hays, MPH, MS, OTR/L ascom [phone] 08/18/20, 11:13 AM

## 2020-08-18 NOTE — TOC Initial Note (Addendum)
Transition of Care [facility]) - Initial/Assessment Note    Patient Details  Name: Ricky Riley MRN: [id number] Date of Birth: Oct 23, 1940  Transition of Care [facility]) CM/SW Contact:    Magnus Ivan, LCSW Phone Number: 08/18/2020, 2:22 PM  Clinical Narrative:                Met with patient at bedside. Patient lives with his wife and usually drives himself to appointments. PCP is Dr. Wynetta Emery. [facility]. No DME, HH, or SNF history. Patient agreeable to recommendations for RW and HH. No agency preferences. Referral made to [facility] with [employer] for RW. Working on finding [place] coverage (PT, OT, RN, Aide), Corene Cornea with [facility] is reviewing referral. Patient was concerned about missing his appointment at the [place], [place] updated [place] RN of patient's concern.   Expected Discharge Plan: [place] Barriers to Discharge: Continued Medical Work up   Patient Goals and CMS Choice Patient states their goals for this hospitalization and ongoing recovery are:: home with home health CMS Medicare.gov Compare Post Acute Care list provided to:: Patient Choice offered to / list presented to : Patient  Expected Discharge Plan and Services Expected Discharge Plan: [place]       Living arrangements for the past 2 months: Single Family Home                 DME Arranged: Walker rolling DME Agency: [employer] Date DME Agency Contacted: 08/18/20   Representative spoke with at DME Agency: [place]: PT,OT,RN,Nurse's Aide          Prior Living Arrangements/Services Living arrangements for the past 2 months: [place] Lives with:: Spouse Patient language and need for interpreter reviewed:: Yes Do you feel safe going back to the place where you live?: Yes      Need for Family Participation in Patient Care: Yes (Comment) Care giver support system in place?: Yes (comment)   Criminal Activity/Legal Involvement  Pertinent to Current Situation/Hospitalization: No - Comment as needed  Activities of Daily Living Home Assistive Devices/Equipment: Shower chair with back ADL Screening (condition at time of admission) Patient's cognitive ability adequate to safely complete daily activities?: Yes Is the patient deaf or have difficulty hearing?: No Does the patient have difficulty seeing, even when wearing glasses/contacts?: No Does the patient have difficulty concentrating, remembering, or making decisions?: No Patient able to express need for assistance with ADLs?: Yes Does the patient have difficulty dressing or bathing?: No Independently performs ADLs?: Yes (appropriate for developmental age) Does the patient have difficulty walking or climbing stairs?: No Weakness of Legs: None Weakness of Arms/Hands: None  Permission Sought/Granted Permission sought to share information with : Chartered certified accountant granted to share information with : Yes, Verbal Permission Granted     Permission granted to share info w AGENCY: HH, DME agencies        Emotional Assessment       Orientation: : Oriented to Self,Oriented to Place,Oriented to  Time,Oriented to Situation Alcohol / Substance Use: Not Applicable Psych Involvement: No (comment)  Admission diagnosis:  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 Abdominal pain 08/17/2020  . Pancreatic mass 08/17/2020  . Lesion of liver 08/12/2020  . Pain due to onychomycosis of toenails of both feet 03/31/2020  . Diabetes mellitus without complication ([place]) 95/02/3266  . Lobar pneumonia ([place]) 12/13/2019  .  CAP (community acquired pneumonia) 12/12/2019  . Transaminitis 12/12/2019  . GERD (gastroesophageal reflux disease) 12/12/2019  . Acute renal failure superimposed on stage 3a chronic kidney disease ([place]) 12/12/2019  . Type II diabetes mellitus with renal manifestations  ([place]) 12/12/2019  . Left inguinal hernia 04/22/2019  . Closed fracture of body of sternum with delayed healing 04/30/2017  . SOB (shortness of breath) 03/22/2017  . Chest pain, musculoskeletal 02/05/2017  . Acute pain of right shoulder 01/11/2017  . Calf cramp 12/31/2016  . Right thyroid nodule 04/20/2016  . Primary osteoarthritis of right knee 03/30/2016  . Abnormal CT scan, neck 03/21/2016  . Hypercalcemia 03/12/2016  . Chronic cough 01/23/2016  . Current chronic use of systemic steroids 01/23/2016  . Headache 01/26/2015  . Heart palpitations 03/17/2014  . Hypertension 03/17/2014  . Sleep apnea 03/17/2014  . Male erectile dysfunction 01/15/2013  . 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 Prostatitis, chronic 01/14/2012   PCP:  Baxter Hire, MD [id number] [place] [address]: [phone]: [phone] [facility] [address]: [phone]: [phone]    Social Determinants of Health (SDOH) Interventions    Readmission Risk Interventions No flowsheet data found.

## 2020-08-18 NOTE — Progress Notes (Signed)
PROGRESS NOTE    Ricky Riley  GQQ:[id number] DOB: July 13, 1940 DOA: 08/17/2020 PCP: Baxter Hire, MD   Assessment & Plan:   Principal Problem:   Abdominal pain Active Problems:   Hypertension   Sleep apnea   Transaminitis   Acute renal failure superimposed on stage 3a chronic kidney disease ([place])   Type II diabetes mellitus with renal manifestations ([place])   Pancreatic mass   Abdominal pain: likely secondary to pancreatic mass with likely mets to liver vs constipation. Norco, morphine prn. 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 S/p liver biopsy on 08/16/20.   AKI on CKDIIIa: Cr is trending up from day prior. Avoid nephrotoxic meds  Transaminitis: likely secondary to liver mets   HTN: continue to hold losartan secondary to AKI   DM2: relatively well controlled w/ HbA1c 7.2. Continue on SSI w/ accuchecks   OSA: noncompliant w/ CPAP     DVT prophylaxis: lovenox Code Status: full  Family Communication: discussed pt's care w/ pt's wife, Onalee Hua, & her questions Disposition Plan: likely back home   Level of care: Med-Surg   Status is: Observation  The patient remains OBS appropriate and will d/c before 2 midnights.  Dispo: The patient is from: Home              Anticipated d/c is to: Home              Patient currently is not medically stable to d/c.   Difficult to place patient Yes        Consultants:      Procedures:    Antimicrobials:    Subjective: Pt c/o abd pain   Objective: Vitals:   08/17/20 1622 08/17/20 1930 08/17/20 2046 08/18/20 0429  BP: 108/66 (!) 143/76 129/69 105/70  Pulse:  78 (!) 104 86  Resp:  18 18 20   Temp:  97.9 F (36.6 C) 98.5 F (36.9 C) 97.7 F (36.5 C)  TempSrc:  Oral  Oral  SpO2:  94% 98% 94%  Weight:      Height:       No intake or output data in the 24 hours ending 08/18/20 6712 Filed Weights   08/17/20 1620  Weight: 83.9 kg    Examination:  General  exam: Appears calm and comfortable  Respiratory system: Clear to auscultation. Respiratory effort normal. Cardiovascular system: S1 & S2 +. No rubs, gallops or clicks Gastrointestinal system: Abdomen is nondistended, soft and nontender. Normal bowel sounds heard. Central nervous system: Alert and oriented. Moves all 4 extremities  Psychiatry: Judgement and insight appear normal. Flat mood and affect    Data Reviewed: I have personally reviewed following labs and imaging studies  CBC: Recent Labs  Lab 08/11/20 1913 08/14/20 0908 08/16/20 0847 08/17/20 1705 08/18/20 0507  WBC 8.1 7.9 8.8 9.4 9.4  HGB 12.8* 11.8* 12.0* 11.5* 11.8*  HCT 38.1* 36.3* 37.2* 35.4* 36.0*  MCV 79.9* 80.0 81.6 80.8 79.8*  PLT 177 180 183 175 458   Basic Metabolic Panel: Recent Labs  Lab 08/11/20 1913 08/12/20 0945 08/14/20 0908 08/17/20 1705 08/18/20 0507  NA 131* 132* 134* 134* 135  K 3.8 4.0 3.9 4.1 4.2  CL 99 96* 102 102 103  CO2 22 24 22 25 24   GLUCOSE 114* 163* 125* 151* 145*  BUN 17 23 19 21 22   CREATININE 1.20 1.41* 1.36* 1.39* 1.52*  CALCIUM 10.1 10.2 10.0 9.9 9.8   GFR: Estimated Creatinine Clearance: 48.3  mL/min (A) (by C-G formula based on SCr of 1.52 mg/dL (H)). Liver Function Tests: Recent Labs  Lab 08/11/20 1913 08/12/20 0945 08/14/20 0908 08/17/20 1705  AST 62* 58* 52* 78*  ALT 62* 56* 45* 51*  ALKPHOS 195* 180* 194* 209*  BILITOT 1.2 1.0 1.1 1.1  PROT 7.8 7.4 7.2 6.7  ALBUMIN 4.1 3.8 3.6 3.3*   Recent Labs  Lab 08/11/20 1913 08/14/20 0908 08/17/20 1705  LIPASE 27 26 22    No results for input(s): AMMONIA in the last 168 hours. Coagulation Profile: Recent Labs  Lab 08/12/20 0945 08/16/20 0847  INR 1.3* 1.2   Cardiac Enzymes: No results for input(s): CKTOTAL, CKMB, CKMBINDEX, TROPONINI in the last 168 hours. BNP (last 3 results) No results for input(s): PROBNP in the last 8760 hours. HbA1C: No results for input(s): HGBA1C in the last 72  hours. CBG: Recent Labs  Lab 08/16/20 0904 08/16/20 1036  GLUCAP 118* 105*   Lipid Profile: No results for input(s): CHOL, HDL, LDLCALC, TRIG, CHOLHDL, LDLDIRECT in the last 72 hours. Thyroid Function Tests: No results for input(s): TSH, T4TOTAL, FREET4, T3FREE, THYROIDAB in the last 72 hours. Anemia Panel: No results for input(s): VITAMINB12, FOLATE, FERRITIN, TIBC, IRON, RETICCTPCT in the last 72 hours. Sepsis Labs: No results for input(s): PROCALCITON, LATICACIDVEN in the last 168 hours.  Recent Results (from the past 240 hour(s))  SARS CORONAVIRUS 2 (TAT 6-24 HRS) Nasopharyngeal Nasopharyngeal Swab     Status: None   Collection Time: 08/17/20  6:53 PM   Specimen: Nasopharyngeal Swab  Result Value Ref Range Status   SARS Coronavirus 2 NEGATIVE NEGATIVE Final    Comment: (NOTE) SARS-CoV-2 target nucleic acids are NOT DETECTED.  The SARS-CoV-2 RNA is generally detectable in upper and lower respiratory specimens during the acute phase of infection. Negative results do not preclude SARS-CoV-2 infection, do not rule out co-infections with other pathogens, and should not be used as the sole basis for treatment or other patient management decisions. Negative results must be combined with clinical observations, patient history, and epidemiological information. The expected result is Negative.  Fact Sheet for Patients: SugarRoll.be  Fact Sheet for Healthcare Providers: https://www.woods-mathews.com/  This test is not yet approved or cleared by the [place] FDA and  has been authorized for detection and/or diagnosis of SARS-CoV-2 by FDA under an Emergency Use Authorization (EUA). This EUA will remain  in effect (meaning this test can be used) for the duration of the COVID-19 declaration under Se ction 564(b)(1) of the Act, 21 U.S.C. section 360bbb-3(b)(1), unless the authorization is terminated or revoked sooner.  Performed at  [facility], [place] [address], [place], [place] [address]          Radiology Studies: US BIOPSY (LIVER)  Result Date: 08/16/2020 INDICATION: Pancreatic mass and multiple liver lesions EXAM: Ultrasound-guided liver lesion biopsy MEDICATIONS: None. ANESTHESIA/SEDATION: Moderate (conscious) sedation was employed during this procedure. A total of Versed 1 mg and Fentanyl 50 mcg was administered intravenously. Moderate Sedation Time: The 15 minutes. The patient's level of consciousness and vital signs were monitored continuously by radiology nursing throughout the procedure under my direct supervision. COMPLICATIONS: None immediate. PROCEDURE: Informed written consent was obtained from the patient after a thorough discussion of the procedural risks, benefits and alternatives. All questions were addressed. Maximal Sterile Barrier Technique was utilized including caps, mask, sterile gowns, sterile gloves, sterile drape, hand hygiene and skin antiseptic. A timeout was performed prior to the initiation of the procedure. Patient position  left lateral decubitus on the ultrasound table. Right upper quadrant skin prepped and draped in usual sterile fashion. Following local lidocaine administration, 17 gauge introducer needle was advanced into 1 of the right liver lesions, and four 18 gauge cores were obtained utilizing continuous ultrasound guidance. Gelfoam slurry was administered through the introducer needle at the biopsy site. Samples were sent to pathology in formalin. Needle removed and hemostasis achieved with 5 minutes of manual compression. Post procedure ultrasound images showed no evidence of significant hemorrhage. IMPRESSION: Ultrasound-guided biopsy of right liver mass. Electronically Signed   By: Miachel Roux M.D.   On: 08/16/2020 10:34        Scheduled Meds: . aspirin EC  81 mg Oral Daily  . enoxaparin (LOVENOX) injection  40 mg Subcutaneous Q24H  . finasteride  5 mg Oral Daily  .  HYDROcodone-acetaminophen  1 tablet Oral Q4H  . pantoprazole  40 mg Oral Daily  . polyethylene glycol  17 g Oral Daily  . senna-docusate  1 tablet Oral BID   Continuous Infusions:   LOS: 0 days    Time spent: 34 mins     Wyvonnia Dusky, MD [facility] Pager [phone]  If 7PM-7AM, please contact night-coverage 08/18/2020, 7:22 AM

## 2020-08-18 NOTE — Consult Note (Signed)
[place]  Telephone:([phone] [phone]   Name: Ricky Riley Date: 08/18/2020 MRN: [id number]  DOB: 1940-07-23  Patient Care Team: Baxter Hire, MD as PCP - General (Internal Medicine) Cammie Sickle, MD as Consulting Physician (Internal Medicine)    REASON FOR CONSULTATION: Ricky Riley is a 80 y.o. 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  Patient was admitted to the hospital on 08/17/2020 with worsening abdominal pain.  Palliative care was consulted to help address goals and manage ongoing symptoms.   SOCIAL HISTORY:     reports that he quit smoking about 11 years ago. His smoking use included cigarettes. He smoked 1.00 pack per day. He has never used smokeless tobacco. He reports that he does not drink alcohol and does not use drugs.  Patient is married and lives at home with his wife.  Patient has a son and daughter who are involved in his care.  He is retired and previously worked as a Furniture conservator/restorer.  ADVANCE DIRECTIVES:  None on file  CODE STATUS: Full code  PAST MEDICAL HISTORY: Past Medical History:  Diagnosis Date  . Arthritis   . Cancer associated pain   . Diabetes mellitus without complication ([place])   . Dysrhythmia   . GERD (gastroesophageal reflux disease)   . Headache   . Hypertension   . Sleep apnea    CPAP     PAST SURGICAL HISTORY:  Past Surgical History:  Procedure Laterality Date  . ARTERY BIOPSY Left 03/02/2015   Procedure: BIOPSY TEMPORAL ARTERY;  Surgeon: Clyde Canterbury, MD;  Location: [facility];  Service: ENT;  Laterality: Left;  . HERNIA REPAIR    . LEFT HEART CATH AND CORONARY ANGIOGRAPHY Left 01/27/2020   Procedure: LEFT HEART CATH AND CORONARY ANGIOGRAPHY;  Surgeon: Teodoro Spray, MD;  Location: [place] [facility];  Service: Cardiovascular;  Laterality: Left;  . PENILE PROSTHESIS IMPLANT    . TRANSURETHRAL RESECTION OF PROSTATE    . XI ROBOTIC ASSISTED INGUINAL HERNIA REPAIR WITH MESH Left 04/22/2019   Procedure: XI ROBOTIC ASSISTED INGUINAL HERNIA REPAIR WITH MESH;  Surgeon: Herbert Pun, MD;  Location: [facility];  Service: General;  Laterality: Left;    HEMATOLOGY/ONCOLOGY HISTORY:  Oncology History   No history exists.    ALLERGIES:  is allergic to atorvastatin, flunisolide, lisinopril, rosuvastatin, ciprofloxacin, penicillins, sulfa antibiotics, and sulfasalazine.  MEDICATIONS:  Current Facility-Administered Medications  Medication Dose Route Frequency Provider Last Rate Last Admin  . aspirin EC tablet 81 mg  81 mg Oral Daily Tu, Ching T, DO   81 mg at 08/18/20 0749  . docusate sodium (COLACE) capsule 200 mg  200 mg Oral BID Wyvonnia Dusky, MD   200 mg at 08/18/20 1206  . enoxaparin (LOVENOX) injection 40 mg  40 mg Subcutaneous Q24H Tu, Ching T, DO   40 mg at 08/17/20 2101  . finasteride (PROSCAR) tablet 5 mg  5 mg Oral Daily Tu, Ching T, DO   5 mg at 08/18/20 0748  . HYDROcodone-acetaminophen (NORCO/VICODIN) 5-325 MG per tablet 1 tablet  1 tablet Oral Q4H PRN Wyvonnia Dusky, MD   1 tablet at 08/18/20 1624  . insulin aspart (novoLOG) injection 0-9 Units  0-9 Units Subcutaneous TID WC Wyvonnia Dusky, MD   1 Units at 08/18/20 1624  .  mirtazapine (REMERON) tablet 15 mg  15 mg Oral QHS PRN Tu, Ching T, DO      . morphine 2 MG/ML injection 1 mg  1 mg Intravenous Q3H PRN Tu, Ching T, DO      . ondansetron (ZOFRAN) injection 4 mg  4 mg Intravenous Q6H PRN Tu, Ching T, DO      . pantoprazole (PROTONIX) EC tablet 40 mg  40 mg Oral Daily Tu, Ching T, DO   40 mg at 08/18/20 0748  . polyethylene glycol (MIRALAX / GLYCOLAX) packet 17 g  17 g Oral Daily Tu, Ching T, DO   17 g at 08/18/20 0749  . senna-docusate (Senokot-S) tablet 1 tablet  1 tablet Oral BID Tu, Ching T,  DO   1 tablet at 08/18/20 4401   Facility-Administered Medications Ordered in Other Encounters  Medication Dose Route Frequency Provider Last Rate Last Admin  . 0.9 %  sodium chloride infusion  250 mL Intravenous PRN Teodoro Spray, MD      . 0.9% sodium chloride infusion  1 mL/kg/hr Intravenous Continuous Teodoro Spray, MD      . sodium chloride flush (NS) 0.9 % injection 3 mL  3 mL Intravenous Q12H Teodoro Spray, MD      . sodium chloride flush (NS) 0.9 % injection 3 mL  3 mL Intravenous PRN Teodoro Spray, MD        VITAL SIGNS: BP (!) 160/92 (BP Location: Right Arm)   Pulse 71   Temp 98.4 F (36.9 C)   Resp 18   Ht 6\' 3"  (1.905 m)   Wt 185 lb (83.9 kg)   SpO2 97%   BMI 23.12 kg/m  Filed Weights   08/17/20 1620  Weight: 185 lb (83.9 kg)    Estimated body mass index is 23.12 kg/m as calculated from the following:   Height as of this encounter: 6\' 3"  (1.905 m).   Weight as of this encounter: 185 lb (83.9 kg).  LABS: CBC:    Component Value Date/Time   WBC 9.4 08/18/2020 0507   HGB 11.8 (L) 08/18/2020 0507   HCT 36.0 (L) 08/18/2020 0507   PLT 193 08/18/2020 0507   MCV 79.8 (L) 08/18/2020 0507   NEUTROABS 4.7 12/12/2019 0836   LYMPHSABS 0.9 12/12/2019 0836   MONOABS 1.1 (H) 12/12/2019 0836   EOSABS 0.1 12/12/2019 0836   BASOSABS 0.0 12/12/2019 0836   Comprehensive Metabolic Panel:    Component Value Date/Time   NA 135 08/18/2020 0507   K 4.2 08/18/2020 0507   CL 103 08/18/2020 0507   CO2 24 08/18/2020 0507   BUN 22 08/18/2020 0507   CREATININE 1.52 (H) 08/18/2020 0507   GLUCOSE 145 (H) 08/18/2020 0507   CALCIUM 9.8 08/18/2020 0507   AST 78 (H) 08/17/2020 1705   ALT 51 (H) 08/17/2020 1705   ALKPHOS 209 (H) 08/17/2020 1705   BILITOT 1.1 08/17/2020 1705   PROT 6.7 08/17/2020 1705   ALBUMIN 3.3 (L) 08/17/2020 1705    RADIOGRAPHIC STUDIES: CT ABDOMEN PELVIS W CONTRAST  Result Date: 08/11/2020 CLINICAL DATA:  Bilateral flank pain EXAM: CT ABDOMEN AND  PELVIS WITH CONTRAST TECHNIQUE: Multidetector CT imaging of the abdomen and pelvis was performed using the standard protocol following bolus administration of intravenous contrast. CONTRAST:  134mL OMNIPAQUE IOHEXOL 300 MG/ML  SOLN COMPARISON:  CT 12/12/2019 FINDINGS: Lower chest: Lung bases demonstrate no acute consolidation or effusion. Probable scarring at the right middle lobe and right base. Borderline  cardiomegaly. No significant pericardial effusion. Hepatobiliary: Interim development of numerous hypodense hepatic masses concerning for metastatic disease. No calcified gallstone. No biliary dilatation. The gallbladder appears slightly thick walled. Pancreas: No inflammatory changes. Mild pancreatic ductal dilatation. Irregular hypodense mass at the tail of the pancreas measuring 5.8 by 3.1 by 3.4 cm. Spleen: Normal in size without focal abnormality. Adrenals/Urinary Tract: Adrenal glands are within normal limits. Kidneys show bilateral cysts. No hydronephrosis. The urinary bladder is unremarkable. Stomach/Bowel: The stomach is nonenlarged. No dilated small bowel. No acute bowel wall thickening. Negative appendix Vascular/Lymphatic: Mild aortic atherosclerosis. No aneurysmal dilatation. Poorly defined soft tissue density at the gastrohepatic ligament measuring approximately 3 x 1.5 cm concerning for metastatic disease. Periportal lymph nodes measuring up to 18 cm. 13 mm left retroperitoneal lymph node at the level of the SMA. Reproductive: Prostate unremarkable. Penile prosthesis with left pelvic deflated reservoir. Other: No free air. Small amount of abdominopelvic ascites. Small mesenteric nodules, for example 3 mm nodule anterior to the distal descending colon, series 2, image number 53. Multiple small soft tissue nodules in the right colic gutter. Multiple soft tissue nodules anterior to the left hepatic lobe measuring up to 17 mm, series 7, image number 17. Musculoskeletal: New lucent lesion with  peripheral sclerosis in the posterior left ilium, series 2, image number 66 indeterminate for metastatic disease. IMPRESSION: 1. Interim development of numerous hypodense hepatic masses concerning for metastatic disease. 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 2. Gastrohepatic, periportal and retroperitoneal adenopathy also concerning for metastatic disease. 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 3. New lucent lesion with peripheral sclerosis in the posterior left ilium, indeterminate for metastatic disease. Aortic Atherosclerosis (ICD10-I70.0). Electronically Signed   By: Donavan Foil M.D.   On: 08/11/2020 21:50   US BIOPSY (LIVER)  Result Date: 08/16/2020 INDICATION: Pancreatic mass and multiple liver lesions EXAM: Ultrasound-guided liver lesion biopsy MEDICATIONS: None. ANESTHESIA/SEDATION: Moderate (conscious) sedation was employed during this procedure. A total of Versed 1 mg and Fentanyl 50 mcg was administered intravenously. Moderate Sedation Time: The 15 minutes. The patient's level of consciousness and vital signs were monitored continuously by radiology nursing throughout the procedure under my direct supervision. COMPLICATIONS: None immediate. PROCEDURE: Informed written consent was obtained from the patient after a thorough discussion of the procedural risks, benefits and alternatives. All questions were addressed. Maximal Sterile Barrier Technique was utilized including caps, mask, sterile gowns, sterile gloves, sterile drape, hand hygiene and skin antiseptic. A timeout was performed prior to the initiation of the procedure. Patient position left lateral decubitus on the ultrasound table. Right upper quadrant skin prepped and draped in usual sterile fashion. Following local lidocaine administration, 17 gauge introducer needle was  advanced into 1 of the right liver lesions, and four 18 gauge cores were obtained utilizing continuous ultrasound guidance. Gelfoam slurry was administered through the introducer needle at the biopsy site. Samples were sent to pathology in formalin. Needle removed and hemostasis achieved with 5 minutes of manual compression. Post procedure ultrasound images showed no evidence of significant hemorrhage. IMPRESSION: Ultrasound-guided biopsy of right liver mass. Electronically Signed   By: Miachel Roux M.D.   On: 08/16/2020 10:34   DG Abd 2 Views  Result Date: 08/18/2020 CLINICAL DATA:  Constipation due to opiate therapy. EXAM: ABDOMEN - 2 VIEW COMPARISON:  CT 08/11/2020 FINDINGS: No free intra-abdominal air moderate stool in the ascending, transverse, proximal  descending colon. Small volume of stool in the distal descending and sigmoid colon. Enteric contrast from prior abdominal CT is present in the left colon. No small bowel dilatation or evidence of obstruction. 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 Linear right basilar atelectasis. IMPRESSION: Moderate stool in the colon. No evidence of bowel obstruction. Previous enteric contrast from prior abdominal CT is present in the left colon. Electronically Signed   By: Keith Rake M.D.   On: 08/18/2020 20:04    PERFORMANCE STATUS (ECOG) : 2 - Symptomatic, <50% confined to bed  Review of Systems Unless otherwise noted, a complete review of systems is negative.  Physical Exam General: NAD Cardiovascular: regular rate and rhythm Pulmonary: Unlabored Abdomen: soft, nontender, + bowel sounds Extremities: no edema, no joint deformities Skin: no rashes Neurological: Weakness but otherwise nonfocal  IMPRESSION: Symptomatically, patient endorses moderately severe generalized abdominal pain.  He describes pain as sharp and crampy.  He is passing gas.  However, patient has not had a bowel movement in 4 to 5 days.  Likely constipation is secondary to opioid  use.  At home, patient was taking Norco 5 325 mg every 4-6 hours.  He did not find that dose or frequency to be effective.  Therefore, patient says that he was also taking 1-2 tablets of his wife's Norco, which was a stronger dose (10-325mg ). Per RN, patient has reported some visual hallucinations after taking Norco in the hospital.  Since being admitted to the hospital, patient has taken a total of 30mg  hydrocodone and 8mg  IV morphine. Total oral MME is approximately 54 mg.  Patient says that the Norco is helpful in reducing his pain but is short-lived.  I question how much of patient's current pain might be related to constipation. I would recommend aggressive bowel regimen. I would recommend abdominal XR to rule out obstruction and then we could consider trial of an peripheral opioid receptor antagonist if patient fails to have a bowel movement with oral laxatives.  For his pain regimen, I would consider rotating to oxycodone for BTP. It may also be prudent to start patient on a long acting opioid such as transdermal fentanyl.   Patient says that his goals are aligned with the current scope of work-up.  He is interested in starting cancer treatment in an attempt to prolong life.  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  PLAN: -Continue current scope of treatment -Recommend abdominal XR to rule out obstruction -Aggressive bowel regimen -Consider rotating to oxycodone and starting patient on transdermal fentanyl 12.2mcg Q72H  Case and plan discussed with Dr. Rogue Bussing   Time Total: 60 minutes  Visit consisted of counseling and education dealing with the complex and emotionally intense issues of symptom management and palliative care in the setting of serious and potentially life-threatening illness.Greater than 50%  of this time was spent counseling and coordinating care related to the above assessment and plan.  Signed by: Altha Harm, PhD, NP-C

## 2020-08-18 NOTE — Evaluation (Addendum)
Physical Therapy Evaluation Patient Details Name: Ricky Riley MRN: [id number] DOB: 1941/03/14 Today's Date: 08/18/2020   History of Present Illness  Patient is a 80 y.o. 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 Patient complains of abdominal pain that worsens with functional activity. Patient was able to stand multiple times with minimal assistance but unable to progress ambulation due to pain. Patient reports using furniture for ambulation at home. Recommend rolling walker for safety and fall prevention with ambulation. Recommend also to continue PT to maximize independence and facilitate return to PLOF. Anticipate patient will be able to progress activity and be safe to return home when pain is better controlled. HHPT recommended at discharge.     Follow Up Recommendations Home health PT;Supervision for mobility/OOB    Equipment Recommendations  Rolling walker with 5" wheels    Recommendations for Other Services       Precautions / Restrictions Precautions Precautions: Fall Restrictions Weight Bearing Restrictions: No      Mobility  Bed Mobility Overal bed mobility: Needs Assistance Bed Mobility: Sit to Supine     Supine to sit: Supervision Sit to supine: Min guard   General bed mobility comments: increased time required to complete tasks. verbal cues for sequencing and technique    Transfers Overall transfer level: Needs assistance Equipment used: Rolling walker (2 wheeled) Transfers: Sit to/from Stand Sit to Stand: Min assist         General transfer comment: 2 standing bouts performed, with and without rolling walker. verbal cues for safety  Ambulation/Gait             General Gait Details:  deferred ambulation due to increase in abdominal pain reported with standing activity  Stairs            Wheelchair Mobility    Modified Rankin (Stroke Patients Only)       Balance Overall balance assessment: Needs assistance Sitting-balance support: Single extremity supported;Feet supported Sitting balance-Leahy Scale: Fair     Standing balance support: Bilateral upper extremity supported Standing balance-Leahy Scale: Fair Standing balance comment: patient using rolling walker for support in standing position                             Pertinent Vitals/Pain Pain Assessment: 0-10 Pain Score: 8  Pain Location: lower abdomen Pain Descriptors / Indicators: Aching Pain Intervention(s): Limited activity within patient's tolerance;Monitored during session    [place] expects to be discharged to:: Private residence Living Arrangements: Spouse/significant other Available Help at Discharge: Family;Available 24 hours/day;Other (Comment) Type of Home: House Home Access: Stairs to enter Entrance Stairs-Rails: None Entrance Stairs-Number of Steps: 2 Home Layout: One level Home Equipment: Electronics engineer Comments: wife uses a rollator    Prior Function Level of Independence: Independent         Comments: no assistive device used with ambulation, however patient does report using furniture to steady himself if needed     Hand Dominance   Dominant Hand: Right    Extremity/Trunk Assessment   Upper Extremity Assessment Upper Extremity Assessment: Defer to OT evaluation    Lower Extremity Assessment Lower Extremity Assessment: Generalized weakness    Cervical / Trunk Assessment Cervical / Trunk Assessment: Other exceptions Cervical /  Trunk Exceptions: pain through mid back  Communication   Communication: No difficulties  Cognition Arousal/Alertness: Awake/alert Behavior During Therapy: WFL for tasks  assessed/performed Overall Cognitive Status: No family/caregiver present to determine baseline cognitive functioning                                 General Comments: patient able to follow single step commands without difficulty      General Comments      Exercises     Assessment/Plan    PT Assessment Patient needs continued PT services  PT Problem List Decreased strength;Decreased activity tolerance;Decreased balance;Decreased mobility;Decreased knowledge of use of DME;Decreased safety awareness;Decreased knowledge of precautions       PT Treatment Interventions DME instruction;Gait training;Stair training;Functional mobility training;Therapeutic activities;Therapeutic exercise;Balance training;Neuromuscular re-education;Patient/family education    PT Goals (Current goals can be found in the Care Plan section)  Acute Rehab PT Goals Patient Stated Goal: have less pain and more strength PT Goal Formulation: With patient Time For Goal Achievement: 09/01/20 Potential to Achieve Goals: Good    Frequency Min 2X/week   Barriers to discharge        Co-evaluation               AM-PAC PT "6 Clicks" Mobility  Outcome Measure Help needed turning from your back to your side while in a flat bed without using bedrails?: A Little Help needed moving from lying on your back to sitting on the side of a flat bed without using bedrails?: A Little Help needed moving to and from a bed to a chair (including a wheelchair)?: A Little Help needed standing up from a chair using your arms (e.g., wheelchair or bedside chair)?: A Little Help needed to walk in hospital room?: A Lot Help needed climbing 3-5 steps with a railing? : A Lot 6 Click Score: 16    End of Session   Activity Tolerance: Patient limited by pain Patient left: in bed;with call bell/phone within reach;with bed alarm set Nurse Communication: Mobility status PT Visit Diagnosis: Unsteadiness on feet  (R26.81);Muscle weakness (generalized) (M62.81)    Time: 0569-7948 PT Time Calculation (min) (ACUTE ONLY): 18 min   Charges:   PT Evaluation $PT Eval Moderate Complexity: 1 Mod PT Treatments $Therapeutic Activity: 8-22 mins        Minna Merritts, PT, MPT   Percell Locus 08/18/2020, 1:14 PM

## 2020-08-19 ENCOUNTER — Other Ambulatory Visit: Payer: Self-pay | Admitting: Internal Medicine

## 2020-08-19 ENCOUNTER — Telehealth: Payer: Self-pay | Admitting: Internal Medicine

## 2020-08-19 ENCOUNTER — Inpatient Hospital Stay: Payer: Medicare Other | Admitting: Internal Medicine

## 2020-08-19 DIAGNOSIS — Z7984 Long term (current) use of oral hypoglycemic drugs: Secondary | ICD-10-CM | POA: Diagnosis not present

## 2020-08-19 DIAGNOSIS — Z881 Allergy status to other antibiotic agents status: Secondary | ICD-10-CM | POA: Diagnosis not present

## 2020-08-19 DIAGNOSIS — Z7982 Long term (current) use of aspirin: Secondary | ICD-10-CM | POA: Diagnosis not present

## 2020-08-19 DIAGNOSIS — T402X5A Adverse effect of other opioids, initial encounter: Secondary | ICD-10-CM

## 2020-08-19 DIAGNOSIS — Z79899 Other long term (current) drug therapy: Secondary | ICD-10-CM | POA: Diagnosis not present

## 2020-08-19 DIAGNOSIS — C787 Secondary malignant neoplasm of liver and intrahepatic bile duct: Secondary | ICD-10-CM | POA: Diagnosis present

## 2020-08-19 DIAGNOSIS — K8689 Other specified diseases of pancreas: Secondary | ICD-10-CM | POA: Diagnosis not present

## 2020-08-19 DIAGNOSIS — Z833 Family history of diabetes mellitus: Secondary | ICD-10-CM | POA: Diagnosis not present

## 2020-08-19 DIAGNOSIS — C252 Malignant neoplasm of tail of pancreas: Secondary | ICD-10-CM | POA: Diagnosis not present

## 2020-08-19 DIAGNOSIS — G4733 Obstructive sleep apnea (adult) (pediatric): Secondary | ICD-10-CM | POA: Diagnosis present

## 2020-08-19 DIAGNOSIS — Z7189 Other specified counseling: Secondary | ICD-10-CM

## 2020-08-19 DIAGNOSIS — Z880 Allergy status to penicillin: Secondary | ICD-10-CM | POA: Diagnosis not present

## 2020-08-19 DIAGNOSIS — N1831 Chronic kidney disease, stage 3a: Secondary | ICD-10-CM | POA: Diagnosis present

## 2020-08-19 DIAGNOSIS — N401 Enlarged prostate with lower urinary tract symptoms: Secondary | ICD-10-CM | POA: Diagnosis present

## 2020-08-19 DIAGNOSIS — I129 Hypertensive chronic kidney disease with stage 1 through stage 4 chronic kidney disease, or unspecified chronic kidney disease: Secondary | ICD-10-CM | POA: Diagnosis present

## 2020-08-19 DIAGNOSIS — K5903 Drug induced constipation: Secondary | ICD-10-CM | POA: Diagnosis not present

## 2020-08-19 DIAGNOSIS — N179 Acute kidney failure, unspecified: Secondary | ICD-10-CM | POA: Diagnosis present

## 2020-08-19 DIAGNOSIS — R338 Other retention of urine: Secondary | ICD-10-CM | POA: Diagnosis present

## 2020-08-19 DIAGNOSIS — Z882 Allergy status to sulfonamides status: Secondary | ICD-10-CM | POA: Diagnosis not present

## 2020-08-19 DIAGNOSIS — Z20822 Contact with and (suspected) exposure to covid-19: Secondary | ICD-10-CM | POA: Diagnosis present

## 2020-08-19 DIAGNOSIS — K219 Gastro-esophageal reflux disease without esophagitis: Secondary | ICD-10-CM | POA: Diagnosis present

## 2020-08-19 DIAGNOSIS — Z9119 Patient's noncompliance with other medical treatment and regimen: Secondary | ICD-10-CM | POA: Diagnosis not present

## 2020-08-19 DIAGNOSIS — K769 Liver disease, unspecified: Secondary | ICD-10-CM

## 2020-08-19 DIAGNOSIS — Z8249 Family history of ischemic heart disease and other diseases of the circulatory system: Secondary | ICD-10-CM | POA: Diagnosis not present

## 2020-08-19 DIAGNOSIS — G893 Neoplasm related pain (acute) (chronic): Secondary | ICD-10-CM | POA: Diagnosis present

## 2020-08-19 DIAGNOSIS — Z888 Allergy status to other drugs, medicaments and biological substances status: Secondary | ICD-10-CM | POA: Diagnosis not present

## 2020-08-19 DIAGNOSIS — C259 Malignant neoplasm of pancreas, unspecified: Secondary | ICD-10-CM | POA: Diagnosis present

## 2020-08-19 DIAGNOSIS — E1122 Type 2 diabetes mellitus with diabetic chronic kidney disease: Secondary | ICD-10-CM | POA: Diagnosis present

## 2020-08-19 DIAGNOSIS — E871 Hypo-osmolality and hyponatremia: Secondary | ICD-10-CM | POA: Diagnosis not present

## 2020-08-19 DIAGNOSIS — Z515 Encounter for palliative care: Secondary | ICD-10-CM | POA: Diagnosis not present

## 2020-08-19 DIAGNOSIS — R1084 Generalized abdominal pain: Secondary | ICD-10-CM | POA: Diagnosis not present

## 2020-08-19 LAB — CBC
HCT: 36.9 % — ABNORMAL LOW (ref 39.0–52.0)
Hemoglobin: 12.1 g/dL — ABNORMAL LOW (ref 13.0–17.0)
MCH: 26 pg (ref 26.0–34.0)
MCHC: 32.8 g/dL (ref 30.0–36.0)
MCV: 79.4 fL — ABNORMAL LOW (ref 80.0–100.0)
Platelets: 198 10*3/uL (ref 150–400)
RBC: 4.65 MIL/uL (ref 4.22–5.81)
RDW: 13.2 % (ref 11.5–15.5)
WBC: 11.2 10*3/uL — ABNORMAL HIGH (ref 4.0–10.5)
nRBC: 0 % (ref 0.0–0.2)

## 2020-08-19 LAB — COMPREHENSIVE METABOLIC PANEL
ALT: 41 U/L (ref 0–44)
AST: 49 U/L — ABNORMAL HIGH (ref 15–41)
Albumin: 3 g/dL — ABNORMAL LOW (ref 3.5–5.0)
Alkaline Phosphatase: 194 U/L — ABNORMAL HIGH (ref 38–126)
Anion gap: 8 (ref 5–15)
BUN: 23 mg/dL (ref 8–23)
CO2: 22 mmol/L (ref 22–32)
Calcium: 10 mg/dL (ref 8.9–10.3)
Chloride: 102 mmol/L (ref 98–111)
Creatinine, Ser: 1.52 mg/dL — ABNORMAL HIGH (ref 0.61–1.24)
GFR, Estimated: 47 mL/min — ABNORMAL LOW (ref >60)
Glucose, Bld: 120 mg/dL — ABNORMAL HIGH (ref 70–99)
Potassium: 4.4 mmol/L (ref 3.5–5.1)
Sodium: 132 mmol/L — ABNORMAL LOW (ref 135–145)
Total Bilirubin: 1.5 mg/dL — ABNORMAL HIGH (ref 0.3–1.2)
Total Protein: 6.3 g/dL — ABNORMAL LOW (ref 6.5–8.1)

## 2020-08-19 LAB — GLUCOSE, CAPILLARY
Glucose-Capillary: 126 mg/dL — ABNORMAL HIGH (ref 70–99)
Glucose-Capillary: 138 mg/dL — ABNORMAL HIGH (ref 70–99)
Glucose-Capillary: 113 mg/dL — ABNORMAL HIGH (ref 70–99)
Glucose-Capillary: 124 mg/dL — ABNORMAL HIGH (ref 70–99)

## 2020-08-19 MED ORDER — OLANZAPINE 5 MG PO TABS
5.0000 mg | ORAL_TABLET | Freq: Every evening | ORAL | Status: DC | PRN
  Filled 2020-08-19: qty 1

## 2020-08-19 MED ORDER — LACTULOSE 10 GM/15ML PO SOLN
30.0000 g | Freq: Two times a day (BID) | ORAL | Status: DC
  Administered 2020-08-19 – 2020-08-20 (×3): 30 g via ORAL
  Filled 2020-08-19 (×3): qty 60

## 2020-08-19 MED ORDER — OXYCODONE HCL 5 MG PO TABS
5.0000 mg | ORAL_TABLET | ORAL | Status: DC | PRN
  Administered 2020-08-19 – 2020-08-20 (×2): 5 mg via ORAL
  Filled 2020-08-19 (×2): qty 1

## 2020-08-19 NOTE — Consult Note (Signed)
[name] NOTE  Patient Care Team: Baxter Hire, MD as PCP - General (Internal Medicine) Cammie Sickle, MD as Consulting Physician (Internal Medicine)  CHIEF COMPLAINTS/PURPOSE OF CONSULTATION: Liver metastases/pancreatic cancer  HISTORY OF PRESENTING ILLNESS:  Ricky Riley 80 y.o.  male history of type 2 diabetes CKD recent diagnosis of pancreatic mass liver lesions is currently admitted hospital for worsening abdominal pain.  Of note patient was recently evaluated in the emergency room found to have a pancreatic mass with multiple liver lesions.  Patient underwent a liver biopsy earlier in the week and is currently awaiting to review the results.  In the interim patient noted to have worsening abdominal pain.  Patient has been constipated with last bowel movement approximately 4 days ago prior to admission.  Patient has been taking hydrocodone 5 mg every 4 hours.  Positive for nausea no vomiting.  No fevers.  Decreased appetite.  Generalized weakness the last few weeks.  Overall patient feels poorly.   Review of Systems  Constitutional: Positive for malaise/fatigue and weight loss. Negative for chills, diaphoresis and fever.  HENT: Negative for nosebleeds and sore throat.   Eyes: Negative for double vision.  Respiratory: Negative for cough, hemoptysis, sputum production, shortness of breath and wheezing.   Cardiovascular: Negative for chest pain, palpitations, orthopnea and leg swelling.  Gastrointestinal: Positive for abdominal pain and constipation. Negative for blood in stool, diarrhea, heartburn, melena, nausea and vomiting.  Genitourinary: Negative for dysuria, frequency and urgency.  Musculoskeletal: Negative for back pain and joint pain.  Skin: Negative.  Negative for itching and rash.  Neurological: Positive for weakness. Negative for dizziness, tingling, focal weakness and headaches.  Endo/Heme/Allergies: Does not bruise/bleed easily.   Psychiatric/Behavioral: Negative for depression. The patient is not nervous/anxious and does not have insomnia.      MEDICAL HISTORY:  Past Medical History:  Diagnosis Date  . Arthritis   . Cancer associated pain   . Diabetes mellitus without complication ([place])   . Dysrhythmia   . GERD (gastroesophageal reflux disease)   . Headache   . Hypertension   . Sleep apnea    CPAP     SURGICAL HISTORY: Past Surgical History:  Procedure Laterality Date  . ARTERY BIOPSY Left 03/02/2015   Procedure: BIOPSY TEMPORAL ARTERY;  Surgeon: Clyde Canterbury, MD;  Location: [facility];  Service: ENT;  Laterality: Left;  . HERNIA REPAIR    . LEFT HEART CATH AND CORONARY ANGIOGRAPHY Left 01/27/2020   Procedure: LEFT HEART CATH AND CORONARY ANGIOGRAPHY;  Surgeon: Teodoro Spray, MD;  Location: [facility];  Service: Cardiovascular;  Laterality: Left;  . PENILE PROSTHESIS IMPLANT    . TRANSURETHRAL RESECTION OF PROSTATE    . XI ROBOTIC ASSISTED INGUINAL HERNIA REPAIR WITH MESH Left 04/22/2019   Procedure: XI ROBOTIC ASSISTED INGUINAL HERNIA REPAIR WITH MESH;  Surgeon: Herbert Pun, MD;  Location: [facility];  Service: General;  Laterality: Left;    SOCIAL HISTORY: Social History   Socioeconomic History  . Marital status: Married    Spouse name: Not on file  . Number of children: Not on file  . Years of education: Not on file  . Highest education level: Not on file  Occupational History  . Not on file  Tobacco Use  . Smoking status: Former Smoker    Packs/day: 1.00    Types: Cigarettes    Quit date: 06/05/2009    Years since quitting: 11.2  . Smokeless tobacco: Never Used  Vaping Use  . Vaping Use: Never used  Substance and Sexual Activity  . Alcohol use: No  . Drug use: No  . Sexual activity: Not on file  Other Topics Concern  . Not on file  Social History Narrative   Lives in [place] with wife.  He is his wife's caregiver. Daughter-[place]; son- . Used to  work in Social worker jobs. Quit smoking in 2012. No alcohol.     Social Determinants of Health   Financial Resource Strain: Not on file  Food Insecurity: Not on file  Transportation Needs: Not on file  Physical Activity: Not on file  Stress: Not on file  Social Connections: Not on file  Intimate Partner Violence: Not on file    FAMILY HISTORY: Family History  Problem Relation Age of Onset  . Diabetes Mother   . Heart disease Mother   . Diabetes Father   . Heart disease Father   . Cancer Father 80  . Diabetes Sister   . Diabetes Brother   . Kidney failure Brother     ALLERGIES:  is allergic to atorvastatin, flunisolide, lisinopril, rosuvastatin, ciprofloxacin, penicillins, sulfa antibiotics, and sulfasalazine.  MEDICATIONS:  Current Facility-Administered Medications  Medication Dose Route Frequency Provider Last Rate Last Admin  . aspirin EC tablet 81 mg  81 mg Oral Daily Tu, Ching T, DO   81 mg at 08/19/20 0901  . docusate sodium (COLACE) capsule 200 mg  200 mg Oral BID Wyvonnia Dusky, MD   200 mg at 08/19/20 2105  . enoxaparin (LOVENOX) injection 40 mg  40 mg Subcutaneous Q24H Tu, Ching T, DO   40 mg at 08/19/20 2106  . finasteride (PROSCAR) tablet 5 mg  5 mg Oral Daily Tu, Ching T, DO   5 mg at 08/19/20 0901  . insulin aspart (novoLOG) injection 0-9 Units  0-9 Units Subcutaneous TID WC Wyvonnia Dusky, MD   1 Units at 08/19/20 1156  . lactulose (CHRONULAC) 10 GM/15ML solution 30 g  30 g Oral BID Wyvonnia Dusky, MD   30 g at 08/19/20 2106  . mirtazapine (REMERON) tablet 15 mg  15 mg Oral QHS PRN Tu, Ching T, DO   15 mg at 08/18/20 2015  . morphine 2 MG/ML injection 1 mg  1 mg Intravenous Q3H PRN Tu, Ching T, DO   1 mg at 08/19/20 0058  . OLANZapine (ZYPREXA) tablet 5 mg  5 mg Oral QHS PRN Borders, Kirt Boys, NP      . ondansetron (ZOFRAN) injection 4 mg  4 mg Intravenous Q6H PRN Tu, Ching T, DO   4 mg at 08/19/20 0058  . oxyCODONE (Oxy IR/ROXICODONE) immediate  release tablet 5 mg  5 mg Oral Q4H PRN Borders, Kirt Boys, NP   5 mg at 08/19/20 2054  . pantoprazole (PROTONIX) EC tablet 40 mg  40 mg Oral Daily Tu, Ching T, DO   40 mg at 08/19/20 0901  . polyethylene glycol (MIRALAX / GLYCOLAX) packet 17 g  17 g Oral Daily Tu, Ching T, DO   17 g at 08/19/20 0901  . senna-docusate (Senokot-S) tablet 1 tablet  1 tablet Oral BID Tu, Ching T, DO   1 tablet at 08/19/20 2106   Facility-Administered Medications Ordered in Other Encounters  Medication Dose Route Frequency Provider Last Rate Last Admin  . 0.9 %  sodium chloride infusion  250 mL Intravenous PRN Teodoro Spray, MD      . 0.9% sodium chloride infusion  1  mL/kg/hr Intravenous Continuous Teodoro Spray, MD      . sodium chloride flush (NS) 0.9 % injection 3 mL  3 mL Intravenous Q12H Bartholome Bill A, MD      . sodium chloride flush (NS) 0.9 % injection 3 mL  3 mL Intravenous PRN Teodoro Spray, MD          .  PHYSICAL EXAMINATION:  Vitals:   08/19/20 1656 08/19/20 2014  BP: (!) 152/91 (!) 161/97  Pulse: 91 82  Resp:  16  Temp: 98.8 F (37.1 C) 98.5 F (36.9 C)  SpO2: 95% 97%   Filed Weights   08/17/20 1620  Weight: 185 lb (83.9 kg)    Physical Exam Constitutional:      Comments: Patient resting comfortably.  He is accompanied by his wife and daughter.  HENT:     Head: Normocephalic and atraumatic.     Mouth/Throat:     Pharynx: No oropharyngeal exudate.  Eyes:     Pupils: Pupils are equal, round, and reactive to light.  Cardiovascular:     Rate and Rhythm: Normal rate and regular rhythm.  Pulmonary:     Effort: No respiratory distress.     Breath sounds: No wheezing.  Abdominal:     General: Bowel sounds are normal. There is no distension.     Palpations: Abdomen is soft. There is no mass.     Tenderness: There is no abdominal tenderness. There is no guarding or rebound.     Comments: Mild abdominal tenderness.  No rigidity or guarding.  Musculoskeletal:        General: No  tenderness. Normal range of motion.     Cervical back: Normal range of motion and neck supple.  Skin:    General: Skin is warm.  Neurological:     Mental Status: He is alert and oriented to person, place, and time.  Psychiatric:        Mood and Affect: Affect normal.      LABORATORY DATA:  I have reviewed the data as listed Lab Results  Component Value Date   WBC 11.2 (H) 08/19/2020   HGB 12.1 (L) 08/19/2020   HCT 36.9 (L) 08/19/2020   MCV 79.4 (L) 08/19/2020   PLT 198 08/19/2020   Recent Labs    12/12/19 0836 12/13/19 0416 12/14/19 0641 08/11/20 1913 08/14/20 0908 08/17/20 1705 08/18/20 0507 08/19/20 0546  NA 131* 135 134*   < > 134* 134* 135 132*  K 3.6 4.1 4.2   < > 3.9 4.1 4.2 4.4  CL 99 105 105   < > 102 102 103 102  CO2 22 23 23    < > 22 25 24 22   GLUCOSE 143* 109* 104*   < > 125* 151* 145* 120*  BUN 24* 21 20   < > 19 21 22 23   CREATININE 1.59* 1.37* 1.22   < > 1.36* 1.39* 1.52* 1.52*  CALCIUM 9.8 9.2 9.5   < > 10.0 9.9 9.8 10.0  GFRNONAA 41* 49* 57*   < > 54* 52* 47* 47*  GFRAA 48* 57* >60  --   --   --   --   --   PROT 7.6  --   --    < > 7.2 6.7  --  6.3*  ALBUMIN 3.6  --   --    < > 3.6 3.3*  --  3.0*  AST 17  --   --    < >  52* 78*  --  49*  ALT 11  --   --    < > 45* 51*  --  41  ALKPHOS 51  --   --    < > 194* 209*  --  194*  BILITOT 0.7  --   --    < > 1.1 1.1  --  1.5*   < > = values in this interval not displayed.    RADIOGRAPHIC STUDIES: I have personally reviewed the radiological images as listed and agreed with the findings in the report. CT ABDOMEN PELVIS W CONTRAST  Result Date: 08/11/2020 CLINICAL DATA:  Bilateral flank pain EXAM: CT ABDOMEN AND PELVIS WITH CONTRAST TECHNIQUE: Multidetector CT imaging of the abdomen and pelvis was performed using the standard protocol following bolus administration of intravenous contrast. CONTRAST:  147mL OMNIPAQUE IOHEXOL 300 MG/ML  SOLN COMPARISON:  CT 12/12/2019 FINDINGS: Lower chest: Lung bases  demonstrate no acute consolidation or effusion. Probable scarring at the right middle lobe and right base. Borderline cardiomegaly. No significant pericardial effusion. Hepatobiliary: Interim development of numerous hypodense hepatic masses concerning for metastatic disease. No calcified gallstone. No biliary dilatation. The gallbladder appears slightly thick walled. Pancreas: No inflammatory changes. Mild pancreatic ductal dilatation. Irregular hypodense mass at the tail of the pancreas measuring 5.8 by 3.1 by 3.4 cm. Spleen: Normal in size without focal abnormality. Adrenals/Urinary Tract: Adrenal glands are within normal limits. Kidneys show bilateral cysts. No hydronephrosis. The urinary bladder is unremarkable. Stomach/Bowel: The stomach is nonenlarged. No dilated small bowel. No acute bowel wall thickening. Negative appendix Vascular/Lymphatic: Mild aortic atherosclerosis. No aneurysmal dilatation. Poorly defined soft tissue density at the gastrohepatic ligament measuring approximately 3 x 1.5 cm concerning for metastatic disease. Periportal lymph nodes measuring up to 18 cm. 13 mm left retroperitoneal lymph node at the level of the SMA. Reproductive: Prostate unremarkable. Penile prosthesis with left pelvic deflated reservoir. Other: No free air. Small amount of abdominopelvic ascites. Small mesenteric nodules, for example 3 mm nodule anterior to the distal descending colon, series 2, image number 53. Multiple small soft tissue nodules in the right colic gutter. Multiple soft tissue nodules anterior to the left hepatic lobe measuring up to 17 mm, series 7, image number 17. Musculoskeletal: New lucent lesion with peripheral sclerosis in the posterior left ilium, series 2, image number 10 indeterminate for metastatic disease. IMPRESSION: 1. Interim development of numerous hypodense hepatic masses concerning for metastatic disease. Irregular hypodense mass at the tail of the pancreas measuring up to 5.8 cm,  concerning for primary pancreatic neoplasm. 2. Gastrohepatic, periportal and retroperitoneal adenopathy also concerning for metastatic disease. 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 3. New lucent lesion with peripheral sclerosis in the posterior left ilium, indeterminate for metastatic disease. Aortic Atherosclerosis (ICD10-I70.0). Electronically Signed   By: Donavan Foil M.D.   On: 08/11/2020 21:50   US BIOPSY (LIVER)  Result Date: 08/16/2020 INDICATION: Pancreatic mass and multiple liver lesions EXAM: Ultrasound-guided liver lesion biopsy MEDICATIONS: None. ANESTHESIA/SEDATION: Moderate (conscious) sedation was employed during this procedure. A total of Versed 1 mg and Fentanyl 50 mcg was administered intravenously. Moderate Sedation Time: The 15 minutes. The patient's level of consciousness and vital signs were monitored continuously by radiology nursing throughout the procedure under my direct supervision. COMPLICATIONS: None immediate. PROCEDURE: Informed written consent was obtained from the patient after a thorough discussion of the procedural risks, benefits and alternatives. All questions were addressed. Maximal Sterile Barrier Technique was  utilized including caps, mask, sterile gowns, sterile gloves, sterile drape, hand hygiene and skin antiseptic. A timeout was performed prior to the initiation of the procedure. Patient position left lateral decubitus on the ultrasound table. Right upper quadrant skin prepped and draped in usual sterile fashion. Following local lidocaine administration, 17 gauge introducer needle was advanced into 1 of the right liver lesions, and four 18 gauge cores were obtained utilizing continuous ultrasound guidance. Gelfoam slurry was administered through the introducer needle at the biopsy site. Samples were sent to pathology in formalin. Needle removed and hemostasis achieved with 5 minutes  of manual compression. Post procedure ultrasound images showed no evidence of significant hemorrhage. IMPRESSION: Ultrasound-guided biopsy of right liver mass. Electronically Signed   By: Miachel Roux M.D.   On: 08/16/2020 10:34   DG Abd 2 Views  Result Date: 08/18/2020 CLINICAL DATA:  Constipation due to opiate therapy. EXAM: ABDOMEN - 2 VIEW COMPARISON:  CT 08/11/2020 FINDINGS: No free intra-abdominal air moderate stool in the ascending, transverse, proximal descending colon. Small volume of stool in the distal descending and sigmoid colon. Enteric contrast from prior abdominal CT is present in the left colon. No small bowel dilatation or evidence of obstruction. 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 Linear right basilar atelectasis. IMPRESSION: Moderate stool in the colon. No evidence of bowel obstruction. Previous enteric contrast from prior abdominal CT is present in the left colon. Electronically Signed   By: Keith Rake M.D.   On: 08/18/2020 20:04    Primary cancer of tail of pancreas [facility]) #80 year old male patient with recently diagnosed multiple liver lesions concerning for malignancy; pancreatic tail mass is currently admitted to hospital for abdominal pain/constipation.  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  Tumor marker CA 19-9 >20,000.   #Abdominal pain-multifactorial; predominant constipation/context of liver metastases.  #Constipation likely narcotic induced-s/p bowel movement this morning.  #Elevated LFTs-likely secondary to underlying liver disease/malignancy.  No evidence of any obstruction noted.   #Delirium-likely secondary to narcotics/sundowning.  No significant clinical concerns for metastatic disease to the brain.  #Recommendations:  #I had a long discussion the patient/wife and daughter-regarding the serious diagnosis/and guarded prognosis.  Discussed that with systemic chemotherapy life expectancy is in the order of 8 to 12 months.   Discussed option of chemotherapy-gemcitabine Abraxane-response rates of about 25%. Without any chemotherapy in the order of few months.  Hospice would be recommended if patient declined chemotherapy. Discussed the potential side effects including but not limited to-increasing fatigue, nausea vomiting, diarrhea, hair loss, sores in the mouth, increase risk of infection and also neuropathy.    # At the end of the discussion patient/family were leaning towards proceeding with chemotherapy.  I would recommend starting chemotherapy next week-given overall patient's borderline performance status.  They understand patient is at high risk of complications given his age/borderline performance status/and social situation.  #The above plan of care was discussed with [facility] palliative care.  Also discussed with patient/wife and daughter.  Thank you Dr. Jimmye Norman for allowing me to participate in the care of your pleasant patient. Please do not hesitate to contact me with questions or concerns in the interim.   All questions were answered. The patient knows to call the clinic with any problems, questions or concerns.     Cammie Sickle, MD 08/19/2020 11:04 PM

## 2020-08-19 NOTE — Telephone Encounter (Signed)
Pt likely to be discharged over the weekend.   Please schedule appt- on 3/16- MD; labs- cbc/cmp; gem-abraxane [new].   Also, appt with Josh on 3/16.   Will need chemo ed ASAP; order port.   Thanks, GB

## 2020-08-19 NOTE — Progress Notes (Signed)
Pt. A/Ox3-4 can verbally respond with clear speech and ambulatory up ad lib. Continent of B/B. Skin warm, dry, and intact with no new s/s of impairment noted. Tolerated all medication well and compliance with regimen. Pt was medicated x2 with prn pain medication Norco/Vicodin 5-325mg  at 20:15, effective within one hour for a short time frame. Pt was noted to exhibit confusion after being  medicated with Norco/Vicodin. Medicated at 00:58 with morphine 1 mg, effective for a hour and pt continue to exhibit confusion. Pt was A/Ox1 to self,  disoriented to place, time, and situation. Pt was looking for wife, out of bed dressed himself, and ready to go to a MD appointment. Nurse writer reoriented pt to date, time, location, and situation. Pt laid down but continue to experience confusion for the rest of the shift.

## 2020-08-19 NOTE — Assessment & Plan Note (Signed)
#  80 year old male patient with recently diagnosed multiple liver lesions concerning for malignancy; pancreatic tail mass is currently admitted to hospital for abdominal pain/constipation.  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  Tumor marker CA 19-9 >20,000.   #Abdominal pain-multifactorial; predominant constipation/context of liver metastases.  #Constipation likely narcotic induced-s/p bowel movement this morning.  #Elevated LFTs-likely secondary to underlying liver disease/malignancy.  No evidence of any obstruction noted.   #Delirium-likely secondary to narcotics/sundowning.  No significant clinical concerns for metastatic disease to the brain.  #Recommendations:  #I had a long discussion the patient/wife and daughter-regarding the serious diagnosis/and guarded prognosis.  Discussed that with systemic chemotherapy life expectancy is in the order of 8 to 12 months.  Discussed option of chemotherapy-gemcitabine Abraxane-response rates of about 25%. Without any chemotherapy in the order of few months.  Hospice would be recommended if patient declined chemotherapy. Discussed the potential side effects including but not limited to-increasing fatigue, nausea vomiting, diarrhea, hair loss, sores in the mouth, increase risk of infection and also neuropathy.    # At the end of the discussion patient/family were leaning towards proceeding with chemotherapy.  I would recommend starting chemotherapy next week-given overall patient's borderline performance status.  They understand patient is at high risk of complications given his age/borderline performance status/and social situation.  #The above plan of care was discussed with [facility] palliative care.  Also discussed with patient/wife and daughter.  Thank you Dr. Jimmye Norman for allowing me to participate in the care of your pleasant patient. Please do not hesitate to contact me with questions or concerns in the  interim.

## 2020-08-19 NOTE — Progress Notes (Signed)
START ON PATHWAY REGIMEN - Pancreatic Adenocarcinoma     A cycle is every 28 days:     Nab-paclitaxel (protein bound)      Gemcitabine   **Always confirm dose/schedule in your pharmacy ordering system**  Patient Characteristics: Metastatic Disease, First Line, PS = 0,1, BRCA1/2 and PALB2  Mutation Absent/Unknown Therapeutic Status: Metastatic Disease Line of Therapy: First Line ECOG Performance Status: 1 BRCA1/2 Mutation Status: Awaiting Test Results PALB2 Mutation Status: Awaiting Test Results Intent of Therapy: Non-Curative / Palliative Intent, Discussed with Patient 

## 2020-08-19 NOTE — Progress Notes (Signed)
Physical Therapy Treatment Patient Details Name: Ricky Riley MRN: [id number] DOB: 1940/08/11 Today's Date: 08/19/2020    History of Present Illness Patient is a 80 y.o. 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 Reports he is not feeling much better. Continues to have abdominal pain. Patient performed bed level exercises. Performs bed mobility with mod independence, transfers with supervision. Ambulated 125 feet with RW and min guard. Slow cadence, flexed posture. Making good progress toward goals.  Patient will continue to benefit from skilled PT while here to improve strength, endurance and safety with mobility.       Follow Up Recommendations  Home health PT;Supervision for mobility/OOB     Equipment Recommendations  Rolling walker with 5" wheels    Recommendations for Other Services       Precautions / Restrictions Precautions Precaution Comments: low fall Restrictions Weight Bearing Restrictions: No    Mobility  Bed Mobility Overal bed mobility: Modified Independent Bed Mobility: Supine to Sit;Sit to Supine     Supine to sit: HOB elevated;Modified independent (Device/Increase time) Sit to supine: HOB elevated;Modified independent (Device/Increase time)   General bed mobility comments: increased time required to complete tasks. verbal cues for sequencing and technique    Transfers Overall transfer level: Needs assistance Equipment used: Rolling walker (2 wheeled) Transfers: Sit to/from Stand Sit to Stand: Supervision            Ambulation/Gait Ambulation/Gait assistance: Min guard Gait Distance (Feet): 125 Feet Assistive device: Rolling walker (2 wheeled) Gait  Pattern/deviations: Step-through pattern;Decreased step length - right;Decreased step length - left Gait velocity: decr   General Gait Details: Patient ambulated well without LOB, reliance on UE support.   Stairs             Wheelchair Mobility    Modified Rankin (Stroke Patients Only)       Balance Overall balance assessment: Needs assistance Sitting-balance support: Feet supported Sitting balance-Leahy Scale: Good     Standing balance support: Bilateral upper extremity supported;During functional activity Standing balance-Leahy Scale: Fair Standing balance comment: patient using rolling walker for support in standing position                            Cognition Arousal/Alertness: Awake/alert Behavior During Therapy: WFL for tasks assessed/performed Overall Cognitive Status: No family/caregiver present to determine baseline cognitive functioning                                 General Comments: patient able to follow single step commands without difficulty      Exercises Other Exercises Other Exercises: B LE exercises: heel slides, hip abd/add, SLR x 5 reps each    General Comments        Pertinent Vitals/Pain Pain Assessment: Faces Faces Pain Scale: Hurts little more Pain Location: lower abdomen Pain Descriptors / Indicators: Aching;Sore Pain Intervention(s): Limited activity within patient's tolerance;Monitored during session    Home Living                      Prior Function  PT Goals (current goals can now be found in the care plan section) Acute Rehab PT Goals Patient Stated Goal: have less pain and more strength PT Goal Formulation: With patient Time For Goal Achievement: 09/01/20 Potential to Achieve Goals: Good Progress towards PT goals: Progressing toward goals    Frequency    Min 2X/week      PT Plan Current plan remains appropriate    Co-evaluation              AM-PAC PT  "6 Clicks" Mobility   Outcome Measure  Help needed turning from your back to your side while in a flat bed without using bedrails?: A Little Help needed moving from lying on your back to sitting on the side of a flat bed without using bedrails?: A Little Help needed moving to and from a bed to a chair (including a wheelchair)?: A Little Help needed standing up from a chair using your arms (e.g., wheelchair or bedside chair)?: A Little Help needed to walk in hospital room?: A Little Help needed climbing 3-5 steps with a railing? : A Lot 6 Click Score: 17    End of Session Equipment Utilized During Treatment: Gait belt Activity Tolerance: Patient tolerated treatment well;Patient limited by fatigue Patient left: in bed;with call bell/phone within reach Nurse Communication: Mobility status PT Visit Diagnosis: Muscle weakness (generalized) (M62.81);Other abnormalities of gait and mobility (R26.89);Difficulty in walking, not elsewhere classified (R26.2);Unsteadiness on feet (R26.81)     Time: 4401-0272 PT Time Calculation (min) (ACUTE ONLY): 23 min  Charges:  $Gait Training: 8-22 mins $Therapeutic Exercise: 8-22 mins                     [employer], PT, [employer] 08/19/20,2:54 PM

## 2020-08-19 NOTE — Plan of Care (Signed)

## 2020-08-19 NOTE — Progress Notes (Addendum)
Dr Jimmye Norman made aware of reports from night nurse regarding pt confusion after taking pain medication, acknowledged, no new orders

## 2020-08-19 NOTE — Progress Notes (Signed)
[facility]  [phone] [phone]   Name: KORDAE BUONOCORE Date: 08/19/2020 MRN: [id number]  DOB: August 30, 1940  Patient Care Team: Baxter Hire, MD as PCP - General (Internal Medicine) Cammie Sickle, MD as Consulting Physician (Internal Medicine)    REASON FOR CONSULTATION: MOMIN MISKO is a 80 y.o. 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  Patient was admitted to the hospital on 08/17/2020 with worsening abdominal pain.  Palliative care was consulted to help address goals and manage ongoing symptoms. .  CODE STATUS: Full code  PAST MEDICAL HISTORY: Past Medical History:  Diagnosis Date  . Arthritis   . Cancer associated pain   . Diabetes mellitus without complication ([place])   . Dysrhythmia   . GERD (gastroesophageal reflux disease)   . Headache   . Hypertension   . Sleep apnea    CPAP     PAST SURGICAL HISTORY:  Past Surgical History:  Procedure Laterality Date  . ARTERY BIOPSY Left 03/02/2015   Procedure: BIOPSY TEMPORAL ARTERY;  Surgeon: Clyde Canterbury, MD;  Location: [facility];  Service: ENT;  Laterality: Left;  . HERNIA REPAIR    . LEFT HEART CATH AND CORONARY ANGIOGRAPHY Left 01/27/2020   Procedure: LEFT HEART CATH AND CORONARY ANGIOGRAPHY;  Surgeon: Teodoro Spray, MD;  Location: [facility];  Service: Cardiovascular;  Laterality: Left;  . PENILE PROSTHESIS IMPLANT    . TRANSURETHRAL RESECTION OF PROSTATE    . XI ROBOTIC ASSISTED INGUINAL HERNIA REPAIR WITH MESH Left 04/22/2019   Procedure: XI ROBOTIC ASSISTED INGUINAL HERNIA REPAIR WITH MESH;  Surgeon: Herbert Pun, MD;  Location: [facility];  Service: General;  Laterality: Left;    HEMATOLOGY/ONCOLOGY HISTORY:  Oncology History   No history exists.     ALLERGIES:  is allergic to atorvastatin, flunisolide, lisinopril, rosuvastatin, ciprofloxacin, penicillins, sulfa antibiotics, and sulfasalazine.  MEDICATIONS:  Current Facility-Administered Medications  Medication Dose Route Frequency Provider Last Rate Last Admin  . aspirin EC tablet 81 mg  81 mg Oral Daily Tu, Ching T, DO   81 mg at 08/19/20 0901  . docusate sodium (COLACE) capsule 200 mg  200 mg Oral BID Wyvonnia Dusky, MD   200 mg at 08/19/20 0901  . enoxaparin (LOVENOX) injection 40 mg  40 mg Subcutaneous Q24H Tu, Ching T, DO   40 mg at 08/18/20 2016  . finasteride (PROSCAR) tablet 5 mg  5 mg Oral Daily Tu, Ching T, DO   5 mg at 08/19/20 0901  . HYDROcodone-acetaminophen (NORCO/VICODIN) 5-325 MG per tablet 1 tablet  1 tablet Oral Q4H PRN Wyvonnia Dusky, MD   1 tablet at 08/19/20 0901  . insulin aspart (novoLOG) injection 0-9 Units  0-9 Units Subcutaneous TID WC Wyvonnia Dusky, MD   1 Units at 08/19/20 1156  . lactulose (CHRONULAC) 10 GM/15ML solution 30 g  30 g Oral BID Wyvonnia Dusky, MD   30 g at 08/19/20 0902  . mirtazapine (REMERON) tablet 15 mg  15 mg Oral QHS PRN Tu, Ching T, DO   15 mg at 08/18/20 2015  . morphine 2 MG/ML injection 1 mg  1 mg Intravenous Q3H PRN Tu, Ching T, DO   1 mg at 08/19/20 0058  . ondansetron (ZOFRAN) injection 4 mg  4 mg Intravenous Q6H PRN Tu,  Ching T, DO   4 mg at 08/19/20 0058  . pantoprazole (PROTONIX) EC tablet 40 mg  40 mg Oral Daily Tu, Ching T, DO   40 mg at 08/19/20 0901  . polyethylene glycol (MIRALAX / GLYCOLAX) packet 17 g  17 g Oral Daily Tu, Ching T, DO   17 g at 08/19/20 0901  . senna-docusate (Senokot-S) tablet 1 tablet  1 tablet Oral BID Tu, Ching T, DO   1 tablet at 08/19/20 0901   Facility-Administered Medications Ordered in Other Encounters  Medication Dose Route Frequency Provider Last Rate Last Admin  . 0.9 %  sodium chloride infusion  250 mL Intravenous PRN Teodoro Spray, MD      . 0.9% sodium chloride  infusion  1 mL/kg/hr Intravenous Continuous Teodoro Spray, MD      . sodium chloride flush (NS) 0.9 % injection 3 mL  3 mL Intravenous Q12H Teodoro Spray, MD      . sodium chloride flush (NS) 0.9 % injection 3 mL  3 mL Intravenous PRN Teodoro Spray, MD        VITAL SIGNS: BP 138/89 (BP Location: Right Arm)   Pulse 80   Temp 97.8 F (36.6 C)   Resp 18   Ht 6' 3"  (1.905 m)   Wt 185 lb (83.9 kg)   SpO2 96%   BMI 23.12 kg/m  Filed Weights   08/17/20 1620  Weight: 185 lb (83.9 kg)    Estimated body mass index is 23.12 kg/m as calculated from the following:   Height as of this encounter: 6' 3"  (1.905 m).   Weight as of this encounter: 185 lb (83.9 kg).  LABS: CBC:    Component Value Date/Time   WBC 11.2 (H) 08/19/2020 0546   HGB 12.1 (L) 08/19/2020 0546   HCT 36.9 (L) 08/19/2020 0546   PLT 198 08/19/2020 0546   MCV 79.4 (L) 08/19/2020 0546   NEUTROABS 4.7 12/12/2019 0836   LYMPHSABS 0.9 12/12/2019 0836   MONOABS 1.1 (H) 12/12/2019 0836   EOSABS 0.1 12/12/2019 0836   BASOSABS 0.0 12/12/2019 0836   Comprehensive Metabolic Panel:    Component Value Date/Time   NA 132 (L) 08/19/2020 0546   K 4.4 08/19/2020 0546   CL 102 08/19/2020 0546   CO2 22 08/19/2020 0546   BUN 23 08/19/2020 0546   CREATININE 1.52 (H) 08/19/2020 0546   GLUCOSE 120 (H) 08/19/2020 0546   CALCIUM 10.0 08/19/2020 0546   AST 49 (H) 08/19/2020 0546   ALT 41 08/19/2020 0546   ALKPHOS 194 (H) 08/19/2020 0546   BILITOT 1.5 (H) 08/19/2020 0546   PROT 6.3 (L) 08/19/2020 0546   ALBUMIN 3.0 (L) 08/19/2020 0546    RADIOGRAPHIC STUDIES: CT ABDOMEN PELVIS W CONTRAST  Result Date: 08/11/2020 CLINICAL DATA:  Bilateral flank pain EXAM: CT ABDOMEN AND PELVIS WITH CONTRAST TECHNIQUE: Multidetector CT imaging of the abdomen and pelvis was performed using the standard protocol following bolus administration of intravenous contrast. CONTRAST:  111m OMNIPAQUE IOHEXOL 300 MG/ML  SOLN COMPARISON:  CT 12/12/2019  FINDINGS: Lower chest: Lung bases demonstrate no acute consolidation or effusion. Probable scarring at the right middle lobe and right base. Borderline cardiomegaly. No significant pericardial effusion. Hepatobiliary: Interim development of numerous hypodense hepatic masses concerning for metastatic disease. No calcified gallstone. No biliary dilatation. The gallbladder appears slightly thick walled. Pancreas: No inflammatory changes. Mild pancreatic ductal dilatation. Irregular hypodense mass at the tail of the pancreas measuring 5.8 by 3.1  by 3.4 cm. Spleen: Normal in size without focal abnormality. Adrenals/Urinary Tract: Adrenal glands are within normal limits. Kidneys show bilateral cysts. No hydronephrosis. The urinary bladder is unremarkable. Stomach/Bowel: The stomach is nonenlarged. No dilated small bowel. No acute bowel wall thickening. Negative appendix Vascular/Lymphatic: Mild aortic atherosclerosis. No aneurysmal dilatation. Poorly defined soft tissue density at the gastrohepatic ligament measuring approximately 3 x 1.5 cm concerning for metastatic disease. Periportal lymph nodes measuring up to 18 cm. 13 mm left retroperitoneal lymph node at the level of the SMA. Reproductive: Prostate unremarkable. Penile prosthesis with left pelvic deflated reservoir. Other: No free air. Small amount of abdominopelvic ascites. Small mesenteric nodules, for example 3 mm nodule anterior to the distal descending colon, series 2, image number 53. Multiple small soft tissue nodules in the right colic gutter. Multiple soft tissue nodules anterior to the left hepatic lobe measuring up to 17 mm, series 7, image number 17. Musculoskeletal: New lucent lesion with peripheral sclerosis in the posterior left ilium, series 2, image number 66 indeterminate for metastatic disease. IMPRESSION: 1. Interim development of numerous hypodense hepatic masses concerning for metastatic disease. Irregular hypodense mass at the tail of the  pancreas measuring up to 5.8 cm, concerning for primary pancreatic neoplasm. 2. Gastrohepatic, periportal and retroperitoneal adenopathy also concerning for metastatic disease. 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 3. New lucent lesion with peripheral sclerosis in the posterior left ilium, indeterminate for metastatic disease. Aortic Atherosclerosis (ICD10-I70.0). Electronically Signed   By: Donavan Foil M.D.   On: 08/11/2020 21:50   US BIOPSY (LIVER)  Result Date: 08/16/2020 INDICATION: Pancreatic mass and multiple liver lesions EXAM: Ultrasound-guided liver lesion biopsy MEDICATIONS: None. ANESTHESIA/SEDATION: Moderate (conscious) sedation was employed during this procedure. A total of Versed 1 mg and Fentanyl 50 mcg was administered intravenously. Moderate Sedation Time: The 15 minutes. The patient's level of consciousness and vital signs were monitored continuously by radiology nursing throughout the procedure under my direct supervision. COMPLICATIONS: None immediate. PROCEDURE: Informed written consent was obtained from the patient after a thorough discussion of the procedural risks, benefits and alternatives. All questions were addressed. Maximal Sterile Barrier Technique was utilized including caps, mask, sterile gowns, sterile gloves, sterile drape, hand hygiene and skin antiseptic. A timeout was performed prior to the initiation of the procedure. Patient position left lateral decubitus on the ultrasound table. Right upper quadrant skin prepped and draped in usual sterile fashion. Following local lidocaine administration, 17 gauge introducer needle was advanced into 1 of the right liver lesions, and four 18 gauge cores were obtained utilizing continuous ultrasound guidance. Gelfoam slurry was administered through the introducer needle at the biopsy site. Samples were sent to pathology in formalin. Needle removed and  hemostasis achieved with 5 minutes of manual compression. Post procedure ultrasound images showed no evidence of significant hemorrhage. IMPRESSION: Ultrasound-guided biopsy of right liver mass. Electronically Signed   By: Miachel Roux M.D.   On: 08/16/2020 10:34   DG Abd 2 Views  Result Date: 08/18/2020 CLINICAL DATA:  Constipation due to opiate therapy. EXAM: ABDOMEN - 2 VIEW COMPARISON:  CT 08/11/2020 FINDINGS: No free intra-abdominal air moderate stool in the ascending, transverse, proximal descending colon. Small volume of stool in the distal descending and sigmoid colon. Enteric contrast from prior abdominal CT is present in the left colon. No small bowel dilatation or evidence of obstruction. 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 Linear right basilar atelectasis. IMPRESSION: Moderate stool in the colon. No evidence of  bowel obstruction. Previous enteric contrast from prior abdominal CT is present in the left colon. Electronically Signed   By: Keith Rake M.D.   On: 08/18/2020 20:04    PERFORMANCE STATUS (ECOG) : 2 - Symptomatic, <50% confined to bed  Review of Systems Unless otherwise noted, a complete review of systems is negative.  Physical Exam General: NAD Pulmonary: weakness Abdomen: soft, nontender GU: no suprapubic tenderness Extremities: no edema, no joint deformities Skin: no rashes Neurological: Weakness but otherwise nonfocal  IMPRESSION: Participated in family meeting with Dr. Rogue Bussing.  We met with patient, wife, and daughter.  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  Family verbalized understanding that prognosis is poor and life expectancy is likely months even with treatment.  Patient verbalized desire to pursue chemotherapy, which will be arranged as soon as possible.  Family also discussed option of obtaining a second opinion.  Patient reports that his pain has been somewhat improved today.  He did have a bowel movement earlier today.  He  is only required 1 dose of Norco today.  RN reports that Norco continues to cause confusion.  He reportedly had difficulty sleeping last night due to confusion.  Will rotate from [place] to oxycodone.  We will also prescribe low-dose olanzapine at bedtime as needed for sleep.  We discussed decision-making and I would recommend patient establishing ACP documents.  Family are processing the news today and we can discuss these issues in more detail once patient follows up in the cancer center.  Patient is his wife's primary caregiver.  Family recognize that patient and wife are likely to require more support going forward.  Plan is to discharge him home with home health when medically ready.  PLAN: -Continue current scope of treatment -Home with home health when medically ready -DC Norco -Start oxycodone 5 mg every 6 as needed -Continue daily bowel regimen -Olanzapine 5 mg nightly as needed for sleep/agitation -We will plan follow-up in the cancer center  Case and plan discussed with Dr. Rogue Bussing   Patient expressed understanding and was in agreement with this plan. He also understands that He can call the clinic at any time with any questions, concerns, or complaints.     Time Total: 45 minutes  Visit consisted of counseling and education dealing with the complex and emotionally intense issues of symptom management and palliative care in the setting of serious and potentially life-threatening illness.Greater than 50%  of this time was spent counseling and coordinating care related to the above assessment and plan.  Signed by: Altha Harm, PhD, NP-C

## 2020-08-19 NOTE — Progress Notes (Addendum)
PROGRESS NOTE    Ricky Riley  WUJ:[id number] DOB: 1941-05-17 DOA: 08/17/2020 PCP: Baxter Hire, MD   Assessment & Plan:   Principal Problem:   Abdominal pain Active Problems:   Hypertension   Sleep apnea   Transaminitis   Acute renal failure superimposed on stage 3a chronic kidney disease ([place])   Type II diabetes mellitus with renal manifestations ([place])   Pancreatic mass   Palliative care encounter   Abdominal pain: still w/ pain. Narcotics cause confusion in pt here as well as at home as per pt's wife. Possibly secondary to pancreatic mass w/ likely liver mets vs constipation. Norco, morphine prn. 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 S/p liver biopsy on 08/16/20. Onco consulted. Palliative care is following   Constipation: likely secondary to opioid use. Continue on miralax, colace  & started lactulose.   AKI on CKDIIIa: Cr is stable from day prior. Will continue to monitor   Transaminitis: likely secondary to liver mets. ALT is WNL and AST is still elevated but trending down   Leukocytosis: possibly reactive. Will continue to monitor   Hyponatremia: labile. Will continue to monitor   HTN: continue to hold ACE-I secondary to AKI   DM2: relatively well controlled w/ HbA1c 7.2. Continue on SSI w/ accuchecks   OSA: non-compliant w/ CPAP     DVT prophylaxis: lovenox Code Status: full  Family Communication: discussed pt's care w/ pt's wife, Onalee Hua, & her questions Disposition Plan: likely back home   Level of care: Med-Surg    Status is: Inpatient  Remains inpatient appropriate because:IV treatments appropriate due to intensity of illness or inability to take PO and Inpatient level of care appropriate due to severity of illness   Dispo: The patient is from: Home              Anticipated d/c is to: Home              Patient currently is not medically stable to d/c.   Difficult to place patient  Yes    Consultants:      Procedures:    Antimicrobials:    Subjective: Pt is confused but still c/o abd pain  Objective: Vitals:   08/18/20 1609 08/18/20 2059 08/19/20 0024 08/19/20 0401  BP: (!) 160/92 (!) 159/82 (!) 147/72 (!) 141/65  Pulse: 71 83 99 95  Resp: 18 18 18 20   Temp: 98.4 F (36.9 C) 98.7 F (37.1 C) 98.5 F (36.9 C) 98.6 F (37 C)  TempSrc:  Oral Oral Oral  SpO2: 97% 97% 96% 93%  Weight:      Height:        Intake/Output Summary (Last 24 hours) at 08/19/2020 0716 Last data filed at 08/19/2020 0010 Gross per 24 hour  Intake 360 ml  Output 500 ml  Net -140 ml   Filed Weights   08/17/20 1620  Weight: 83.9 kg    Examination:  General exam: appears lethargic  Respiratory system: clear breath sounds b/l  Cardiovascular system: S1/S2+. No rubs or gallops Gastrointestinal system: Abd is soft, tenderness to palpation, ND & hypoactive bowel sounds  Central nervous system: Lethargic. Moves all 4 extremities  Psychiatry: Judgement and insight appear abnormal. Flat mood and affect    Data Reviewed: I have personally reviewed following labs and imaging studies  CBC: Recent Labs  Lab 08/14/20 0908 08/16/20 0847 08/17/20 1705 08/18/20 0507 08/19/20 0546  WBC 7.9 8.8 9.4 9.4 11.2*  HGB 11.8* 12.0* 11.5* 11.8* 12.1*  HCT 36.3* 37.2* 35.4* 36.0* 36.9*  MCV 80.0 81.6 80.8 79.8* 79.4*  PLT 180 183 175 193 846   Basic Metabolic Panel: Recent Labs  Lab 08/12/20 0945 08/14/20 0908 08/17/20 1705 08/18/20 0507 08/19/20 0546  NA 132* 134* 134* 135 132*  K 4.0 3.9 4.1 4.2 4.4  CL 96* 102 102 103 102  CO2 24 22 25 24 22   GLUCOSE 163* 125* 151* 145* 120*  BUN 23 19 21 22 23   CREATININE 1.41* 1.36* 1.39* 1.52* 1.52*  CALCIUM 10.2 10.0 9.9 9.8 10.0   GFR: Estimated Creatinine Clearance: 48.3 mL/min (A) (by C-G formula based on SCr of 1.52 mg/dL (H)). Liver Function Tests: Recent Labs  Lab 08/12/20 0945 08/14/20 0908 08/17/20 1705  08/19/20 0546  AST 58* 52* 78* 49*  ALT 56* 45* 51* 41  ALKPHOS 180* 194* 209* 194*  BILITOT 1.0 1.1 1.1 1.5*  PROT 7.4 7.2 6.7 6.3*  ALBUMIN 3.8 3.6 3.3* 3.0*   Recent Labs  Lab 08/14/20 0908 08/17/20 1705  LIPASE 26 22   No results for input(s): AMMONIA in the last 168 hours. Coagulation Profile: Recent Labs  Lab 08/12/20 0945 08/16/20 0847  INR 1.3* 1.2   Cardiac Enzymes: No results for input(s): CKTOTAL, CKMB, CKMBINDEX, TROPONINI in the last 168 hours. BNP (last 3 results) No results for input(s): PROBNP in the last 8760 hours. HbA1C: Recent Labs    08/18/20 0507  HGBA1C 6.4*   CBG: Recent Labs  Lab 08/16/20 0904 08/16/20 1036 08/18/20 1153 08/18/20 1608 08/18/20 2059  GLUCAP 118* 105* 129* 145* 145*   Lipid Profile: No results for input(s): CHOL, HDL, LDLCALC, TRIG, CHOLHDL, LDLDIRECT in the last 72 hours. Thyroid Function Tests: No results for input(s): TSH, T4TOTAL, FREET4, T3FREE, THYROIDAB in the last 72 hours. Anemia Panel: No results for input(s): VITAMINB12, FOLATE, FERRITIN, TIBC, IRON, RETICCTPCT in the last 72 hours. Sepsis Labs: No results for input(s): PROCALCITON, LATICACIDVEN in the last 168 hours.  Recent Results (from the past 240 hour(s))  SARS CORONAVIRUS 2 (TAT 6-24 HRS) Nasopharyngeal Nasopharyngeal Swab     Status: None   Collection Time: 08/17/20  6:53 PM   Specimen: Nasopharyngeal Swab  Result Value Ref Range Status   SARS Coronavirus 2 NEGATIVE NEGATIVE Final    Comment: (NOTE) SARS-CoV-2 target nucleic acids are NOT DETECTED.  The SARS-CoV-2 RNA is generally detectable in upper and lower respiratory specimens during the acute phase of infection. Negative results do not preclude SARS-CoV-2 infection, do not rule out co-infections with other pathogens, and should not be used as the sole basis for treatment or other patient management decisions. Negative results must be combined with clinical observations, patient history,  and epidemiological information. The expected result is Negative.  Fact Sheet for Patients: SugarRoll.be  Fact Sheet for Healthcare Providers: https://www.woods-mathews.com/  This test is not yet approved or cleared by the [place] FDA and  has been authorized for detection and/or diagnosis of SARS-CoV-2 by FDA under an Emergency Use Authorization (EUA). This EUA will remain  in effect (meaning this test can be used) for the duration of the COVID-19 declaration under Se ction 564(b)(1) of the Act, 21 U.S.C. section 360bbb-3(b)(1), unless the authorization is terminated or revoked sooner.  Performed at [facility], [place] [address] [place], [place] [address]          Radiology Studies: DG Abd 2 Views  Result Date: 08/18/2020 CLINICAL DATA:  Constipation due  to opiate therapy. EXAM: ABDOMEN - 2 VIEW COMPARISON:  CT 08/11/2020 FINDINGS: No free intra-abdominal air moderate stool in the ascending, transverse, proximal descending colon. Small volume of stool in the distal descending and sigmoid colon. Enteric contrast from prior abdominal CT is present in the left colon. No small bowel dilatation or evidence of obstruction. 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 Linear right basilar atelectasis. IMPRESSION: Moderate stool in the colon. No evidence of bowel obstruction. Previous enteric contrast from prior abdominal CT is present in the left colon. Electronically Signed   By: Keith Rake M.D.   On: 08/18/2020 20:04        Scheduled Meds: . aspirin EC  81 mg Oral Daily  . docusate sodium  200 mg Oral BID  . enoxaparin (LOVENOX) injection  40 mg Subcutaneous Q24H  . finasteride  5 mg Oral Daily  . insulin aspart  0-9 Units Subcutaneous TID WC  . pantoprazole  40 mg Oral Daily  . polyethylene glycol  17 g Oral Daily  . senna-docusate  1 tablet Oral BID   Continuous Infusions:   LOS: 0 days    Time spent: 32 mins     Wyvonnia Dusky, MD [facility] Pager [phone]  If 7PM-7AM, please contact night-coverage 08/19/2020, 7:16 AM

## 2020-08-19 NOTE — TOC Progression Note (Addendum)
Transition of Care [facility]) - Progression Note    Patient Details  Name: Ricky Riley MRN: [id number] Date of Birth: 07-Aug-1940  Transition of Care [facility]) CM/SW [place], LCSW Phone Number: 08/19/2020, 1:31 PM  Clinical Narrative:   Reached out to [facility], [facility], and [facility] to see if they can accept for [facility]. [facility] Representative [place] accepted patient for PT, OT, RN, and Aide services. She is aware of plan to discharge over weekend.  Expected Discharge Plan: [place] Barriers to Discharge: Continued Medical Work up  Expected Discharge Plan and Services Expected Discharge Plan: [place] arrangements for the past 2 months: Single Family Home                 DME Arranged: [place] rolling DME Agency: [facility] Date DME Agency Contacted: 08/18/20   Representative spoke with at DME Agency: [place]: PT,OT,RN,Nurse's Aide           Social Determinants of Health (SDOH) Interventions    Readmission Risk Interventions No flowsheet data found.

## 2020-08-20 LAB — COMPREHENSIVE METABOLIC PANEL
ALT: 36 U/L (ref 0–44)
AST: 44 U/L — ABNORMAL HIGH (ref 15–41)
Albumin: 3 g/dL — ABNORMAL LOW (ref 3.5–5.0)
Alkaline Phosphatase: 201 U/L — ABNORMAL HIGH (ref 38–126)
Anion gap: 9 (ref 5–15)
BUN: 23 mg/dL (ref 8–23)
CO2: 21 mmol/L — ABNORMAL LOW (ref 22–32)
Calcium: 10.1 mg/dL (ref 8.9–10.3)
Chloride: 105 mmol/L (ref 98–111)
Creatinine, Ser: 1.43 mg/dL — ABNORMAL HIGH (ref 0.61–1.24)
GFR, Estimated: 50 mL/min — ABNORMAL LOW (ref >60)
Glucose, Bld: 130 mg/dL — ABNORMAL HIGH (ref 70–99)
Potassium: 4.6 mmol/L (ref 3.5–5.1)
Sodium: 135 mmol/L (ref 135–145)
Total Bilirubin: 1.7 mg/dL — ABNORMAL HIGH (ref 0.3–1.2)
Total Protein: 6.6 g/dL (ref 6.5–8.1)

## 2020-08-20 LAB — CBC
HCT: 36.7 % — ABNORMAL LOW (ref 39.0–52.0)
Hemoglobin: 12.4 g/dL — ABNORMAL LOW (ref 13.0–17.0)
MCH: 26.6 pg (ref 26.0–34.0)
MCHC: 33.8 g/dL (ref 30.0–36.0)
MCV: 78.8 fL — ABNORMAL LOW (ref 80.0–100.0)
Platelets: 221 10*3/uL (ref 150–400)
RBC: 4.66 MIL/uL (ref 4.22–5.81)
RDW: 13.2 % (ref 11.5–15.5)
WBC: 12.2 10*3/uL — ABNORMAL HIGH (ref 4.0–10.5)
nRBC: 0 % (ref 0.0–0.2)

## 2020-08-20 LAB — GLUCOSE, CAPILLARY
Glucose-Capillary: 138 mg/dL — ABNORMAL HIGH (ref 70–99)
Glucose-Capillary: 171 mg/dL — ABNORMAL HIGH (ref 70–99)

## 2020-08-20 LAB — EXPECTORATED SPUTUM ASSESSMENT W GRAM STAIN, RFLX TO RESP C

## 2020-08-20 MED ORDER — AMLODIPINE BESYLATE 10 MG PO TABS
10.0000 mg | ORAL_TABLET | Freq: Every day | ORAL | Status: DC
  Administered 2020-08-20: 12:00:00 10 mg via ORAL
  Filled 2020-08-20: qty 1

## 2020-08-20 MED ORDER — OXYCODONE HCL 5 MG PO TABS: 5.0000 mg | ORAL_TABLET | Freq: Four times a day (QID) | ORAL | 0 refills | Status: DC | PRN

## 2020-08-20 NOTE — Discharge Summary (Signed)
Physician Discharge Summary  Ricky Riley FYB:[id number] DOB: Jul 04, 1940 DOA: 08/17/2020  PCP: Baxter Hire, MD  Admit date: 08/17/2020 Discharge date: 08/20/2020  Admitted From: home  Disposition:  Home w/ home health   Recommendations for Outpatient Follow-up:  1. Follow up with PCP in 1-2 weeks 2. F/u w/ onco, Dr. Rogue Bussing in 2-3 days  Home Health: yes Equipment/Devices:  Discharge Condition: stable  CODE STATUS: full  Diet recommendation: Heart Healthy / Carb Modified   Brief/Interim Summary: HPI was taken from Dr. Flossie Buffy: Ricky Riley is a 80 y.o. 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  Patient has had ongoing abdominal pain for at least the past month.  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  He has had follow-up with oncology and had ultrasound-guided liver biopsy yesterday on 3/8.  States he previously received tramadol for his pain which did not work.  He has most recently been taking hydrocodone 5 mg every 4 hours but pain would come back as soon as medications wore off.  Pain is mostly to the right side of the abdomen but overall is diffused.  Pain is sharp and crampy.  He also notes constipation and no bowel movement for the past 3 to 4 days.  Has not been taking any stool softeners with his pain medication. Has had flatulence.  Sometimes feels nauseous but no vomiting.  Also has noticed some feelings of urinary retention.  No fever.  Has decreased appetite and has felt generalized weakness for the past several weeks.   ED Course: He was afebrile, normotensive. Bladder scan performed for symptoms of  urinary retention but only 40 mg max value of found.  BC shows no leukocytosis and stable anemia with hemoglobin of 11.5.  Elevated creatinine of 1.39 from a prior normal of 1.2.  Mildly elevated AST of 78, ALT of 51, alkaline phosphatase of 209.  Normal total bilirubin. He received several doses of morphine in the ED with some relief of pain.  ED physician Dr. Ermalinda Barrios discussed case with oncology and they will follow in consultation tomorrow.  Hospital course from Dr. Jimmye Norman 3/10-3/12/22: Pt presented w/ abd pain possibly secondary to pancreatic mass w/ liver mets as well as constipation. Pt's was able to have a bowel movement prior to d/c. Pt's pain meds were changed to oxycodone as pt was becoming confused after norco use. Pt's wife is aware that all narcotics have the potential to cause confusion. Of note, pt will start treatment for likely pancreatic cancer next week outpatient. For more information please see other progress/consult notes.   Discharge Diagnoses:  Principal Problem:   Abdominal pain Active Problems:   Hypertension   Sleep apnea   Transaminitis   Acute renal failure superimposed on stage 3a chronic kidney disease (HCC)   Type II diabetes mellitus with renal manifestations (HCC)   Pancreatic mass   Palliative care encounter   Primary cancer of tail of pancreas (HCC)   Constipation due to opioid therapy  Abdominal pain: still w/ pain. Narcotics cause confusion in pt here as well as at home as per pt's wife. Possibly secondary to pancreatic mass w/ likely  liver mets vs constipation. Norco, morphine prn. 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 S/p liver biopsy on 08/16/20. Onco & palliative care are following and recs apprec   Constipation: likely secondary to opioid use. Resolved.   AKI on CKDIIIa: Cr is trending down from day prior   Transaminitis: likely secondary to liver mets. Continues to trend  down  Leukocytosis: possibly reactive. Will continue to monitor   Hyponatremia: resolved  HTN: restart ACE-I at d/c   DM2: relatively well controlled w/ HbA1c 7.2. Continue on SSI w/ accuchecks   OSA: non-compliant w/ CPAP    Discharge Instructions  Discharge Instructions    Diet - low sodium heart healthy   Complete by: As directed    Diet Carb Modified   Complete by: As directed    Discharge instructions   Complete by: As directed    F/u w/ oncology, Dr. Rogue Bussing in 2-3 days. F/u w/ PCP in 1-2 weeks   Increase activity slowly   Complete by: As directed      Allergies as of 08/20/2020      Reactions   Atorvastatin Other (See Comments)   Flunisolide    Lisinopril Hives   Rosuvastatin    Other reaction(s): Cramp in lower limb   Ciprofloxacin Hives   Penicillins Hives   Did it involve swelling of the face/tongue/throat, SOB, or low BP? No Did it involve sudden or severe rash/hives, skin peeling, or any reaction on the inside of your mouth or nose? Unknown Did you need to seek medical attention at a hospital or doctor's office? No When did it last happen?30-40 years ago If all above answers are "NO", may proceed with cephalosporin use.   Sulfa Antibiotics Hives   Sulfasalazine Hives      Medication List    STOP taking these medications   HYDROcodone-acetaminophen 5-325 MG tablet Commonly known as: Norco     TAKE these medications   acetaminophen 500 MG tablet Commonly known as: TYLENOL Take 500 mg by mouth every 6 (six) hours as needed for moderate pain or headache.   aspirin 81 MG EC tablet Take 81 mg by mouth daily.   cetirizine 10 MG tablet Commonly known as: ZYRTEC Take 10 mg by mouth daily as needed for allergies.   finasteride 5 MG tablet Commonly known as: PROSCAR Take 5 mg by mouth daily.   fluticasone 50 MCG/ACT nasal spray Commonly known as: FLONASE Place 2 sprays into both nostrils daily as needed for rhinitis.   metFORMIN 500 MG  tablet Commonly known as: GLUCOPHAGE Take 500 mg by mouth daily with breakfast.   mirtazapine 15 MG tablet Commonly known as: REMERON Take 15 mg by mouth at bedtime as needed (sleep).   omeprazole 20 MG capsule Commonly known as: PRILOSEC Take 20 mg by mouth daily before breakfast.   ondansetron 4 MG disintegrating tablet Commonly known as: Zofran ODT Take 1 tablet (4 mg total) by mouth every 8 (eight) hours as needed for nausea or vomiting.   oxyCODONE 5 MG immediate release tablet Commonly known as: Oxy IR/ROXICODONE Take 1 tablet (5 mg total) by mouth every 6 (six) hours as needed for up to 5 days for moderate pain or severe pain.   senna-docusate 8.6-50 MG tablet Commonly known as: Senokot-S Take 1 tablet by mouth 2 (two) times daily. What changed:   when to take this  reasons to take this   terazosin 10 MG capsule Commonly  known as: HYTRIN Take 10 mg by mouth at bedtime.   traZODone 50 MG tablet Commonly known as: DESYREL Take 50 mg by mouth daily as needed for sleep.            Durable Medical Equipment  (From admission, onward)         Start     Ordered   08/18/20 1425  For home use only DME Walker rolling  Once       Question Answer Comment  Walker: With 5 Inch Wheels   Patient needs a walker to treat with the following condition Generalized weakness      08/18/20 1424          Allergies  Allergen Reactions  . Atorvastatin Other (See Comments)  . Flunisolide   . Lisinopril Hives  . Rosuvastatin     Other reaction(s): Cramp in lower limb  . Ciprofloxacin Hives  . Penicillins Hives    Did it involve swelling of the face/tongue/throat, SOB, or low BP? No Did it involve sudden or severe rash/hives, skin peeling, or any reaction on the inside of your mouth or nose? Unknown Did you need to seek medical attention at a hospital or doctor's office? No When did it last happen?30-40 years ago If all above answers are "NO", may proceed with  cephalosporin use.   . Sulfa Antibiotics Hives  . Sulfasalazine Hives    Consultations:  Onco  Palliative care   Procedures/Studies: CT ABDOMEN PELVIS W CONTRAST  Result Date: 08/11/2020 CLINICAL DATA:  Bilateral flank pain EXAM: CT ABDOMEN AND PELVIS WITH CONTRAST TECHNIQUE: Multidetector CT imaging of the abdomen and pelvis was performed using the standard protocol following bolus administration of intravenous contrast. CONTRAST:  128mL OMNIPAQUE IOHEXOL 300 MG/ML  SOLN COMPARISON:  CT 12/12/2019 FINDINGS: Lower chest: Lung bases demonstrate no acute consolidation or effusion. Probable scarring at the right middle lobe and right base. Borderline cardiomegaly. No significant pericardial effusion. Hepatobiliary: Interim development of numerous hypodense hepatic masses concerning for metastatic disease. No calcified gallstone. No biliary dilatation. The gallbladder appears slightly thick walled. Pancreas: No inflammatory changes. Mild pancreatic ductal dilatation. Irregular hypodense mass at the tail of the pancreas measuring 5.8 by 3.1 by 3.4 cm. Spleen: Normal in size without focal abnormality. Adrenals/Urinary Tract: Adrenal glands are within normal limits. Kidneys show bilateral cysts. No hydronephrosis. The urinary bladder is unremarkable. Stomach/Bowel: The stomach is nonenlarged. No dilated small bowel. No acute bowel wall thickening. Negative appendix Vascular/Lymphatic: Mild aortic atherosclerosis. No aneurysmal dilatation. Poorly defined soft tissue density at the gastrohepatic ligament measuring approximately 3 x 1.5 cm concerning for metastatic disease. Periportal lymph nodes measuring up to 18 cm. 13 mm left retroperitoneal lymph node at the level of the SMA. Reproductive: Prostate unremarkable. Penile prosthesis with left pelvic deflated reservoir. Other: No free air. Small amount of abdominopelvic ascites. Small mesenteric nodules, for example 3 mm nodule anterior to the distal  descending colon, series 2, image number 53. Multiple small soft tissue nodules in the right colic gutter. Multiple soft tissue nodules anterior to the left hepatic lobe measuring up to 17 mm, series 7, image number 17. Musculoskeletal: New lucent lesion with peripheral sclerosis in the posterior left ilium, series 2, image number 90 indeterminate for metastatic disease. IMPRESSION: 1. Interim development of numerous hypodense hepatic masses concerning for metastatic disease. 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 2. Gastrohepatic, periportal and retroperitoneal adenopathy also concerning  for metastatic disease. 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 3. New lucent lesion with peripheral sclerosis in the posterior left ilium, indeterminate for metastatic disease. Aortic Atherosclerosis (ICD10-I70.0). Electronically Signed   By: Donavan Foil M.D.   On: 08/11/2020 21:50   US BIOPSY (LIVER)  Result Date: 08/16/2020 INDICATION: Pancreatic mass and multiple liver lesions EXAM: Ultrasound-guided liver lesion biopsy MEDICATIONS: None. ANESTHESIA/SEDATION: Moderate (conscious) sedation was employed during this procedure. A total of Versed 1 mg and Fentanyl 50 mcg was administered intravenously. Moderate Sedation Time: The 15 minutes. The patient's level of consciousness and vital signs were monitored continuously by radiology nursing throughout the procedure under my direct supervision. COMPLICATIONS: None immediate. PROCEDURE: Informed written consent was obtained from the patient after a thorough discussion of the procedural risks, benefits and alternatives. All questions were addressed. Maximal Sterile Barrier Technique was utilized including caps, mask, sterile gowns, sterile gloves, sterile drape, hand hygiene and skin antiseptic. A timeout was  performed prior to the initiation of the procedure. Patient position left lateral decubitus on the ultrasound table. Right upper quadrant skin prepped and draped in usual sterile fashion. Following local lidocaine administration, 17 gauge introducer needle was advanced into 1 of the right liver lesions, and four 18 gauge cores were obtained utilizing continuous ultrasound guidance. Gelfoam slurry was administered through the introducer needle at the biopsy site. Samples were sent to pathology in formalin. Needle removed and hemostasis achieved with 5 minutes of manual compression. Post procedure ultrasound images showed no evidence of significant hemorrhage. IMPRESSION: Ultrasound-guided biopsy of right liver mass. Electronically Signed   By: Miachel Roux M.D.   On: 08/16/2020 10:34   DG Abd 2 Views  Result Date: 08/18/2020 CLINICAL DATA:  Constipation due to opiate therapy. EXAM: ABDOMEN - 2 VIEW COMPARISON:  CT 08/11/2020 FINDINGS: No free intra-abdominal air moderate stool in the ascending, transverse, proximal descending colon. Small volume of stool in the distal descending and sigmoid colon. Enteric contrast from prior abdominal CT is present in the left colon. No small bowel dilatation or evidence of obstruction. 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 Linear right basilar atelectasis. IMPRESSION: Moderate stool in the colon. No evidence of bowel obstruction. Previous enteric contrast from prior abdominal CT is present in the left colon. Electronically Signed   By: Keith Rake M.D.   On: 08/18/2020 20:04   (Echo, Carotid, EGD, Colonoscopy, ERCP)    Subjective:   Discharge Exam: Vitals:   08/20/20 0418 08/20/20 0736  BP: (!) 170/95 (!) 168/103  Pulse: 88 91  Resp: 16 20  Temp: 97.6 F (36.4 C) 98.4 F (36.9 C)  SpO2: 96% 95%   Vitals:   08/19/20 2014 08/20/20 0018 08/20/20 0418 08/20/20 0736  BP: (!) 161/97 (!) 157/85 (!) 170/95 (!) 168/103  Pulse: 82 89 88 91  Resp: 16 16 16 20   Temp: 98.5  F (36.9 C) 98.2 F (36.8 C) 97.6 F (36.4 C) 98.4 F (36.9 C)  TempSrc: Oral Oral Oral Oral  SpO2: 97% 95% 96% 95%  Weight:      Height:        General: Pt is alert, awake, not in acute distress Cardiovascular: S1/S2 +, no rubs, no gallops Respiratory: CTA bilaterally, no wheezing, no rhonchi Abdominal: Soft, NT, ND, bowel sounds + Extremities:  no cyanosis    The results of significant diagnostics from this hospitalization (including imaging, microbiology, ancillary and laboratory) are listed below for reference.     Microbiology:  Recent Results (from the past 240 hour(s))  SARS CORONAVIRUS 2 (TAT 6-24 HRS) Nasopharyngeal Nasopharyngeal Swab     Status: None   Collection Time: 08/17/20  6:53 PM   Specimen: Nasopharyngeal Swab  Result Value Ref Range Status   SARS Coronavirus 2 NEGATIVE NEGATIVE Final    Comment: (NOTE) SARS-CoV-2 target nucleic acids are NOT DETECTED.  The SARS-CoV-2 RNA is generally detectable in upper and lower respiratory specimens during the acute phase of infection. Negative results do not preclude SARS-CoV-2 infection, do not rule out co-infections with other pathogens, and should not be used as the sole basis for treatment or other patient management decisions. Negative results must be combined with clinical observations, patient history, and epidemiological information. The expected result is Negative.  Fact Sheet for Patients: SugarRoll.be  Fact Sheet for Healthcare Providers: https://www.woods-mathews.com/  This test is not yet approved or cleared by the [place] FDA and  has been authorized for detection and/or diagnosis of SARS-CoV-2 by FDA under an Emergency Use Authorization (EUA). This EUA will remain  in effect (meaning this test can be used) for the duration of the COVID-19 declaration under Se ction 564(b)(1) of the Act, 21 U.S.C. section 360bbb-3(b)(1), unless the authorization is  terminated or revoked sooner.  Performed at [facility], [place] [address], [place], [place] [address]      Labs: BNP (last 3 results) Recent Labs    12/12/19 0836  BNP 998.3*   Basic Metabolic Panel: Recent Labs  Lab 08/14/20 0908 08/17/20 1705 08/18/20 0507 08/19/20 0546 08/20/20 0544  NA 134* 134* 135 132* 135  K 3.9 4.1 4.2 4.4 4.6  CL 102 102 103 102 105  CO2 22 25 24 22  21*  GLUCOSE 125* 151* 145* 120* 130*  BUN 19 21 22 23 23   CREATININE 1.36* 1.39* 1.52* 1.52* 1.43*  CALCIUM 10.0 9.9 9.8 10.0 10.1   Liver Function Tests: Recent Labs  Lab 08/14/20 0908 08/17/20 1705 08/19/20 0546 08/20/20 0544  AST 52* 78* 49* 44*  ALT 45* 51* 41 36  ALKPHOS 194* 209* 194* 201*  BILITOT 1.1 1.1 1.5* 1.7*  PROT 7.2 6.7 6.3* 6.6  ALBUMIN 3.6 3.3* 3.0* 3.0*   Recent Labs  Lab 08/14/20 0908 08/17/20 1705  LIPASE 26 22   No results for input(s): AMMONIA in the last 168 hours. CBC: Recent Labs  Lab 08/16/20 0847 08/17/20 1705 08/18/20 0507 08/19/20 0546 08/20/20 0544  WBC 8.8 9.4 9.4 11.2* 12.2*  HGB 12.0* 11.5* 11.8* 12.1* 12.4*  HCT 37.2* 35.4* 36.0* 36.9* 36.7*  MCV 81.6 80.8 79.8* 79.4* 78.8*  PLT 183 175 193 198 221   Cardiac Enzymes: No results for input(s): CKTOTAL, CKMB, CKMBINDEX, TROPONINI in the last 168 hours. BNP: Invalid input(s): POCBNP CBG: Recent Labs  Lab 08/19/20 0833 08/19/20 1132 08/19/20 1656 08/19/20 2014 08/20/20 0644  GLUCAP 126* 138* 113* 124* 138*   D-Dimer No results for input(s): DDIMER in the last 72 hours. Hgb A1c Recent Labs    08/18/20 0507  HGBA1C 6.4*   Lipid Profile No results for input(s): CHOL, HDL, LDLCALC, TRIG, CHOLHDL, LDLDIRECT in the last 72 hours. Thyroid function studies No results for input(s): TSH, T4TOTAL, T3FREE, THYROIDAB in the last 72 hours.  Invalid input(s): FREET3 Anemia work up No results for input(s): VITAMINB12, FOLATE, FERRITIN, TIBC, IRON, RETICCTPCT in the last 72  hours. Urinalysis    Component Value Date/Time   COLORURINE YELLOW (A) 08/14/2020 0908   APPEARANCEUR CLEAR (A) 08/14/2020  0908   LABSPEC 1.015 08/14/2020 0908   PHURINE 5.0 08/14/2020 0908   GLUCOSEU NEGATIVE 08/14/2020 0908   HGBUR NEGATIVE 08/14/2020 0908   BILIRUBINUR NEGATIVE 08/14/2020 0908   KETONESUR NEGATIVE 08/14/2020 0908   PROTEINUR NEGATIVE 08/14/2020 0908   NITRITE NEGATIVE 08/14/2020 0908   LEUKOCYTESUR NEGATIVE 08/14/2020 0908   Sepsis Labs Invalid input(s): PROCALCITONIN,  WBC,  LACTICIDVEN Microbiology Recent Results (from the past 240 hour(s))  SARS CORONAVIRUS 2 (TAT 6-24 HRS) Nasopharyngeal Nasopharyngeal Swab     Status: None   Collection Time: 08/17/20  6:53 PM   Specimen: Nasopharyngeal Swab  Result Value Ref Range Status   SARS Coronavirus 2 NEGATIVE NEGATIVE Final    Comment: (NOTE) SARS-CoV-2 target nucleic acids are NOT DETECTED.  The SARS-CoV-2 RNA is generally detectable in upper and lower respiratory specimens during the acute phase of infection. Negative results do not preclude SARS-CoV-2 infection, do not rule out co-infections with other pathogens, and should not be used as the sole basis for treatment or other patient management decisions. Negative results must be combined with clinical observations, patient history, and epidemiological information. The expected result is Negative.  Fact Sheet for Patients: SugarRoll.be  Fact Sheet for Healthcare Providers: https://www.woods-mathews.com/  This test is not yet approved or cleared by the [place] FDA and  has been authorized for detection and/or diagnosis of SARS-CoV-2 by FDA under an Emergency Use Authorization (EUA). This EUA will remain  in effect (meaning this test can be used) for the duration of the COVID-19 declaration under Se ction 564(b)(1) of the Act, 21 U.S.C. section 360bbb-3(b)(1), unless the authorization is terminated  or revoked sooner.  Performed at [facility], [place] [address] [place], [place] [address]      Time coordinating discharge: Over 30 minutes  SIGNED:   Wyvonnia Dusky, MD  [facility] 08/20/2020, 11:00 AM Pager   If 7PM-7AM, please contact night-coverage

## 2020-08-20 NOTE — Progress Notes (Signed)
Pt being discharged home, discharge instructions reviewed with pt and son, states understanding, pt with no complaints at discharge

## 2020-08-20 NOTE — Discharge Summary (Addendum)
D/c summary was miss-labeled as a progress note on 08/20/20. Please see that note for full d/c summary

## 2020-08-22 ENCOUNTER — Telehealth: Payer: Self-pay

## 2020-08-22 ENCOUNTER — Inpatient Hospital Stay (HOSPITAL_BASED_OUTPATIENT_CLINIC_OR_DEPARTMENT_OTHER): Payer: Medicare Other | Admitting: Hospice and Palliative Medicine

## 2020-08-22 ENCOUNTER — Other Ambulatory Visit: Payer: Self-pay | Admitting: Anatomic Pathology & Clinical Pathology

## 2020-08-22 ENCOUNTER — Telehealth: Payer: Self-pay | Admitting: *Deleted

## 2020-08-22 ENCOUNTER — Other Ambulatory Visit: Payer: Self-pay

## 2020-08-22 ENCOUNTER — Inpatient Hospital Stay: Payer: Medicare Other

## 2020-08-22 VITALS — BP 129/74 | HR 94 | Temp 96.0°F | Resp 18 | Wt 181.0 lb

## 2020-08-22 DIAGNOSIS — Z515 Encounter for palliative care: Secondary | ICD-10-CM

## 2020-08-22 DIAGNOSIS — K769 Liver disease, unspecified: Secondary | ICD-10-CM

## 2020-08-22 DIAGNOSIS — G893 Neoplasm related pain (acute) (chronic): Secondary | ICD-10-CM

## 2020-08-22 LAB — CBC WITH DIFFERENTIAL/PLATELET
Abs Immature Granulocytes: 0.07 10*3/uL (ref 0.00–0.07)
Basophils Absolute: 0.1 10*3/uL (ref 0.0–0.1)
Basophils Relative: 1 %
Eosinophils Absolute: 0.4 10*3/uL (ref 0.0–0.5)
Eosinophils Relative: 3 %
HCT: 39.7 % (ref 39.0–52.0)
Hemoglobin: 12.9 g/dL — ABNORMAL LOW (ref 13.0–17.0)
Immature Granulocytes: 1 %
Lymphocytes Relative: 7 %
Lymphs Abs: 1 10*3/uL (ref 0.7–4.0)
MCH: 26.1 pg (ref 26.0–34.0)
MCHC: 32.5 g/dL (ref 30.0–36.0)
MCV: 80.2 fL (ref 80.0–100.0)
Monocytes Absolute: 1.4 10*3/uL — ABNORMAL HIGH (ref 0.1–1.0)
Monocytes Relative: 9 %
Neutro Abs: 11.9 10*3/uL — ABNORMAL HIGH (ref 1.7–7.7)
Neutrophils Relative %: 79 %
Platelets: 274 10*3/uL (ref 150–400)
RBC: 4.95 MIL/uL (ref 4.22–5.81)
RDW: 13.2 % (ref 11.5–15.5)
WBC: 14.9 10*3/uL — ABNORMAL HIGH (ref 4.0–10.5)
nRBC: 0 % (ref 0.0–0.2)

## 2020-08-22 LAB — COMPREHENSIVE METABOLIC PANEL
ALT: 36 U/L (ref 0–44)
AST: 50 U/L — ABNORMAL HIGH (ref 15–41)
Albumin: 3.3 g/dL — ABNORMAL LOW (ref 3.5–5.0)
Alkaline Phosphatase: 217 U/L — ABNORMAL HIGH (ref 38–126)
Anion gap: 10 (ref 5–15)
BUN: 34 mg/dL — ABNORMAL HIGH (ref 8–23)
CO2: 23 mmol/L (ref 22–32)
Calcium: 10.3 mg/dL (ref 8.9–10.3)
Chloride: 98 mmol/L (ref 98–111)
Creatinine, Ser: 1.72 mg/dL — ABNORMAL HIGH (ref 0.61–1.24)
GFR, Estimated: 40 mL/min — ABNORMAL LOW (ref >60)
Glucose, Bld: 152 mg/dL — ABNORMAL HIGH (ref 70–99)
Potassium: 4.7 mmol/L (ref 3.5–5.1)
Sodium: 131 mmol/L — ABNORMAL LOW (ref 135–145)
Total Bilirubin: 1.6 mg/dL — ABNORMAL HIGH (ref 0.3–1.2)
Total Protein: 7.2 g/dL (ref 6.5–8.1)

## 2020-08-22 LAB — SURGICAL PATHOLOGY

## 2020-08-22 MED ORDER — NYSTATIN 100000 UNIT/ML MT SUSP: 5.0000 mL | Freq: Four times a day (QID) | OROMUCOSAL | 0 refills | Status: DC

## 2020-08-22 MED ORDER — FENTANYL 12 MCG/HR TD PT72: 1.0000 | MEDICATED_PATCH | TRANSDERMAL | 0 refills | Status: AC

## 2020-08-22 MED ORDER — LACTULOSE 20 GM/30ML PO SOLN: 10.0000 g | Freq: Two times a day (BID) | ORAL | 1 refills | Status: DC | PRN

## 2020-08-22 MED ORDER — OXYCODONE HCL 5 MG PO TABS: 5.0000 mg | ORAL_TABLET | ORAL | 0 refills | Status: AC | PRN

## 2020-08-22 MED ORDER — SODIUM CHLORIDE 0.9 % IV SOLN
INTRAVENOUS | Status: AC
  Filled 2020-08-22 (×2): qty 250

## 2020-08-22 NOTE — Telephone Encounter (Signed)
Ricky Riley called reporting that patient is having pain in his left side radiating around to the back. He reports that he has not had any relief with the Oxycodone. He reports that he has not had a BM since he left the hospital Saturday and that he is not eating much or able to sleep due to pain. He is passing urine, but notes that it is dark brown in color. Please advise

## 2020-08-22 NOTE — Telephone Encounter (Signed)
Minna Antis said he would be glad to see the patient since he is managing his pain.

## 2020-08-22 NOTE — Telephone Encounter (Signed)
Recommend in person evaluation with labs, smc, possible fluids/infusion.

## 2020-08-22 NOTE — Patient Instructions (Signed)
Your port a cath will be inserted on 08/26/2020.  You will need to arrive at 9:30 am.  Please do not eat/drink anything after midnight (6-8 hours prior to the procedure). You will need to bring a driver to take you home.   Ok to increase oxycodone to 10 mg (2 tablets) every 4 hours as needed for breakthrough pain.  Will start transdermal fentanyl for long acting pain control. Please change patch every 3 days.  Will refer to interventional pain management for consideration for nerve block to control pain.  Will need daily bowel regimen for constipation. Recommend Miralax 17 grams daily with Senna - (1 tablet) once or twice daily. Will send prescription for Lactulose as needed for constipation.  Will start Nystatin Oral Rinse/mouthwash- Swish and Swallow the solution every 6 hours.    Continue to drink oral fluids. I would recommend oral supplements (Boost/Ensure) twice daily. We will refer you to [facility] Implanted port insertion is a procedure to put in a port and catheter. The port is a device with an injectable disk that can be accessed by your health care provider. The port is connected to a vein in the chest or neck by a small flexible tube (catheter). There are different types of ports. The implanted port may be used as a long-term IV access for:  Medicines, such as chemotherapy.  Fluids.  Liquid nutrition, such as total parenteral nutrition (TPN). When you have a port, your health care provider can choose to use the port instead of veins in your arms for these procedures. Tell a health care provider about:  Any allergies you have.  All medicines you are taking, especially blood thinners, as well as any vitamins, herbs, eye drops, creams, over-the-counter medicines, and steroids.  Any problems you or family members have had with anesthetic medicines.  Any blood disorders you have.  Any surgeries you have had.  Any medical conditions you have  or have had, including diabetes or kidney problems.  Whether you are pregnant or may be pregnant. What are the risks? Generally, this is a safe procedure. However, problems may occur, including:  Allergic reactions to medicines or dyes.  Damage to other structures or organs.  Infection.  Damage to the blood vessel, bruising, or bleeding at the puncture site.  Blood clot.  Breakdown of the skin over the port.  A collection of air in the chest that can cause one of the lungs to collapse (pneumothorax). This is rare. What happens before the procedure? Staying hydrated Follow instructions from your health care provider about hydration, which may include:  Up to 2 hours before the procedure - you may continue to drink clear liquids, such as water, clear fruit juice, black coffee, and plain tea.   Eating and drinking restrictions Follow instructions from your health care provider about eating and drinking, which may include:  8 hours before the procedure - stop eating heavy meals or foods, such as meat, fried foods, or fatty foods.  6 hours before the procedure - stop eating light meals or foods, such as toast or cereal.  6 hours before the procedure - stop drinking milk or drinks that contain milk.  2 hours before the procedure - stop drinking clear liquids. Medicines Ask your health care provider about:  Changing or stopping your regular medicines. This is especially important if you are taking diabetes medicines or blood thinners.  Taking medicines such as aspirin and ibuprofen. These medicines can thin  your blood. Do not take these medicines unless your health care provider tells you to take them.  Taking over-the-counter medicines, vitamins, herbs, and supplements. General instructions  Plan to have someone take you home from the hospital or clinic.  If you will be going home right after the procedure, plan to have someone with you for 24 hours.  You may have blood  tests.  Do not use any products that contain nicotine or tobacco for at least 4-6 weeks before the procedure. These products include cigarettes, e-cigarettes, and chewing tobacco. If you need help quitting, ask your health care provider.  Ask your health care provider what steps will be taken to help prevent infection. These may include: ? Removing hair at the surgery site. ? Washing skin with a germ-killing soap. ? Taking antibiotic medicine. What happens during the procedure?  An IV will be inserted into one of your veins.  You will be given one or more of the following: ? A medicine to help you relax (sedative). ? A medicine to numb the area (local anesthetic).  Two small incisions will be made to insert the port. ? One smaller incision will be made in your neck to get access to the vein where the catheter will lie. ? The other incision will be made in the upper chest. This is where the port will lie.  The procedure may be done using continuous X-ray (fluoroscopy) or other imaging tools for guidance.  The port and catheter will be placed. There may be a small, raised area where the port is.  The port will be flushed with a salt solution (saline), and blood will be drawn to make sure that it is working correctly.  The incisions will be closed.  Bandages (dressings) may be placed over the incisions. The procedure may vary among health care providers and hospitals.   What happens after the procedure?  Your blood pressure, heart rate, breathing rate, and blood oxygen level will be monitored until you leave the hospital or clinic.  Do not drive for 24 hours if you were given a sedative during your procedure.  You will be given a manufacturer's information card for the type of port that you have. Keep this with you.  Your port will need to be flushed and checked as told by your health care provider, usually every few weeks.  A chest X-ray will be done to: ? Check the placement  of the port. ? Make sure there is no injury to your lung. Summary  Implanted port insertion is a procedure to put in a port and catheter.  The implanted port is used as a long-term IV access.  The port will need to be flushed and checked as told by your health care provider, usually every few weeks.  Keep your manufacturer's information card with you at all times. This information is not intended to replace advice given to you by your health care provider. Make sure you discuss any questions you have with your health care provider. Document Revised: 07/09/2019 Document Reviewed: 12/24/2017 Elsevier Patient Education  [employer].

## 2020-08-22 NOTE — Patient Instructions (Signed)
Nanoparticle Albumin-Bound Paclitaxel injection What is this medicine? NANOPARTICLE ALBUMIN-BOUND PACLITAXEL (Na no PAHR ti kuhl al BYOO muhn-bound PAK li TAX el) is a chemotherapy drug. It targets fast dividing cells, like cancer cells, and causes these cells to die. This medicine is used to treat advanced breast cancer, lung cancer, and pancreatic cancer. This medicine may be used for other purposes; ask your health care provider or pharmacist if you have questions. COMMON BRAND NAME(S): Abraxane What should I tell my health care provider before I take this medicine? They need to know if you have any of these conditions:  kidney disease  liver disease  low blood counts, like low white cell, platelet, or red cell counts  lung or breathing disease, like asthma  tingling of the fingers or toes, or other nerve disorder  an unusual or allergic reaction to paclitaxel, albumin, other chemotherapy, other medicines, foods, dyes, or preservatives  pregnant or trying to get pregnant  breast-feeding How should I use this medicine? This drug is given as an infusion into a vein. It is administered in a hospital or clinic by a specially trained health care professional. Talk to your pediatrician regarding the use of this medicine in children. Special care may be needed. Overdosage: If you think you have taken too much of this medicine contact a poison control center or emergency room at once. NOTE: This medicine is only for you. Do not share this medicine with others. What if I miss a dose? It is important not to miss your dose. Call your doctor or health care professional if you are unable to keep an appointment. What may interact with this medicine? This medicine may interact with the following medications:  antiviral medicines for hepatitis, HIV or AIDS  certain antibiotics like erythromycin and clarithromycin  certain medicines for fungal infections like ketoconazole and  itraconazole  certain medicines for seizures like carbamazepine, phenobarbital, phenytoin  gemfibrozil  nefazodone  rifampin  St. John's wort This list may not describe all possible interactions. Give your health care provider a list of all the medicines, herbs, non-prescription drugs, or dietary supplements you use. Also tell them if you smoke, drink alcohol, or use illegal drugs. Some items may interact with your medicine. What should I watch for while using this medicine? Your condition will be monitored carefully while you are receiving this medicine. You will need important blood work done while you are taking this medicine. This medicine can cause serious allergic reactions. If you experience allergic reactions like skin rash, itching or hives, swelling of the face, lips, or tongue, tell your doctor or health care professional right away. In some cases, you may be given additional medicines to help with side effects. Follow all directions for their use. This drug may make you feel generally unwell. This is not uncommon, as chemotherapy can affect healthy cells as well as cancer cells. Report any side effects. Continue your course of treatment even though you feel ill unless your doctor tells you to stop. Call your doctor or health care professional for advice if you get a fever, chills or sore throat, or other symptoms of a cold or flu. Do not treat yourself. This drug decreases your body's ability to fight infections. Try to avoid being around people who are sick. This medicine may increase your risk to bruise or bleed. Call your doctor or health care professional if you notice any unusual bleeding. Be careful brushing and flossing your teeth or using a toothpick because you may   get an infection or bleed more easily. If you have any dental work done, tell your dentist you are receiving this medicine. Avoid taking products that contain aspirin, acetaminophen, ibuprofen, naproxen, or  ketoprofen unless instructed by your doctor. These medicines may hide a fever. Do not become pregnant while taking this medicine or for 6 months after stopping it. Women should inform their doctor if they wish to become pregnant or think they might be pregnant. Men should not father a child while taking this medicine or for 3 months after stopping it. There is a potential for serious side effects to an unborn child. Talk to your health care professional or pharmacist for more information. Do not breast-feed an infant while taking this medicine or for 2 weeks after stopping it. This medicine may interfere with the ability to get pregnant or to father a child. You should talk to your doctor or health care professional if you are concerned about your fertility. What side effects may I notice from receiving this medicine? Side effects that you should report to your doctor or health care professional as soon as possible:  allergic reactions like skin rash, itching or hives, swelling of the face, lips, or tongue  breathing problems  changes in vision  fast, irregular heartbeat  low blood pressure  mouth sores  pain, tingling, numbness in the hands or feet  signs of decreased platelets or bleeding - bruising, pinpoint red spots on the skin, black, tarry stools, blood in the urine  signs of decreased red blood cells - unusually weak or tired, feeling faint or lightheaded, falls  signs of infection - fever or chills, cough, sore throat, pain or difficulty passing urine  signs and symptoms of liver injury like dark yellow or brown urine; general ill feeling or flu-like symptoms; light-colored stools; loss of appetite; nausea; right upper belly pain; unusually weak or tired; yellowing of the eyes or skin  swelling of the ankles, feet, hands  unusually slow heartbeat Side effects that usually do not require medical attention (report to your doctor or health care professional if they continue or  are bothersome):  diarrhea  hair loss  loss of appetite  nausea, vomiting  tiredness This list may not describe all possible side effects. Call your doctor for medical advice about side effects. You may report side effects to FDA at 1-800-FDA-1088. Where should I keep my medicine? This drug is given in a hospital or clinic and will not be stored at home. NOTE: This sheet is a summary. It may not cover all possible information. If you have questions about this medicine, talk to your doctor, pharmacist, or health care provider.  2021 Elsevier/Gold Standard (2017-01-29 13:03:45) Gemcitabine injection What is this medicine? GEMCITABINE (jem SYE ta been) is a chemotherapy drug. This medicine is used to treat many types of cancer like breast cancer, lung cancer, pancreatic cancer, and ovarian cancer. This medicine may be used for other purposes; ask your health care provider or pharmacist if you have questions. COMMON BRAND NAME(S): Gemzar, Infugem What should I tell my health care provider before I take this medicine? They need to know if you have any of these conditions:  blood disorders  infection  kidney disease  liver disease  lung or breathing disease, like asthma  recent or ongoing radiation therapy  an unusual or allergic reaction to gemcitabine, other chemotherapy, other medicines, foods, dyes, or preservatives  pregnant or trying to get pregnant  breast-feeding How should I use this medicine?  This drug is given as an infusion into a vein. It is administered in a hospital or clinic by a specially trained health care professional. Talk to your pediatrician regarding the use of this medicine in children. Special care may be needed. Overdosage: If you think you have taken too much of this medicine contact a poison control center or emergency room at once. NOTE: This medicine is only for you. Do not share this medicine with others. What if I miss a dose? It is important  not to miss your dose. Call your doctor or health care professional if you are unable to keep an appointment. What may interact with this medicine?  medicines to increase blood counts like filgrastim, pegfilgrastim, sargramostim  some other chemotherapy drugs like cisplatin  vaccines Talk to your doctor or health care professional before taking any of these medicines:  acetaminophen  aspirin  ibuprofen  ketoprofen  naproxen This list may not describe all possible interactions. Give your health care provider a list of all the medicines, herbs, non-prescription drugs, or dietary supplements you use. Also tell them if you smoke, drink alcohol, or use illegal drugs. Some items may interact with your medicine. What should I watch for while using this medicine? Visit your doctor for checks on your progress. This drug may make you feel generally unwell. This is not uncommon, as chemotherapy can affect healthy cells as well as cancer cells. Report any side effects. Continue your course of treatment even though you feel ill unless your doctor tells you to stop. In some cases, you may be given additional medicines to help with side effects. Follow all directions for their use. Call your doctor or health care professional for advice if you get a fever, chills or sore throat, or other symptoms of a cold or flu. Do not treat yourself. This drug decreases your body's ability to fight infections. Try to avoid being around people who are sick. This medicine may increase your risk to bruise or bleed. Call your doctor or health care professional if you notice any unusual bleeding. Be careful brushing and flossing your teeth or using a toothpick because you may get an infection or bleed more easily. If you have any dental work done, tell your dentist you are receiving this medicine. Avoid taking products that contain aspirin, acetaminophen, ibuprofen, naproxen, or ketoprofen unless instructed by your doctor.  These medicines may hide a fever. Do not become pregnant while taking this medicine or for 6 months after stopping it. Women should inform their doctor if they wish to become pregnant or think they might be pregnant. Men should not father a child while taking this medicine and for 3 months after stopping it. There is a potential for serious side effects to an unborn child. Talk to your health care professional or pharmacist for more information. Do not breast-feed an infant while taking this medicine or for at least 1 week after stopping it. Men should inform their doctors if they wish to father a child. This medicine may lower sperm counts. Talk with your doctor or health care professional if you are concerned about your fertility. What side effects may I notice from receiving this medicine? Side effects that you should report to your doctor or health care professional as soon as possible:  allergic reactions like skin rash, itching or hives, swelling of the face, lips, or tongue  breathing problems  pain, redness, or irritation at site where injected  signs and symptoms of a dangerous change  in heartbeat or heart rhythm like chest pain; dizziness; fast or irregular heartbeat; palpitations; feeling faint or lightheaded, falls; breathing problems  signs of decreased platelets or bleeding - bruising, pinpoint red spots on the skin, black, tarry stools, blood in the urine  signs of decreased red blood cells - unusually weak or tired, feeling faint or lightheaded, falls  signs of infection - fever or chills, cough, sore throat, pain or difficulty passing urine  signs and symptoms of kidney injury like trouble passing urine or change in the amount of urine  signs and symptoms of liver injury like dark yellow or brown urine; general ill feeling or flu-like symptoms; light-colored stools; loss of appetite; nausea; right upper belly pain; unusually weak or tired; yellowing of the eyes or  skin  swelling of ankles, feet, hands Side effects that usually do not require medical attention (report to your doctor or health care professional if they continue or are bothersome):  constipation  diarrhea  hair loss  loss of appetite  nausea  rash  vomiting This list may not describe all possible side effects. Call your doctor for medical advice about side effects. You may report side effects to FDA at 1-800-FDA-1088. Where should I keep my medicine? This drug is given in a hospital or clinic and will not be stored at home. NOTE: This sheet is a summary. It may not cover all possible information. If you have questions about this medicine, talk to your doctor, pharmacist, or health care provider.  2021 Elsevier/Gold Standard (2017-08-21 18:06:11)

## 2020-08-22 NOTE — Progress Notes (Signed)
[place]  Telephone:([phone] [phone]   Name: Ricky Riley Date: 08/22/2020 MRN: [id number]  DOB: May 24, 1941  Patient Care Team: Baxter Hire, MD as PCP - General (Internal Medicine) Cammie Sickle, MD as Consulting Physician (Internal Medicine)    REASON FOR CONSULTATION: Ricky Riley is a 80 y.o. male with multiple medical problems including hypertension, valvular heart disease, sleep apnea not on CPAP, diabetes, and CKD stage IIIa.  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 Patient was hospitalized 08/17/2020 to 08/20/2020 with worsening abdominal pain.  Palliative care was consulted to help address goals and manage ongoing symptoms..   SOCIAL HISTORY:     reports that he quit smoking about 11 years ago. His smoking use included cigarettes. He smoked 1.00 pack per day. He has never used smokeless tobacco. He reports that he does not drink alcohol and does not use drugs.  Patient is married and lives at home with his wife.  Patient has a son and daughter who are involved in his care.  He is retired and previously worked as a Furniture conservator/restorer.  ADVANCE DIRECTIVES:  Not on file  CODE STATUS:   PAST MEDICAL HISTORY: Past Medical History:  Diagnosis Date  . Arthritis   . Cancer associated pain   . Diabetes mellitus without complication ([place])   . Dysrhythmia   . GERD (gastroesophageal reflux disease)   . Headache   . Hypertension   . Sleep apnea    CPAP     PAST SURGICAL HISTORY:  Past Surgical History:  Procedure Laterality Date  . ARTERY BIOPSY Left 03/02/2015   Procedure: BIOPSY TEMPORAL ARTERY;  Surgeon: Clyde Canterbury, MD;  Location: [facility];  Service: ENT;  Laterality: Left;  . HERNIA REPAIR    . LEFT HEART CATH AND CORONARY ANGIOGRAPHY Left 01/27/2020   Procedure: LEFT HEART CATH AND CORONARY ANGIOGRAPHY;  Surgeon: Teodoro Spray, MD;   Location: [facility];  Service: Cardiovascular;  Laterality: Left;  . PENILE PROSTHESIS IMPLANT    . TRANSURETHRAL RESECTION OF PROSTATE    . 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  MEDICATIONS:  Current Outpatient Medications  Medication Sig Dispense Refill  . acetaminophen (TYLENOL) 500 MG tablet Take 500 mg by mouth every 6 (six) hours as needed for moderate pain or headache.    Marland Kitchen aspirin 81 MG EC tablet Take 81 mg by mouth daily.    . cetirizine (ZYRTEC) 10 MG tablet Take 10 mg by mouth daily as needed for allergies.    . finasteride (PROSCAR) 5 MG tablet Take 5 mg by mouth daily.    . fluticasone (FLONASE) 50 MCG/ACT nasal  spray Place 2 sprays into both nostrils daily as needed for rhinitis.    . metFORMIN (GLUCOPHAGE) 500 MG tablet Take 500 mg by mouth daily with breakfast.    . mirtazapine (REMERON) 15 MG tablet Take 15 mg by mouth at bedtime as needed (sleep).     Marland Kitchen omeprazole (PRILOSEC) 20 MG capsule Take 20 mg by mouth daily before breakfast.     . ondansetron (ZOFRAN ODT) 4 MG disintegrating tablet Take 1 tablet (4 mg total) by mouth every 8 (eight) hours as needed for nausea or vomiting.  20 tablet 0  . oxyCODONE (OXY IR/ROXICODONE) 5 MG immediate release tablet Take 1 tablet (5 mg total) by mouth every 6 (six) hours as needed for up to 5 days for moderate pain or severe pain. 20 tablet 0  . senna-docusate (SENOKOT-S) 8.6-50 MG tablet Take 1 tablet by mouth 2 (two) times daily. (Patient taking differently: Take 1 tablet by mouth at bedtime as needed for mild constipation.) 60 tablet 3  . terazosin (HYTRIN) 10 MG capsule Take 10 mg by mouth at bedtime.    . traZODone (DESYREL) 50 MG tablet Take 50 mg by mouth daily as needed for sleep.      No current facility-administered medications for this visit.   Facility-Administered Medications Ordered in Other Visits  Medication Dose Route Frequency Provider Last Rate Last Admin  . 0.9 %  sodium chloride infusion  250 mL Intravenous PRN Teodoro Spray, MD      . 0.9% sodium chloride infusion  1 mL/kg/hr Intravenous Continuous Teodoro Spray, MD      . sodium chloride flush (NS) 0.9 % injection 3 mL  3 mL Intravenous Q12H Teodoro Spray, MD      . sodium chloride flush (NS) 0.9 % injection 3 mL  3 mL Intravenous PRN Teodoro Spray, MD        VITAL SIGNS: There were no vitals taken for this visit. There were no vitals filed for this visit.  Estimated body mass index is 23.12 kg/m as calculated from the following:   Height as of 08/17/20: 6\' 3"  (1.905 m).   Weight as of 08/17/20: 185 lb (83.9 kg).  LABS: CBC:    Component Value Date/Time   WBC 12.2 (H) 08/20/2020 0544   HGB 12.4 (L) 08/20/2020 0544   HCT 36.7 (L) 08/20/2020 0544   PLT 221 08/20/2020 0544   MCV 78.8 (L) 08/20/2020 0544   NEUTROABS 4.7 12/12/2019 0836   LYMPHSABS 0.9 12/12/2019 0836   MONOABS 1.1 (H) 12/12/2019 0836   EOSABS 0.1 12/12/2019 0836   BASOSABS 0.0 12/12/2019 0836   Comprehensive Metabolic Panel:    Component Value Date/Time   NA 135 08/20/2020 0544   K 4.6 08/20/2020 0544   CL 105 08/20/2020 0544   CO2 21 (L) 08/20/2020 0544   BUN 23  08/20/2020 0544   CREATININE 1.43 (H) 08/20/2020 0544   GLUCOSE 130 (H) 08/20/2020 0544   CALCIUM 10.1 08/20/2020 0544   AST 44 (H) 08/20/2020 0544   ALT 36 08/20/2020 0544   ALKPHOS 201 (H) 08/20/2020 0544   BILITOT 1.7 (H) 08/20/2020 0544   PROT 6.6 08/20/2020 0544   ALBUMIN 3.0 (L) 08/20/2020 0544    RADIOGRAPHIC STUDIES: CT ABDOMEN PELVIS W CONTRAST  Result Date: 08/11/2020 CLINICAL DATA:  Bilateral flank pain EXAM: CT ABDOMEN AND PELVIS WITH CONTRAST TECHNIQUE: Multidetector CT imaging of the abdomen and pelvis was performed using the standard protocol following bolus administration of  intravenous contrast. CONTRAST:  152mL OMNIPAQUE IOHEXOL 300 MG/ML  SOLN COMPARISON:  CT 12/12/2019 FINDINGS: Lower chest: Lung bases demonstrate no acute consolidation or effusion. Probable scarring at the right middle lobe and right base. Borderline cardiomegaly. No significant pericardial effusion. Hepatobiliary: Interim development of numerous hypodense hepatic masses concerning for metastatic disease. No calcified gallstone. No biliary dilatation. The gallbladder appears slightly thick walled. Pancreas: No inflammatory changes. Mild pancreatic ductal dilatation. Irregular hypodense mass at the tail of the pancreas measuring 5.8 by 3.1 by 3.4 cm. Spleen: Normal in size without focal abnormality. Adrenals/Urinary Tract: Adrenal glands are within normal limits. Kidneys show bilateral cysts. No hydronephrosis. The urinary bladder is unremarkable. Stomach/Bowel: The stomach is nonenlarged. No dilated small bowel. No acute bowel wall thickening. Negative appendix Vascular/Lymphatic: Mild aortic atherosclerosis. No aneurysmal dilatation. Poorly defined soft tissue density at the gastrohepatic ligament measuring approximately 3 x 1.5 cm concerning for metastatic disease. Periportal lymph nodes measuring up to 18 cm. 13 mm left retroperitoneal lymph node at the level of the SMA. Reproductive: Prostate unremarkable.  Penile prosthesis with left pelvic deflated reservoir. Other: No free air. Small amount of abdominopelvic ascites. Small mesenteric nodules, for example 3 mm nodule anterior to the distal descending colon, series 2, image number 53. Multiple small soft tissue nodules in the right colic gutter. Multiple soft tissue nodules anterior to the left hepatic lobe measuring up to 17 mm, series 7, image number 17. Musculoskeletal: New lucent lesion with peripheral sclerosis in the posterior left ilium, series 2, image number 72 indeterminate for metastatic disease. IMPRESSION: 1. Interim development of numerous hypodense hepatic masses concerning for metastatic disease. 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 2. Gastrohepatic, periportal and retroperitoneal adenopathy also concerning for metastatic disease. 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 3. New lucent lesion with peripheral sclerosis in the posterior left ilium, indeterminate for metastatic disease. Aortic Atherosclerosis (ICD10-I70.0). Electronically Signed   By: Donavan Foil M.D.   On: 08/11/2020 21:50   US BIOPSY (LIVER)  Result Date: 08/16/2020 INDICATION: Pancreatic mass and multiple liver lesions EXAM: Ultrasound-guided liver lesion biopsy MEDICATIONS: None. ANESTHESIA/SEDATION: Moderate (conscious) sedation was employed during this procedure. A total of Versed 1 mg and Fentanyl 50 mcg was administered intravenously. Moderate Sedation Time: The 15 minutes. The patient's level of consciousness and vital signs were monitored continuously by radiology nursing throughout the procedure under my direct supervision. COMPLICATIONS: None immediate. PROCEDURE: Informed written consent was obtained from the patient after a thorough discussion of the procedural risks, benefits and alternatives. All  questions were addressed. Maximal Sterile Barrier Technique was utilized including caps, mask, sterile gowns, sterile gloves, sterile drape, hand hygiene and skin antiseptic. A timeout was performed prior to the initiation of the procedure. Patient position left lateral decubitus on the ultrasound table. Right upper quadrant skin prepped and draped in usual sterile fashion. Following local lidocaine administration, 17 gauge introducer needle was advanced into 1 of the right liver lesions, and four 18 gauge cores were obtained utilizing continuous ultrasound guidance. Gelfoam slurry was administered through the introducer needle at the biopsy site. Samples were sent to pathology in formalin. Needle removed and hemostasis achieved with 5 minutes of manual compression. Post procedure ultrasound images showed no evidence of significant hemorrhage. IMPRESSION: Ultrasound-guided biopsy of right liver mass. Electronically Signed   By: Miachel Roux M.D.   On: 08/16/2020 10:34  DG Abd 2 Views  Result Date: 08/18/2020 CLINICAL DATA:  Constipation due to opiate therapy. EXAM: ABDOMEN - 2 VIEW COMPARISON:  CT 08/11/2020 FINDINGS: No free intra-abdominal air moderate stool in the ascending, transverse, proximal descending colon. Small volume of stool in the distal descending and sigmoid colon. Enteric contrast from prior abdominal CT is present in the left colon. No small bowel dilatation or evidence of obstruction. 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 Linear right basilar atelectasis. IMPRESSION: Moderate stool in the colon. No evidence of bowel obstruction. Previous enteric contrast from prior abdominal CT is present in the left colon. Electronically Signed   By: Keith Rake M.D.   On: 08/18/2020 20:04    PERFORMANCE STATUS (ECOG) : 1 - Symptomatic but completely ambulatory  Review of Systems Unless otherwise noted, a complete review of systems is negative.  Physical Exam General: NAD HEENT: Thrush on tongue and  posterior OP Cardiovascular: regular rate and rhythm Pulmonary: clear ant fields Abdomen: soft, nontender, + bowel sounds GU: no suprapubic tenderness Extremities: no edema, no joint deformities Skin: no rashes Neurological: Weakness but otherwise nonfocal  IMPRESSION: Patient was an add-on to my clinic schedule today for evaluation management of abdominal pain.  Patient was accompanied by his wife.   Patient continues to endorse persistent diffuse sharp abdominal pain worse in upper abdomen, which radiates in a band around to back.  Pain is not characteristically different from when he was hospitalized.  Patient currently rates pain as 9 out of 10.  He is taking oxycodone but infrequently.  He says that he took oxycodone 3 times yesterday but has not taken any today.  Patient says that with oxycodone, pain will drop from 9 out of 10 to 5 out of 10.  However, he says that that is still intolerable.  He would rate level of tolerability at 3 or 4 out of 10.  We did discuss the nature of pain likely this really related to his pancreatic and liver tumors.  It is unlikely that we will be able to find a regimen whereby patient is entirely relieved of pain.  However, we can aim to lessen pain, make it tolerable, and maximize his quality of life.  We will plan to liberalize dosing and frequency of oxycodone.  I am unsure if patient will be able to fully comply with around-the-clock dosing of short acting opioids.  Therefore, I will start him on low-dose transdermal fentanyl for long-acting control.  Patient seems to be tolerating oxycodone well and denies any adverse effects.  He was having some visual hallucinations on Norco.  We will also plan to refer to interventional pain management for consideration of celiac plexus block.  If this were effective, it may mean that we are able to lessen opioid pain meds and minimize their side effects.  Patient does endorse constipation.  Last bowel movement was  Saturday when he was in the hospital.  Patient is not regularly taking laxatives or bowel regimen at home.    His oral intake is minimal.  He is drinking very little.  Patient does appear to have oral thrush on exam.  We will start him on nystatin swish and swallow.  We will also give him IV fluids today as labs appear consistent with dehydration.  No nausea or vomiting.  No fever or chills.  No urinary urgency or frequency.  He does describe dark-colored urine.  He is passing gas.    PLAN: -Plan is to initiate treatment later this week with  systemic chemotherapy -Start transdermal fentanyl 12 mcg every 72 hours #5 -Increase oxycodone 5 to 10 mg every 4 hours as needed for breakthrough pain #60 -Daily bowel regimen with senna/MiraLAX -Lactulose 15 mL twice daily if above regimen is ineffective at producing a bowel movement daily or every other day -Nystatin swish and swallow every 6 hours x7 days -IV fluids today -Referral to nutrition -Recommend oral nutritional supplement 2-3 times daily and maximize high-protein/high-calorie foods -Referral for interventional pain management -Referral for home-based palliative care -Consider [facility] transport -RTC later this week  Case and plan discussed with Dr. Rogue Bussing and Dr. Dossie Arbour   Patient expressed understanding and was in agreement with this plan. He also understands that He can call the clinic at any time with any questions, concerns, or complaints.     Time Total: 40 minutes  Visit consisted of counseling and education dealing with the complex and emotionally intense issues of symptom management and palliative care in the setting of serious and potentially life-threatening illness.Greater than 50%  of this time was spent counseling and coordinating care related to the above assessment and plan.  Signed by: Altha Harm, PhD, NP-C

## 2020-08-22 NOTE — Addendum Note (Signed)
Addended by: Delice Bison E on: 08/22/2020 08:21 AM   Modules accepted: Orders

## 2020-08-22 NOTE — Telephone Encounter (Signed)
Port Placement Fri 08/26/20 at 10:30 a Arrive 9:30a

## 2020-08-23 ENCOUNTER — Inpatient Hospital Stay: Payer: Medicare Other

## 2020-08-23 ENCOUNTER — Other Ambulatory Visit: Payer: Self-pay | Admitting: *Deleted

## 2020-08-23 DIAGNOSIS — G893 Neoplasm related pain (acute) (chronic): Secondary | ICD-10-CM

## 2020-08-23 DIAGNOSIS — C259 Malignant neoplasm of pancreas, unspecified: Secondary | ICD-10-CM

## 2020-08-23 DIAGNOSIS — K769 Liver disease, unspecified: Secondary | ICD-10-CM

## 2020-08-24 ENCOUNTER — Inpatient Hospital Stay: Payer: Medicare Other

## 2020-08-24 ENCOUNTER — Other Ambulatory Visit: Payer: Self-pay

## 2020-08-24 ENCOUNTER — Encounter: Payer: Self-pay | Admitting: Internal Medicine

## 2020-08-24 ENCOUNTER — Inpatient Hospital Stay (HOSPITAL_BASED_OUTPATIENT_CLINIC_OR_DEPARTMENT_OTHER): Payer: Medicare Other | Admitting: Internal Medicine

## 2020-08-24 ENCOUNTER — Inpatient Hospital Stay
Admission: EM | Admit: 2020-08-24 | Discharge: 2020-08-26 | DRG: 948 | Disposition: A | Discharge status: Hospice | Payer: Medicare Other | Source: Ambulatory Visit | Attending: Internal Medicine | Admitting: Internal Medicine

## 2020-08-24 ENCOUNTER — Inpatient Hospital Stay: Payer: Medicare Other | Admitting: Hospice and Palliative Medicine

## 2020-08-24 DIAGNOSIS — C252 Malignant neoplasm of tail of pancreas: Secondary | ICD-10-CM

## 2020-08-24 DIAGNOSIS — E1122 Type 2 diabetes mellitus with diabetic chronic kidney disease: Secondary | ICD-10-CM

## 2020-08-24 DIAGNOSIS — I129 Hypertensive chronic kidney disease with stage 1 through stage 4 chronic kidney disease, or unspecified chronic kidney disease: Secondary | ICD-10-CM | POA: Diagnosis present

## 2020-08-24 DIAGNOSIS — Z833 Family history of diabetes mellitus: Secondary | ICD-10-CM

## 2020-08-24 DIAGNOSIS — G893 Neoplasm related pain (acute) (chronic): Principal | ICD-10-CM | POA: Diagnosis present

## 2020-08-24 DIAGNOSIS — Z880 Allergy status to penicillin: Secondary | ICD-10-CM

## 2020-08-24 DIAGNOSIS — C787 Secondary malignant neoplasm of liver and intrahepatic bile duct: Secondary | ICD-10-CM | POA: Diagnosis present

## 2020-08-24 DIAGNOSIS — K5903 Drug induced constipation: Secondary | ICD-10-CM

## 2020-08-24 DIAGNOSIS — Z8249 Family history of ischemic heart disease and other diseases of the circulatory system: Secondary | ICD-10-CM

## 2020-08-24 DIAGNOSIS — N401 Enlarged prostate with lower urinary tract symptoms: Secondary | ICD-10-CM | POA: Diagnosis present

## 2020-08-24 DIAGNOSIS — N1832 Chronic kidney disease, stage 3b: Secondary | ICD-10-CM | POA: Diagnosis present

## 2020-08-24 DIAGNOSIS — X58XXXA Exposure to other specified factors, initial encounter: Secondary | ICD-10-CM | POA: Diagnosis present

## 2020-08-24 DIAGNOSIS — Z87891 Personal history of nicotine dependence: Secondary | ICD-10-CM

## 2020-08-24 DIAGNOSIS — R338 Other retention of urine: Secondary | ICD-10-CM | POA: Diagnosis present

## 2020-08-24 DIAGNOSIS — N183 Chronic kidney disease, stage 3 unspecified: Secondary | ICD-10-CM

## 2020-08-24 DIAGNOSIS — Z20822 Contact with and (suspected) exposure to covid-19: Secondary | ICD-10-CM | POA: Diagnosis present

## 2020-08-24 DIAGNOSIS — Z79899 Other long term (current) drug therapy: Secondary | ICD-10-CM

## 2020-08-24 DIAGNOSIS — E871 Hypo-osmolality and hyponatremia: Secondary | ICD-10-CM | POA: Diagnosis present

## 2020-08-24 DIAGNOSIS — Z882 Allergy status to sulfonamides status: Secondary | ICD-10-CM

## 2020-08-24 DIAGNOSIS — Z888 Allergy status to other drugs, medicaments and biological substances status: Secondary | ICD-10-CM

## 2020-08-24 DIAGNOSIS — G4730 Sleep apnea, unspecified: Secondary | ICD-10-CM | POA: Diagnosis present

## 2020-08-24 DIAGNOSIS — Z841 Family history of disorders of kidney and ureter: Secondary | ICD-10-CM

## 2020-08-24 DIAGNOSIS — T402X5A Adverse effect of other opioids, initial encounter: Secondary | ICD-10-CM

## 2020-08-24 DIAGNOSIS — K219 Gastro-esophageal reflux disease without esophagitis: Secondary | ICD-10-CM | POA: Diagnosis present

## 2020-08-24 DIAGNOSIS — Z515 Encounter for palliative care: Secondary | ICD-10-CM

## 2020-08-24 DIAGNOSIS — Z7984 Long term (current) use of oral hypoglycemic drugs: Secondary | ICD-10-CM

## 2020-08-24 DIAGNOSIS — Z66 Do not resuscitate: Secondary | ICD-10-CM | POA: Diagnosis present

## 2020-08-24 LAB — COMPREHENSIVE METABOLIC PANEL
ALT: 33 U/L (ref 0–44)
ALT: 34 U/L (ref 0–44)
AST: 53 U/L — ABNORMAL HIGH (ref 15–41)
AST: 52 U/L — ABNORMAL HIGH (ref 15–41)
Albumin: 3.3 g/dL — ABNORMAL LOW (ref 3.5–5.0)
Albumin: 3.2 g/dL — ABNORMAL LOW (ref 3.5–5.0)
Alkaline Phosphatase: 204 U/L — ABNORMAL HIGH (ref 38–126)
Alkaline Phosphatase: 204 U/L — ABNORMAL HIGH (ref 38–126)
Anion gap: 11 (ref 5–15)
Anion gap: 9 (ref 5–15)
BUN: 37 mg/dL — ABNORMAL HIGH (ref 8–23)
BUN: 38 mg/dL — ABNORMAL HIGH (ref 8–23)
CO2: 20 mmol/L — ABNORMAL LOW (ref 22–32)
CO2: 21 mmol/L — ABNORMAL LOW (ref 22–32)
Calcium: 10.4 mg/dL — ABNORMAL HIGH (ref 8.9–10.3)
Calcium: 10.4 mg/dL — ABNORMAL HIGH (ref 8.9–10.3)
Chloride: 103 mmol/L (ref 98–111)
Chloride: 101 mmol/L (ref 98–111)
Creatinine, Ser: 1.55 mg/dL — ABNORMAL HIGH (ref 0.61–1.24)
Creatinine, Ser: 1.67 mg/dL — ABNORMAL HIGH (ref 0.61–1.24)
GFR, Estimated: 46 mL/min — ABNORMAL LOW (ref >60)
GFR, Estimated: 42 mL/min — ABNORMAL LOW (ref >60)
Glucose, Bld: 148 mg/dL — ABNORMAL HIGH (ref 70–99)
Glucose, Bld: 144 mg/dL — ABNORMAL HIGH (ref 70–99)
Potassium: 4.5 mmol/L (ref 3.5–5.1)
Potassium: 4.7 mmol/L (ref 3.5–5.1)
Sodium: 134 mmol/L — ABNORMAL LOW (ref 135–145)
Sodium: 131 mmol/L — ABNORMAL LOW (ref 135–145)
Total Bilirubin: 1.6 mg/dL — ABNORMAL HIGH (ref 0.3–1.2)
Total Bilirubin: 1.5 mg/dL — ABNORMAL HIGH (ref 0.3–1.2)
Total Protein: 6.9 g/dL (ref 6.5–8.1)
Total Protein: 6.8 g/dL (ref 6.5–8.1)

## 2020-08-24 LAB — CBC
HCT: 37.8 % — ABNORMAL LOW (ref 39.0–52.0)
Hemoglobin: 12.3 g/dL — ABNORMAL LOW (ref 13.0–17.0)
MCH: 25.9 pg — ABNORMAL LOW (ref 26.0–34.0)
MCHC: 32.5 g/dL (ref 30.0–36.0)
MCV: 79.7 fL — ABNORMAL LOW (ref 80.0–100.0)
Platelets: 270 10*3/uL (ref 150–400)
RBC: 4.74 MIL/uL (ref 4.22–5.81)
RDW: 13.2 % (ref 11.5–15.5)
WBC: 15.7 10*3/uL — ABNORMAL HIGH (ref 4.0–10.5)
nRBC: 0 % (ref 0.0–0.2)

## 2020-08-24 LAB — CBC WITH DIFFERENTIAL/PLATELET
Abs Immature Granulocytes: 0.13 10*3/uL — ABNORMAL HIGH (ref 0.00–0.07)
Basophils Absolute: 0.1 10*3/uL (ref 0.0–0.1)
Basophils Relative: 1 %
Eosinophils Absolute: 0.4 10*3/uL (ref 0.0–0.5)
Eosinophils Relative: 2 %
HCT: 38.9 % — ABNORMAL LOW (ref 39.0–52.0)
Hemoglobin: 12.8 g/dL — ABNORMAL LOW (ref 13.0–17.0)
Immature Granulocytes: 1 %
Lymphocytes Relative: 6 %
Lymphs Abs: 1.1 10*3/uL (ref 0.7–4.0)
MCH: 25.9 pg — ABNORMAL LOW (ref 26.0–34.0)
MCHC: 32.9 g/dL (ref 30.0–36.0)
MCV: 78.6 fL — ABNORMAL LOW (ref 80.0–100.0)
Monocytes Absolute: 1.7 10*3/uL — ABNORMAL HIGH (ref 0.1–1.0)
Monocytes Relative: 10 %
Neutro Abs: 14 10*3/uL — ABNORMAL HIGH (ref 1.7–7.7)
Neutrophils Relative %: 80 %
Platelets: 274 10*3/uL (ref 150–400)
RBC: 4.95 MIL/uL (ref 4.22–5.81)
RDW: 13 % (ref 11.5–15.5)
WBC: 17.4 10*3/uL — ABNORMAL HIGH (ref 4.0–10.5)
nRBC: 0 % (ref 0.0–0.2)

## 2020-08-24 LAB — LIPASE, BLOOD: Lipase: 20 U/L (ref 11–51)

## 2020-08-24 MED ORDER — FENTANYL 12 MCG/HR TD PT72
1.0000 | MEDICATED_PATCH | TRANSDERMAL | Status: DC
Start: 2020-08-24 — End: 2020-08-24
  Filled 2020-08-24: qty 1

## 2020-08-24 MED ORDER — MORPHINE SULFATE (PF) 2 MG/ML IV SOLN
2.0000 mg | INTRAVENOUS | Status: DC | PRN
  Administered 2020-08-24 – 2020-08-26 (×13): 2 mg via INTRAVENOUS
  Filled 2020-08-24 (×14): qty 1

## 2020-08-24 MED ORDER — ONDANSETRON HCL 4 MG/2ML IJ SOLN: 4.0000 mg | Freq: Four times a day (QID) | INTRAMUSCULAR | Status: DC | PRN

## 2020-08-24 MED ORDER — FINASTERIDE 5 MG PO TABS
5.0000 mg | ORAL_TABLET | Freq: Every day | ORAL | Status: DC
  Administered 2020-08-25: 09:00:00 5 mg via ORAL
  Filled 2020-08-24 (×2): qty 1

## 2020-08-24 MED ORDER — FLUTICASONE PROPIONATE 50 MCG/ACT NA SUSP
2.0000 | Freq: Every day | NASAL | Status: DC | PRN
  Filled 2020-08-24: qty 16

## 2020-08-24 MED ORDER — BISACODYL 10 MG RE SUPP
10.0000 mg | Freq: Once | RECTAL | Status: AC
  Administered 2020-08-24: 17:00:00 10 mg via RECTAL
  Filled 2020-08-24 (×2): qty 1

## 2020-08-24 MED ORDER — ONDANSETRON HCL 4 MG PO TABS: 4.0000 mg | ORAL_TABLET | Freq: Four times a day (QID) | ORAL | Status: DC | PRN

## 2020-08-24 MED ORDER — TERAZOSIN HCL 5 MG PO CAPS
10.0000 mg | ORAL_CAPSULE | Freq: Every day | ORAL | Status: DC
  Administered 2020-08-24 – 2020-08-25 (×2): 10 mg via ORAL
  Filled 2020-08-24 (×4): qty 2

## 2020-08-24 MED ORDER — SENNOSIDES-DOCUSATE SODIUM 8.6-50 MG PO TABS
2.0000 | ORAL_TABLET | Freq: Every day | ORAL | Status: DC
  Administered 2020-08-24: 22:00:00 2 via ORAL
  Filled 2020-08-24: qty 2

## 2020-08-24 MED ORDER — ENOXAPARIN SODIUM 40 MG/0.4ML ~~LOC~~ SOLN
40.0000 mg | SUBCUTANEOUS | Status: DC
  Administered 2020-08-24 – 2020-08-25 (×2): 40 mg via SUBCUTANEOUS
  Filled 2020-08-24 (×2): qty 0.4

## 2020-08-24 MED ORDER — GLUCERNA SHAKE PO LIQD
237.0000 mL | Freq: Three times a day (TID) | ORAL | Status: DC
  Administered 2020-08-24 – 2020-08-26 (×5): 237 mL via ORAL

## 2020-08-24 MED ORDER — LACTATED RINGERS IV BOLUS
1000.0000 mL | Freq: Once | INTRAVENOUS | Status: AC
  Administered 2020-08-24: 1000 mL via INTRAVENOUS

## 2020-08-24 MED ORDER — PANTOPRAZOLE SODIUM 40 MG PO TBEC
40.0000 mg | DELAYED_RELEASE_TABLET | Freq: Every day | ORAL | Status: DC
  Administered 2020-08-25: 09:00:00 40 mg via ORAL
  Filled 2020-08-24 (×2): qty 1

## 2020-08-24 MED ORDER — SODIUM CHLORIDE 0.9 % IV SOLN: INTRAVENOUS | Status: DC

## 2020-08-24 MED ORDER — LORATADINE 10 MG PO TABS
10.0000 mg | ORAL_TABLET | Freq: Every day | ORAL | Status: DC
  Administered 2020-08-25: 09:00:00 10 mg via ORAL
  Filled 2020-08-24 (×2): qty 1

## 2020-08-24 MED ORDER — FENTANYL CITRATE (PF) 100 MCG/2ML IJ SOLN
75.0000 ug | Freq: Once | INTRAMUSCULAR | Status: AC
  Administered 2020-08-24: 75 ug via INTRAVENOUS
  Filled 2020-08-24: qty 2

## 2020-08-24 MED ORDER — SALINE SPRAY 0.65 % NA SOLN
2.0000 | Freq: Every day | NASAL | Status: DC
  Administered 2020-08-25: 10:00:00 2 via NASAL
  Filled 2020-08-24: qty 44

## 2020-08-24 MED ORDER — MIRTAZAPINE 15 MG PO TABS
15.0000 mg | ORAL_TABLET | Freq: Every evening | ORAL | Status: DC | PRN
Start: 2020-08-24 — End: 2020-08-26
  Filled 2020-08-24: qty 1

## 2020-08-24 MED ORDER — FENTANYL CITRATE (PF) 100 MCG/2ML IJ SOLN
50.0000 ug | Freq: Once | INTRAMUSCULAR | Status: AC
Start: 2020-08-25 — End: 2020-08-24
  Administered 2020-08-24: 50 ug via INTRAVENOUS
  Filled 2020-08-24: qty 2

## 2020-08-24 MED ORDER — ONDANSETRON HCL 4 MG/2ML IJ SOLN
4.0000 mg | Freq: Once | INTRAMUSCULAR | Status: AC
  Administered 2020-08-24: 4 mg via INTRAVENOUS
  Filled 2020-08-24: qty 2

## 2020-08-24 MED ORDER — MORPHINE SULFATE (PF) 4 MG/ML IV SOLN
6.0000 mg | Freq: Once | INTRAVENOUS | Status: AC
  Administered 2020-08-24: 6 mg via INTRAVENOUS
  Filled 2020-08-24: qty 2

## 2020-08-24 MED ORDER — ASPIRIN EC 81 MG PO TBEC
81.0000 mg | DELAYED_RELEASE_TABLET | Freq: Every day | ORAL | Status: DC
  Administered 2020-08-25: 09:00:00 81 mg via ORAL
  Filled 2020-08-24 (×2): qty 1

## 2020-08-24 MED ORDER — FENTANYL 12 MCG/HR TD PT72
1.0000 | MEDICATED_PATCH | TRANSDERMAL | Status: DC
Start: 2020-08-24 — End: 2020-08-26
  Administered 2020-08-24: 1 via TRANSDERMAL
  Filled 2020-08-24: qty 1

## 2020-08-24 MED ORDER — NYSTATIN 100000 UNIT/ML MT SUSP
5.0000 mL | Freq: Four times a day (QID) | OROMUCOSAL | Status: DC
  Administered 2020-08-24 – 2020-08-25 (×5): 500000 [IU] via ORAL
  Filled 2020-08-24 (×6): qty 5

## 2020-08-24 MED ORDER — LACTULOSE 10 GM/15ML PO SOLN: 10.0000 g | Freq: Two times a day (BID) | ORAL | Status: DC | PRN

## 2020-08-24 MED ORDER — ACETAMINOPHEN 500 MG PO TABS: 500.0000 mg | ORAL_TABLET | Freq: Four times a day (QID) | ORAL | Status: DC | PRN

## 2020-08-24 MED ORDER — TRAZODONE HCL 50 MG PO TABS
50.0000 mg | ORAL_TABLET | Freq: Every evening | ORAL | Status: DC | PRN
  Administered 2020-08-25: 01:00:00 50 mg via ORAL
  Filled 2020-08-24: qty 1

## 2020-08-24 NOTE — ED Notes (Signed)
Pt given water at this time for "dry mouth".

## 2020-08-24 NOTE — H&P (Signed)
History and Physical    NISHANTH MCCAUGHAN MPN:[id number] DOB: 01/05/1941 DOA: 08/24/2020  PCP: Baxter Hire, MD   Patient coming from: Home  I have personally briefly reviewed patient's old medical records in [place]  Chief Complaint: Abdominal pain  HPI: Ricky Riley is a 80 y.o. 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  Pain is mostly in the right lower quadrant.  He rates his pain an 8 x 10 in intensity at its worst with radiation to his back.  Pain is associated with constipation as well as anorexia.  He has generalized weakness as well as nausea but no vomiting.  He has minimal relief with oxycodone that was prescribed for him upon discharge a couple of days ago.  Patient was also discharged home with a prescription for fentanyl patch which they had not gotten from the pharmacy. He was recently hospitalized for refractory abdominal pain and was discharged 4 days prior to this admission.  His family states that his pain is unbearable and they would really want his pain to be better controlled so he can start chemotherapy. He denies having any chest pain, no shortness of breath, no fever, no chills, no cough, no dizziness, no lightheadedness, no nocturia, no dysuria, no headache, no blurred vision, no focal deficits Labs show sodium 131, potassium 4.7, chloride 101, bicarb 21, glucose 144, BUN 38, creatinine 1.67, calcium 10.4, alkaline phosphatase 204, albumin 3.2, lipase 20, AST 52, ALT 34, total protein 6.8, total bilirubin 1.5, white count 15.7, hemoglobin 12.3, hematocrit 37.8, MCV 79.7, RDW 13.2, platelet count 270 His SARS coronavirus 2 point-of-care test is pending Twelve-lead EKG reviewed by me shows sinus rhythm with premature atrial  complexes.   ED Course: Patient is a 80 year old African-American male who presents to the ER with his wife and daughter for evaluation of refractory abdominal pain related to recent diagnosis of metastatic pancreatic cancer.  Patient was recently discharged from the hospital for same and has not been able to fill the fentanyl patches that were ordered.  He will be referred to observation status for further evaluation.   Review of Systems: As per HPI otherwise all other systems reviewed and negative.    Past Medical History:  Diagnosis Date  . Arthritis   . Cancer associated pain   . Diabetes mellitus without complication ([place])   . Dysrhythmia   . GERD (gastroesophageal reflux disease)   . Headache   . Hypertension   . Sleep apnea    CPAP     Past Surgical History:  Procedure Laterality Date  . ARTERY BIOPSY Left 03/02/2015   Procedure: BIOPSY TEMPORAL ARTERY;  Surgeon: Clyde Canterbury, MD;  Location: [facility];  Service: ENT;  Laterality: Left;  . HERNIA REPAIR    . LEFT HEART CATH AND CORONARY ANGIOGRAPHY Left 01/27/2020   Procedure: LEFT HEART CATH AND CORONARY ANGIOGRAPHY;  Surgeon: Teodoro Spray, MD;  Location: [facility];  Service: Cardiovascular;  Laterality: Left;  . PENILE PROSTHESIS IMPLANT    . TRANSURETHRAL RESECTION OF PROSTATE    . XI ROBOTIC ASSISTED INGUINAL HERNIA REPAIR WITH MESH Left 04/22/2019   Procedure: XI ROBOTIC ASSISTED INGUINAL HERNIA REPAIR WITH MESH;  Surgeon: Herbert Pun, MD;  Location: [facility];  Service: General;  Laterality: Left;  reports that he quit smoking about 11 years ago. His smoking use included cigarettes. He smoked 1.00 pack per day. He has never used smokeless tobacco. He reports that he does not drink alcohol and does not use drugs.  Allergies  Allergen Reactions  . Atorvastatin Other (See Comments)  . Flunisolide   . Lisinopril Hives  . Rosuvastatin     Other reaction(s): Cramp in lower limb  . Ciprofloxacin  Hives  . Penicillins Hives    Did it involve swelling of the face/tongue/throat, SOB, or low BP? No Did it involve sudden or severe rash/hives, skin peeling, or any reaction on the inside of your mouth or nose? Unknown Did you need to seek medical attention at a hospital or doctor's office? No When did it last happen?30-40 years ago If all above answers are "NO", may proceed with cephalosporin use.   . Sulfa Antibiotics Hives  . Sulfasalazine Hives    Family History  Problem Relation Age of Onset  . Diabetes Mother   . Heart disease Mother   . Diabetes Father   . Heart disease Father   . Cancer Father 50  . Diabetes Sister   . Diabetes Brother   . Kidney failure Brother       Prior to Admission medications   Medication Sig Start Date End Date Taking? Authorizing Provider  acetaminophen (TYLENOL) 500 MG tablet Take 500 mg by mouth every 6 (six) hours as needed for moderate pain or headache.   Yes [provider]  cetirizine (ZYRTEC) 10 MG tablet Take 10 mg by mouth daily as needed for allergies.   Yes [provider]  fentaNYL (DURAGESIC) 12 MCG/HR Place 1 patch onto the skin every 3 (three) days. 08/22/20  Yes Borders, Kirt Boys, NP  finasteride (PROSCAR) 5 MG tablet Take 5 mg by mouth daily.   Yes [provider]  fluticasone (FLONASE) 50 MCG/ACT nasal spray Place 2 sprays into both nostrils daily as needed for rhinitis. 02/08/20 02/07/21 Yes [provider]  hydrochlorothiazide (HYDRODIURIL) 25 MG tablet TAKE ONE-HALF TABLET BY MOUTH ONCE EVERY DAY FOR BLOOD PRESSURE 07/01/20  Yes [provider]  Lactulose 20 GM/30ML SOLN Take 15 mLs (10 g total) by mouth 2 (two) times daily as needed (constipation). 08/22/20  Yes Borders, Kirt Boys, NP  losartan (COZAAR) 25 MG tablet TAKE ONE TABLET BY MOUTH EVERY MORNING FOR BLOOD PRESSURE 08/17/19  Yes [provider]  Menthol-Methyl Salicylate (THERA-GESIC) 0.5-15 % CREA APPLY MODERATE AMOUNT  TOPICALLY 3 TIMES A DAY AS NEEDED FOR PAIN ( AVOID FACE and EYES, [place] HANDS AFTER USING). 08/17/19  Yes [provider]  metFORMIN (GLUCOPHAGE) 500 MG tablet Take 500 mg by mouth daily with breakfast.   Yes [provider]  mirtazapine (REMERON) 15 MG tablet Take 15 mg by mouth at bedtime as needed (sleep).  08/21/10  Yes [provider]  nystatin (MYCOSTATIN) 100000 UNIT/ML suspension Take 5 mLs (500,000 Units total) by mouth 4 (four) times daily. 08/22/20  Yes Borders, Kirt Boys, NP  omeprazole (PRILOSEC) 20 MG capsule Take 20 mg by mouth daily before breakfast.    Yes [provider]  ondansetron (ZOFRAN ODT) 4 MG disintegrating tablet Take 1 tablet (4 mg total) by mouth every 8 (eight) hours as needed for nausea or vomiting. 08/14/20  Yes Naaman Plummer, MD  oxyCODONE (OXY IR/ROXICODONE) 5 MG immediate release tablet Take 1-2 tablets (5-10 mg total) by mouth every 4 (four) hours as needed for moderate  pain or severe pain. 08/22/20  Yes Borders, Kirt Boys, NP  senna-docusate (SENOKOT-S) 8.6-50 MG tablet Take 1 tablet by mouth 2 (two) times daily. 08/15/20  Yes Cammie Sickle, MD  sodium chloride (OCEAN) 0.65 % nasal spray SPRAY 2 SPRAYS INTO EACH NOSTRIL EVERY MORNING 08/17/19  Yes [provider]  terazosin (HYTRIN) 10 MG capsule Take 10 mg by mouth at bedtime.   Yes [provider]  traZODone (DESYREL) 50 MG tablet Take 50 mg by mouth daily as needed for sleep.  01/17/20  Yes [provider]  aspirin 81 MG EC tablet Take 81 mg by mouth daily. Patient not taking: Reported on 08/24/2020 02/03/19   [provider]    Physical Exam: Vitals:   08/24/20 1006 08/24/20 1200 08/24/20 1330 08/24/20 1530  BP: 130/83 106/68 134/76 107/70  Pulse: 86 85 81 83  Resp: 18 19 15  (!) 22  Temp: 97.6 F (36.4 C)     TempSrc: Oral     SpO2: 95% 94% (!) 87% 96%  Weight: 83 kg     Height: 6\' 3"  (1.905 m)        Vitals:   08/24/20 1006  08/24/20 1200 08/24/20 1330 08/24/20 1530  BP: 130/83 106/68 134/76 107/70  Pulse: 86 85 81 83  Resp: 18 19 15  (!) 22  Temp: 97.6 F (36.4 C)     TempSrc: Oral     SpO2: 95% 94% (!) 87% 96%  Weight: 83 kg     Height: 6\' 3"  (1.905 m)         Constitutional: Alert and oriented x 2 . Not in any apparent distress.  Chronically ill-appearing HEENT:      Head: Normocephalic and atraumatic.         Eyes: PERLA, EOMI, Conjunctivae pallor. Sclera is non-icteric.       Mouth/Throat: Mucous membranes are dry.       Neck: Supple with no signs of meningismus. Cardiovascular: Regular rate and rhythm. No murmurs, gallops, or rubs. 2+ symmetrical distal pulses are present . No JVD. No LE edema Respiratory: Respiratory effort normal .Lungs sounds clear bilaterally. No wheezes, crackles, or rhonchi.  Gastrointestinal: Soft, tender right lower quadrant, and non distended with positive bowel sounds.  Genitourinary: No CVA tenderness. Musculoskeletal: Nontender with normal range of motion in all extremities. No cyanosis, or erythema of extremities. Neurologic:  Face is symmetric. Moving all extremities. No gross focal neurologic deficits  Skin: Skin is warm, dry.  No rash or ulcers Psychiatric: Mood and affect are normal   Labs on Admission: I have personally reviewed following labs and imaging studies  CBC: Recent Labs  Lab 08/19/20 0546 08/20/20 0544 08/22/20 1322 08/24/20 0859 08/24/20 1051  WBC 11.2* 12.2* 14.9* 17.4* 15.7*  NEUTROABS  --   --  11.9* 14.0*  --   HGB 12.1* 12.4* 12.9* 12.8* 12.3*  HCT 36.9* 36.7* 39.7 38.9* 37.8*  MCV 79.4* 78.8* 80.2 78.6* 79.7*  PLT 198 221 274 274 209   Basic Metabolic Panel: Recent Labs  Lab 08/19/20 0546 08/20/20 0544 08/22/20 1322 08/24/20 0859 08/24/20 1051  NA 132* 135 131* 134* 131*  K 4.4 4.6 4.7 4.5 4.7  CL 102 105 98 103 101  CO2 22 21* 23 20* 21*  GLUCOSE 120* 130* 152* 148* 144*  BUN 23 23 34* 37* 38*  CREATININE 1.52* 1.43*  1.72* 1.55* 1.67*  CALCIUM 10.0 10.1 10.3 10.4* 10.4*   GFR: Estimated Creatinine Clearance: 43.5 mL/min (A) (  by C-G formula based on SCr of 1.67 mg/dL (H)). Liver Function Tests: Recent Labs  Lab 08/19/20 0546 08/20/20 0544 08/22/20 1322 08/24/20 0859 08/24/20 1051  AST 49* 44* 50* 53* 52*  ALT 41 36 36 33 34  ALKPHOS 194* 201* 217* 204* 204*  BILITOT 1.5* 1.7* 1.6* 1.6* 1.5*  PROT 6.3* 6.6 7.2 6.9 6.8  ALBUMIN 3.0* 3.0* 3.3* 3.3* 3.2*   Recent Labs  Lab 08/17/20 1705 08/24/20 1051  LIPASE 22 20   No results for input(s): AMMONIA in the last 168 hours. Coagulation Profile: No results for input(s): INR, PROTIME in the last 168 hours. Cardiac Enzymes: No results for input(s): CKTOTAL, CKMB, CKMBINDEX, TROPONINI in the last 168 hours. BNP (last 3 results) No results for input(s): PROBNP in the last 8760 hours. HbA1C: No results for input(s): HGBA1C in the last 72 hours. CBG: Recent Labs  Lab 08/19/20 1132 08/19/20 1656 08/19/20 2014 08/20/20 0644 08/20/20 1126  GLUCAP 138* 113* 124* 138* 171*   Lipid Profile: No results for input(s): CHOL, HDL, LDLCALC, TRIG, CHOLHDL, LDLDIRECT in the last 72 hours. Thyroid Function Tests: No results for input(s): TSH, T4TOTAL, FREET4, T3FREE, THYROIDAB in the last 72 hours. Anemia Panel: No results for input(s): VITAMINB12, FOLATE, FERRITIN, TIBC, IRON, RETICCTPCT in the last 72 hours. Urine analysis:    Component Value Date/Time   COLORURINE YELLOW (A) 08/14/2020 0908   APPEARANCEUR CLEAR (A) 08/14/2020 0908   LABSPEC 1.015 08/14/2020 0908   PHURINE 5.0 08/14/2020 0908   GLUCOSEU NEGATIVE 08/14/2020 0908   HGBUR NEGATIVE 08/14/2020 0908   BILIRUBINUR NEGATIVE 08/14/2020 0908   KETONESUR NEGATIVE 08/14/2020 0908   PROTEINUR NEGATIVE 08/14/2020 0908   NITRITE NEGATIVE 08/14/2020 0908   LEUKOCYTESUR NEGATIVE 08/14/2020 0908    Radiological Exams on Admission: No results found.   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 Patient presents evaluation of constant right lower quadrant pain minimal relief with oxycodone Patient was prescribed fentanyl patch during his last hospitalization but is yet to pick up prescription We will place patient on fentanyl patch 12 mcg every 72 hours We will place patient on morphine 2 mg IV every 3    Pancreatic cancer Recently diagnosed Request oncology consult for further recommendation and treatment plan     Diabetes mellitus with complications of stage III chronic kidney disease Patient's oral intake is poor We will place patient on nutritional supplements Blood sugar checks with meals    Constipation due to opioid therapy Continue stool softeners which include MiraLAX, senna and lactulose    GERD Continue Protonix   Hypertension Hold antihypertensive medications for now since patient is normotensive   DVT prophylaxis: Lovenox Code Status: DO NOT RESUSCITATE Family Communication: Greater than 50% of time was spent discussing patient's condition and plan of care with him, wife and daughter at the bedside.  All questions and concerns have been addressed.  CODE STATUS was discussed and he is a DO NOT RESUSCITATE. Disposition Plan: Back to previous home environment Consults called: Oncology Status: Observation    Tayron Hunnell MD [facility]     08/24/2020, 4:20 PM

## 2020-08-24 NOTE — Progress Notes (Addendum)
Wife reports that patient has been confused and often "talking out of his head." patient drowsy today but easily reoriented/awakened.  Patient c/o dizziness.

## 2020-08-24 NOTE — ED Notes (Signed)
Fentanyl given, pt started having shallow RR and decrease in O2 sat, pt placed on 3L/min and educated to keep taking deep breaths, MD aware.

## 2020-08-24 NOTE — Progress Notes (Addendum)
Patient called EMS today due to abdominal pain.  Patient reports no bowel movement since Saturday. He can not recall when he last voided. Patient did not try the miralax/senna as directed; however took one dose of lactulose this morning.  EMS gave him the lactulose per Wife.   Patient is not eating. Only has been drinking enough water to get his tablets down per (wife). He has not used any of the ensure/boost that was given to him on Monday. He can not find the 'bag' of ensures. He reports mouth sores/blisters. He has only used the nystatin mouthwash twice since getting the script on Monday.  Patient does not recall when he last voided. Pt took his oxycodone 5 mg (2 tablets) at 630 am.  Patient reports abdominal pain 8/10 - right lower quadrant. Patient reports lower extremity weakness; unable to stand today for his weight. Weighed on w/c scale.

## 2020-08-24 NOTE — ED Provider Notes (Signed)
[facility] Emergency Department Provider Note ____________________________________________   Event Date/Time   First MD Initiated Contact with Patient 08/24/20 [phone]     (approximate)  I have reviewed the triage vital signs and the nursing notes.  HISTORY  Chief Complaint Abdominal Pain   HPI Ricky Riley is a 80 y.o. malewho presents to the ED for evaluation of acute on chronic pain.  Chart review indicates patient saw oncology as an outpatient this morning, Dr. Rogue Bussing.  Recently discovered pancreatic and hepatic lesions on 3/3 ED CT abdomen/pelvis.  Concerning for pancreatic mass with mets, retroperitoneal lymphadenopathy.  Admitted 3/9-3/12 for poorly controlled cancer-related pain.  3/8 IR liver biopsy reviewed with pancreaticobiliary cell types.  Patient presents to the ED from oncology clinic due to 10 out of 10, acute on chronic, abdominal pain.  Patient reports severe 10/10 intensity diffuse abdominal pain.  Due to this, he was sent to the ED for evaluation from the oncology clinic.  Patient's wife and daughter later present to the ED and provide regional history.  They report that patient was unable to start outpatient chemotherapy, which was originally planned to start today, due to his severe pain.  Review of the chart indicates that Dr. Rogue Bussing had some concerns about patient's pain, clinical course, and to consider hospice evaluation.  I discussed palliative care, palliative chemotherapy and hospice care with the patient and his family.  Patient reports wanting to have improved pain as his only desire.  Wife and daughter are more eager for chemotherapy.  Past Medical History:  Diagnosis Date  . Arthritis   . Cancer associated pain   . Diabetes mellitus without complication ([place])   . Dysrhythmia   . GERD (gastroesophageal reflux disease)   . Headache   . Hypertension   . Sleep apnea    CPAP     Patient Active Problem List    Diagnosis Date Noted  . Cancer related pain 08/24/2020  . 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 Goals of care, counseling/discussion 08/19/2020  . Constipation due to opioid therapy   . Palliative care encounter   . Abdominal pain 08/17/2020  . Pancreatic mass 08/17/2020  . Lesion of liver 08/12/2020  . Pain due to onychomycosis of toenails of both feet 03/31/2020  . Diabetes mellitus without complication ([place]) 82/99/3716  . Lobar pneumonia ([place]) 12/13/2019  . CAP (community acquired pneumonia) 12/12/2019  . Transaminitis 12/12/2019  . GERD (gastroesophageal reflux disease) 12/12/2019  . Acute renal failure superimposed on stage 3a chronic kidney disease ([place]) 12/12/2019  . Type II diabetes mellitus with renal manifestations ([place]) 12/12/2019  . Left inguinal hernia 04/22/2019  . Closed fracture of body of sternum with delayed healing 04/30/2017  . SOB (shortness of breath) 03/22/2017  . Chest pain, musculoskeletal 02/05/2017  . Acute pain of right shoulder 01/11/2017  . Calf cramp 12/31/2016  . Right thyroid nodule 04/20/2016  . Primary osteoarthritis of right knee 03/30/2016  . Abnormal CT scan, neck 03/21/2016  . Hypercalcemia 03/12/2016  . Chronic cough 01/23/2016  . Current chronic use of systemic steroids 01/23/2016  . Headache 01/26/2015  . Heart palpitations 03/17/2014  . Hypertension 03/17/2014  . Sleep apnea 03/17/2014  . Male erectile dysfunction 01/15/2013  . 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 ARTERY BIOPSY Left 03/02/2015   Procedure: BIOPSY TEMPORAL ARTERY;  Surgeon: Clyde Canterbury,  MD;  Location: [facility] ORS;  Service: ENT;  Laterality: Left;  . HERNIA REPAIR    . LEFT HEART CATH AND CORONARY ANGIOGRAPHY Left 01/27/2020   Procedure: LEFT HEART CATH AND CORONARY ANGIOGRAPHY;  Surgeon: Teodoro Spray, MD;  Location:  [facility];  Service: Cardiovascular;  Laterality: Left;  . PENILE PROSTHESIS IMPLANT    . TRANSURETHRAL RESECTION OF PROSTATE    . XI ROBOTIC ASSISTED INGUINAL HERNIA REPAIR WITH MESH Left 04/22/2019   Procedure: XI ROBOTIC ASSISTED INGUINAL HERNIA REPAIR WITH MESH;  Surgeon: Herbert Pun, MD;  Location: [facility] ORS;  Service: General;  Laterality: Left;    Prior to Admission medications   Medication Sig Start Date End Date Taking? Authorizing Provider  acetaminophen (TYLENOL) 500 MG tablet Take 500 mg by mouth every 6 (six) hours as needed for moderate pain or headache.   Yes [provider]  cetirizine (ZYRTEC) 10 MG tablet Take 10 mg by mouth daily as needed for allergies.   Yes [provider]  fentaNYL (DURAGESIC) 12 MCG/HR Place 1 patch onto the skin every 3 (three) days. 08/22/20  Yes Borders, Kirt Boys, NP  finasteride (PROSCAR) 5 MG tablet Take 5 mg by mouth daily.   Yes [provider]  fluticasone (FLONASE) 50 MCG/ACT nasal spray Place 2 sprays into both nostrils daily as needed for rhinitis. 02/08/20 02/07/21 Yes [provider]  hydrochlorothiazide (HYDRODIURIL) 25 MG tablet TAKE ONE-HALF TABLET BY MOUTH ONCE EVERY DAY FOR BLOOD PRESSURE 07/01/20  Yes [provider]  Lactulose 20 GM/30ML SOLN Take 15 mLs (10 g total) by mouth 2 (two) times daily as needed (constipation). 08/22/20  Yes Borders, Kirt Boys, NP  losartan (COZAAR) 25 MG tablet TAKE ONE TABLET BY MOUTH EVERY MORNING FOR BLOOD PRESSURE 08/17/19  Yes [provider]  Menthol-Methyl Salicylate (THERA-GESIC) 0.5-15 % CREA APPLY MODERATE AMOUNT TOPICALLY 3 TIMES A DAY AS NEEDED FOR PAIN ( AVOID FACE and EYES, [place] HANDS AFTER USING). 08/17/19  Yes [provider]  metFORMIN (GLUCOPHAGE) 500 MG tablet Take 500 mg by mouth daily with breakfast.   Yes [provider]  mirtazapine (REMERON) 15 MG tablet Take 15 mg by mouth at bedtime as needed (sleep).   08/21/10  Yes [provider]  nystatin (MYCOSTATIN) 100000 UNIT/ML suspension Take 5 mLs (500,000 Units total) by mouth 4 (four) times daily. 08/22/20  Yes Borders, Kirt Boys, NP  omeprazole (PRILOSEC) 20 MG capsule Take 20 mg by mouth daily before breakfast.    Yes [provider]  ondansetron (ZOFRAN ODT) 4 MG disintegrating tablet Take 1 tablet (4 mg total) by mouth every 8 (eight) hours as needed for nausea or vomiting. 08/14/20  Yes Naaman Plummer, MD  oxyCODONE (OXY IR/ROXICODONE) 5 MG immediate release tablet Take 1-2 tablets (5-10 mg total) by mouth every 4 (four) hours as needed for moderate pain or severe pain. 08/22/20  Yes Borders, Kirt Boys, NP  senna-docusate (SENOKOT-S) 8.6-50 MG tablet Take 1 tablet by mouth 2 (two) times daily. 08/15/20  Yes Cammie Sickle, MD  sodium chloride (OCEAN) 0.65 % nasal spray SPRAY 2 SPRAYS INTO EACH NOSTRIL EVERY MORNING 08/17/19  Yes [provider]  terazosin (HYTRIN) 10 MG capsule Take 10 mg by mouth at bedtime.   Yes [provider]  traZODone (DESYREL) 50 MG tablet Take 50 mg by mouth daily as needed for sleep.  01/17/20  Yes [provider]  aspirin 81 MG EC tablet Take 81 mg  by mouth daily. Patient not taking: Reported on 08/24/2020 02/03/19   [provider]    Allergies Atorvastatin, Flunisolide, Lisinopril, Rosuvastatin, Ciprofloxacin, Penicillins, Sulfa antibiotics, and Sulfasalazine  Family History  Problem Relation Age of Onset  . Diabetes Mother   . Heart disease Mother   . Diabetes Father   . Heart disease Father   . Cancer Father 37  . Diabetes Sister   . Diabetes Brother   . Kidney failure Brother     Social History Social History   Tobacco Use  . Smoking status: Former Smoker    Packs/day: 1.00    Types: Cigarettes    Quit date: 06/05/2009    Years since quitting: 11.2  . Smokeless tobacco: Never Used  Vaping Use  . Vaping Use: Never used  Substance Use Topics  .  Alcohol use: No  . Drug use: No    Review of Systems  Constitutional: No fever/chills Eyes: No visual changes. ENT: No sore throat. Cardiovascular: Denies chest pain. Respiratory: Denies shortness of breath. Gastrointestinal: Positive for abdominal pain and nausea. no vomiting.  No diarrhea.  No constipation. Genitourinary: Negative for dysuria. Musculoskeletal: Negative for back pain. Skin: Negative for rash. Neurological: Negative for headaches, focal weakness or numbness. ____________________________________________   PHYSICAL EXAM:  VITAL SIGNS: Vitals:   08/24/20 1200 08/24/20 1330  BP: 106/68 134/76  Pulse: 85 81  Resp: 19 15  Temp:    SpO2: 94% (!) 87%    Constitutional: Alert and oriented.  Appears uncomfortable, but no acute distress.  Speaking in full sentences. Eyes: Conjunctivae are normal. PERRL. EOMI. Head: Atraumatic. Nose: No congestion/rhinnorhea. Mouth/Throat: Mucous membranes are dry.  Oropharynx non-erythematous. Neck: No stridor. No cervical spine tenderness to palpation. Cardiovascular: Normal rate, regular rhythm. Grossly normal heart sounds.  Good peripheral circulation. Respiratory: Normal respiratory effort.  No retractions. Lungs CTAB. Gastrointestinal: Soft , nondistended.  Diffuse tenderness without peritoneal features.  Musculoskeletal: No lower extremity tenderness nor edema.  No joint effusions. No signs of acute trauma. Neurologic:  Normal speech and language. No gross focal neurologic deficits are appreciated. No gait instability noted. Skin:  Skin is warm, dry and intact. No rash noted. Psychiatric: Mood and affect are normal. Speech and behavior are normal. ____________________________________________   LABS (all labs ordered are listed, but only abnormal results are displayed)  Labs Reviewed  COMPREHENSIVE METABOLIC PANEL - Abnormal; Notable for the following components:      Result Value   Sodium 131 (*)    CO2 21 (*)     Glucose, Bld 144 (*)    BUN 38 (*)    Creatinine, Ser 1.67 (*)    Calcium 10.4 (*)    Albumin 3.2 (*)    AST 52 (*)    Alkaline Phosphatase 204 (*)    Total Bilirubin 1.5 (*)    GFR, Estimated 42 (*)    All other components within normal limits  CBC - Abnormal; Notable for the following components:   WBC 15.7 (*)    Hemoglobin 12.3 (*)    HCT 37.8 (*)    MCV 79.7 (*)    MCH 25.9 (*)    All other components within normal limits  SARS CORONAVIRUS 2 (TAT 6-24 HRS)  LIPASE, BLOOD  URINALYSIS, COMPLETE (UACMP) WITH MICROSCOPIC  URINALYSIS, ROUTINE W REFLEX MICROSCOPIC   ____________________________________________  12 Lead EKG Sinus rhythm, rate of 87 bpm.  Normal axis.  Normal intervals.  Stigmata of LVH.  Biphasic T waves to 1 and  aVL.  Submillimeter elevations to V1 and V2.  No STEMI criteria.  Similar to EKG from 01/2020.  ____________________________________________  RADIOLOGY  ED MD interpretation: CT abdomen/pelvis from 3/3 reviewed ____________________________________________   PROCEDURES and INTERVENTIONS  Procedure(s) performed (including Critical Care):  .1-3 Lead EKG Interpretation Performed by: Vladimir Crofts, MD Authorized by: Vladimir Crofts, MD     Interpretation: normal     ECG rate:  84   ECG rate assessment: normal     Rhythm: sinus rhythm     Ectopy: none     Conduction: normal      Medications  acetaminophen (TYLENOL) tablet 500 mg (has no administration in time range)  aspirin EC tablet 81 mg (has no administration in time range)  fentaNYL (DURAGESIC) 12 MCG/HR 1 patch (has no administration in time range)  terazosin (HYTRIN) capsule 10 mg (has no administration in time range)  mirtazapine (REMERON) tablet 15 mg (has no administration in time range)  traZODone (DESYREL) tablet 50 mg (has no administration in time range)  lactulose (CHRONULAC) 10 GM/15ML solution 10 g (has no administration in time range)  pantoprazole (PROTONIX) EC tablet 40 mg  (has no administration in time range)  finasteride (PROSCAR) tablet 5 mg (has no administration in time range)  loratadine (CLARITIN) tablet 10 mg (has no administration in time range)  fluticasone (FLONASE) 50 MCG/ACT nasal spray 2 spray (has no administration in time range)  sodium chloride (OCEAN) 0.65 % nasal spray 2 spray (has no administration in time range)  nystatin (MYCOSTATIN) 100000 UNIT/ML suspension 500,000 Units (has no administration in time range)  enoxaparin (LOVENOX) injection 40 mg (has no administration in time range)  0.9 %  sodium chloride infusion (has no administration in time range)  morphine 2 MG/ML injection 2 mg (has no administration in time range)  ondansetron (ZOFRAN) tablet 4 mg (has no administration in time range)    Or  ondansetron (ZOFRAN) injection 4 mg (has no administration in time range)  bisacodyl (DULCOLAX) suppository 10 mg (has no administration in time range)  lactated ringers bolus 1,000 mL (0 mLs Intravenous Stopped 08/24/20 1335)  morphine 4 MG/ML injection 6 mg (6 mg Intravenous Given 08/24/20 1119)  ondansetron (ZOFRAN) injection 4 mg (4 mg Intravenous Given 08/24/20 1119)  fentaNYL (SUBLIMAZE) injection 75 mcg (75 mcg Intravenous Given 08/24/20 1324)    ____________________________________________   MDM / ED COURSE   80 year old male recently diagnosed with metastatic pancreatic cancer presents to the ED with uncontrolled pain necessitating medical admission for pain control and to discuss hospice care.  Normal vitals on room air.  Exam with diffuse abdominal tenderness without peritoneal features in an uncomfortable-appearing patient.  He is in no distress and has no neurovascular deficits.  No signs of trauma.  His blood work demonstrates CKD, chronic leukocytosis, mild chronic hyponatremia, and a slight decrement to his bicarbonate suggestive of dehydration.  He received IV fluids and multiple rounds of IV analgesia, but despite this his pain  only decreased down to a 7 or 8 / 10.  We will discussed the case with hospitalist medicine for observation admission for pain control and to discuss goals of care.   Clinical Course as of 08/24/20 1519  Wed Aug 24, 2020  1238 Reassessed.  Wife and daughter now at the bedside. Patient reports his pain improved from a 9 to an 8 after the morphine.  He is requesting additional analgesia.  I ordered fentanyl. [DS]    Clinical Course User Index [DS] Tamala Julian,  Camillia Herter, MD    ____________________________________________   FINAL CLINICAL IMPRESSION(S) / ED DIAGNOSES  Final diagnoses:  Cancer related pain     ED Discharge Orders    None       Christ Fullenwider Tamala Julian   Note:  This document was prepared using Dragon voice recognition software and may include unintentional dictation errors.   Vladimir Crofts, MD 08/24/20 [phone]

## 2020-08-24 NOTE — Assessment & Plan Note (Addendum)
#  Pancreatic adenocarcinoma-stage IV liver biopsy; CA 19-9- >20,000. CT scan August 11, 2020 [ER]-multiple hypodense hepatic lesions; also 5.8 cm tail of pancreas mass; positive for gastrohepatic periportal retroperitoneal adenopathy; multiple peritoneal deposits.  Left posterior iliac sclerotic lesion.  #Hold chemotherapy today-given patient's poor performance status [urinary retention; constipation; altered mental status-see below].  Long discussion with patient regarding need to treat with chemotherapy however given patient's acute issues will need to hold chemotherapy at this point exacerbate patient's symptoms/and potential for significant worsening of side effects.  #Recommend ER evaluation - altered mental status-likely secondary to narcotics/constipation/narcotic induced-urinary retention-need Foley catheter.   #CODE STATUS-given the significant burden of disease/borderline performance status-discussed the overall futility of aggressive measures like full code.  Patient high risk of decompensation during hospitalization.  Recommend follow-up with palliative care.  #Discussed with patient/patient's wife at length.  # DISPOSITION: ER evaluation.

## 2020-08-24 NOTE — Progress Notes (Signed)
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 [facility] CAD [Dr.Fath; [facility]]  HISTORY OF PRESENTING ILLNESS:  Ricky Riley 80 y.o.  male newly diagnosed pancreatic adenocarcinoma is here for follow-up.  Patient was recently admitted to hospital for worsening constipation; discharged home after patient had a bowel movement after aggressive laxatives.  Patient is here to proceed with chemotherapy.  However patient has not had any bowel movement for the last 4 days.  As per family patient has not urinated in the last 12 hours.  As per the wife patient has been confused.  Unstable gait.  Complains of abdominal pain.  Positive for nausea.  Review of Systems  Constitutional: Positive for malaise/fatigue and weight loss. Negative for chills, diaphoresis and fever.  HENT: Negative for nosebleeds and sore throat.   Eyes: Negative for double vision.  Respiratory: Negative for cough, hemoptysis, sputum production, shortness of breath and wheezing.   Cardiovascular: Negative for chest pain, palpitations, orthopnea  and leg swelling.  Gastrointestinal: Positive for abdominal pain and nausea. Negative for blood in stool, constipation, diarrhea, heartburn and melena.  Genitourinary: Negative for dysuria, frequency and urgency.  Musculoskeletal: Positive for back pain. Negative for joint pain.  Skin: Negative.  Negative for itching and rash.  Neurological: Positive for dizziness and weakness. Negative for tingling, focal weakness and headaches.  Endo/Heme/Allergies: Does not bruise/bleed easily.  Psychiatric/Behavioral: Negative for depression. The patient is not nervous/anxious and does not have insomnia.      MEDICAL HISTORY:  Past Medical History:  Diagnosis Date  . Arthritis   . Cancer associated pain   . Diabetes mellitus without complication ([place])   . Dysrhythmia   . GERD (gastroesophageal reflux disease)   . Headache   . Hypertension   . Sleep apnea    CPAP     SURGICAL HISTORY: Past Surgical History:  Procedure Laterality Date  . ARTERY BIOPSY Left 03/02/2015   Procedure: BIOPSY TEMPORAL ARTERY;  Surgeon: Clyde Canterbury, MD;  Location: [facility];  Service: ENT;  Laterality: Left;  . HERNIA REPAIR    . LEFT HEART CATH AND CORONARY ANGIOGRAPHY Left 01/27/2020   Procedure: LEFT HEART CATH AND CORONARY ANGIOGRAPHY;  Surgeon: Teodoro Spray, MD;  Location: [facility];  Service: Cardiovascular;  Laterality: Left;  . PENILE PROSTHESIS IMPLANT    . TRANSURETHRAL RESECTION OF PROSTATE    . XI ROBOTIC ASSISTED INGUINAL HERNIA REPAIR WITH MESH Left 04/22/2019   Procedure: XI ROBOTIC ASSISTED INGUINAL HERNIA REPAIR WITH MESH;  Surgeon: Herbert Pun, MD;  Location: [facility];  Service: General;  Laterality: Left;    SOCIAL HISTORY: Social History  Socioeconomic History  . Marital status: Married    Spouse name: Not on file  . Number of children: Not on file  . Years of education: Not on file  . Highest education level: Not on file  Occupational History  . Not on file   Tobacco Use  . Smoking status: Former Smoker    Packs/day: 1.00    Types: Cigarettes    Quit date: 06/05/2009    Years since quitting: 11.2  . Smokeless tobacco: Never Used  Vaping Use  . Vaping Use: Never used  Substance and Sexual Activity  . Alcohol use: No  . Drug use: No  . Sexual activity: Not on file  Other Topics Concern  . Not on file  Social History Narrative   Lives in [place] with wife.  He is his wife's caregiver. Daughter-[place]; son- [place]. Used to work in Social worker jobs. Quit smoking in 2012. No alcohol.     Social Determinants of Health   Financial Resource Strain: Not on file  Food Insecurity: Not on file  Transportation Needs: Not on file  Physical Activity: Not on file  Stress: Not on file  Social Connections: Not on file  Intimate Partner Violence: Not on file    FAMILY HISTORY: Family History  Problem Relation Age of Onset  . Diabetes Mother   . Heart disease Mother   . Diabetes Father   . Heart disease Father   . Cancer Father 58  . Diabetes Sister   . Diabetes Brother   . Kidney failure Brother     ALLERGIES:  is allergic to atorvastatin, flunisolide, lisinopril, rosuvastatin, ciprofloxacin, penicillins, sulfa antibiotics, and sulfasalazine.  MEDICATIONS:  No current facility-administered medications for this visit.   No current outpatient medications on file.   Facility-Administered Medications Ordered in Other Visits  Medication Dose Route Frequency Provider Last Rate Last Admin  . 0.9 %  sodium chloride infusion  250 mL Intravenous PRN Teodoro Spray, MD      . 0.9 %  sodium chloride infusion   Intravenous Continuous Agbata, Tochukwu, MD      . 0.9 %  sodium chloride infusion   Intravenous Continuous Agbata, Tochukwu, MD 75 mL/hr at 08/24/20 1724 New Bag at 08/24/20 1724  . 0.9% sodium chloride infusion  1 mL/kg/hr Intravenous Continuous Teodoro Spray, MD      . acetaminophen (TYLENOL) tablet 500 mg  500 mg Oral Q6H PRN  Agbata, Tochukwu, MD      . aspirin EC tablet 81 mg  81 mg Oral Daily Agbata, Tochukwu, MD      . enoxaparin (LOVENOX) injection 40 mg  40 mg Subcutaneous Q24H Agbata, Tochukwu, MD      . feeding supplement (GLUCERNA SHAKE) (GLUCERNA SHAKE) liquid 237 mL  237 mL Oral TID BM Agbata, Tochukwu, MD      . fentaNYL (DURAGESIC) 12 MCG/HR 1 patch  1 patch Transdermal Q72H Agbata, Tochukwu, MD   1 patch at 08/24/20 1827  . [START ON 08/25/2020] finasteride (PROSCAR) tablet 5 mg  5 mg Oral Daily Agbata, Tochukwu, MD      . fluticasone (FLONASE) 50 MCG/ACT nasal spray 2 spray  2 spray Each Nare Daily PRN Agbata, Tochukwu, MD      . lactulose (CHRONULAC) 10 GM/15ML solution 10 g  10 g Oral BID PRN Agbata, Tochukwu, MD      . Derrill Memo ON 08/25/2020] loratadine (CLARITIN) tablet 10 mg  10 mg Oral Daily Agbata, Tochukwu, MD      .  mirtazapine (REMERON) tablet 15 mg  15 mg Oral QHS PRN Agbata, Tochukwu, MD      . morphine 2 MG/ML injection 2 mg  2 mg Intravenous Q3H PRN Agbata, Tochukwu, MD   2 mg at 08/24/20 1832  . nystatin (MYCOSTATIN) 100000 UNIT/ML suspension 500,000 Units  5 mL Oral QID Agbata, Tochukwu, MD   500,000 Units at 08/24/20 1827  . ondansetron (ZOFRAN) tablet 4 mg  4 mg Oral Q6H PRN Agbata, Tochukwu, MD       Or  . ondansetron (ZOFRAN) injection 4 mg  4 mg Intravenous Q6H PRN Agbata, Tochukwu, MD      . pantoprazole (PROTONIX) EC tablet 40 mg  40 mg Oral Daily Agbata, Tochukwu, MD      . senna-docusate (Senokot-S) tablet 2 tablet  2 tablet Oral QHS Agbata, Tochukwu, MD      . Derrill Memo ON 08/25/2020] sodium chloride (OCEAN) 0.65 % nasal spray 2 spray  2 spray Each Nare Daily Agbata, Tochukwu, MD      . sodium chloride flush (NS) 0.9 % injection 3 mL  3 mL Intravenous Q12H Bartholome Bill A, MD      . sodium chloride flush (NS) 0.9 % injection 3 mL  3 mL Intravenous PRN Teodoro Spray, MD      . terazosin (HYTRIN) capsule 10 mg  10 mg Oral QHS Agbata, Tochukwu, MD      . traZODone (DESYREL) tablet 50 mg   50 mg Oral QHS PRN Agbata, Tochukwu, MD          .  PHYSICAL EXAMINATION: ECOG PERFORMANCE STATUS: 1 - Symptomatic but completely ambulatory  Vitals:   08/24/20 0915  BP: 112/90  Pulse: (!) 56  Resp: 16  Temp: 97.9 F (36.6 C)  SpO2: 100%   Filed Weights   08/24/20 0913  Weight: 183 lb (83 kg)    Physical Exam Constitutional:      Comments: Ambulating with wife's walker. Alone.   HENT:     Head: Normocephalic and atraumatic.     Mouth/Throat:     Pharynx: No oropharyngeal exudate.  Eyes:     Pupils: Pupils are equal, round, and reactive to light.  Cardiovascular:     Rate and Rhythm: Normal rate and regular rhythm.  Pulmonary:     Effort: No respiratory distress.     Breath sounds: No wheezing.  Abdominal:     General: Bowel sounds are normal. There is no distension.     Palpations: Abdomen is soft. There is no mass.     Tenderness: There is no abdominal tenderness. There is no guarding or rebound.  Musculoskeletal:        General: No tenderness. Normal range of motion.     Cervical back: Normal range of motion and neck supple.  Skin:    General: Skin is warm.  Neurological:     Mental Status: He is alert and oriented to person, place, and time.  Psychiatric:        Mood and Affect: Affect normal.      LABORATORY DATA:  I have reviewed the data as listed Lab Results  Component Value Date   WBC 15.7 (H) 08/24/2020   HGB 12.3 (L) 08/24/2020   HCT 37.8 (L) 08/24/2020   MCV 79.7 (L) 08/24/2020   PLT 270 08/24/2020   Recent Labs    12/12/19 0836 12/13/19 0416 12/14/19 0641 08/11/20 1913 08/22/20 1322 08/24/20 0859 08/24/20 1051  NA 131* 135 134*   < >  131* 134* 131*  K 3.6 4.1 4.2   < > 4.7 4.5 4.7  CL 99 105 105   < > 98 103 101  CO2 22 23 23    < > 23 20* 21*  GLUCOSE 143* 109* 104*   < > 152* 148* 144*  BUN 24* 21 20   < > 34* 37* 38*  CREATININE 1.59* 1.37* 1.22   < > 1.72* 1.55* 1.67*  CALCIUM 9.8 9.2 9.5   < > 10.3 10.4* 10.4*   GFRNONAA 41* 49* 57*   < > 40* 46* 42*  GFRAA 48* 57* >60  --   --   --   --   PROT 7.6  --   --    < > 7.2 6.9 6.8  ALBUMIN 3.6  --   --    < > 3.3* 3.3* 3.2*  AST 17  --   --    < > 50* 53* 52*  ALT 11  --   --    < > 36 33 34  ALKPHOS 51  --   --    < > 217* 204* 204*  BILITOT 0.7  --   --    < > 1.6* 1.6* 1.5*   < > = values in this interval not displayed.    RADIOGRAPHIC STUDIES: I have personally reviewed the radiological images as listed and agreed with the findings in the report. CT ABDOMEN PELVIS W CONTRAST  Result Date: 08/11/2020 CLINICAL DATA:  Bilateral flank pain EXAM: CT ABDOMEN AND PELVIS WITH CONTRAST TECHNIQUE: Multidetector CT imaging of the abdomen and pelvis was performed using the standard protocol following bolus administration of intravenous contrast. CONTRAST:  177mL OMNIPAQUE IOHEXOL 300 MG/ML  SOLN COMPARISON:  CT 12/12/2019 FINDINGS: Lower chest: Lung bases demonstrate no acute consolidation or effusion. Probable scarring at the right middle lobe and right base. Borderline cardiomegaly. No significant pericardial effusion. Hepatobiliary: Interim development of numerous hypodense hepatic masses concerning for metastatic disease. No calcified gallstone. No biliary dilatation. The gallbladder appears slightly thick walled. Pancreas: No inflammatory changes. Mild pancreatic ductal dilatation. Irregular hypodense mass at the tail of the pancreas measuring 5.8 by 3.1 by 3.4 cm. Spleen: Normal in size without focal abnormality. Adrenals/Urinary Tract: Adrenal glands are within normal limits. Kidneys show bilateral cysts. No hydronephrosis. The urinary bladder is unremarkable. Stomach/Bowel: The stomach is nonenlarged. No dilated small bowel. No acute bowel wall thickening. Negative appendix Vascular/Lymphatic: Mild aortic atherosclerosis. No aneurysmal dilatation. Poorly defined soft tissue density at the gastrohepatic ligament measuring approximately 3 x 1.5 cm concerning for  metastatic disease. Periportal lymph nodes measuring up to 18 cm. 13 mm left retroperitoneal lymph node at the level of the SMA. Reproductive: Prostate unremarkable. Penile prosthesis with left pelvic deflated reservoir. Other: No free air. Small amount of abdominopelvic ascites. Small mesenteric nodules, for example 3 mm nodule anterior to the distal descending colon, series 2, image number 53. Multiple small soft tissue nodules in the right colic gutter. Multiple soft tissue nodules anterior to the left hepatic lobe measuring up to 17 mm, series 7, image number 17. Musculoskeletal: New lucent lesion with peripheral sclerosis in the posterior left ilium, series 2, image number 72 indeterminate for metastatic disease. IMPRESSION: 1. Interim development of numerous hypodense hepatic masses concerning for metastatic disease. 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 2. Gastrohepatic, periportal and retroperitoneal adenopathy also concerning for metastatic disease. Small amount  of abdominopelvic ascites with multiple intraperitoneal nodules, concerning for peritoneal metastatic disease. 3. New lucent lesion with peripheral sclerosis in the posterior left ilium, indeterminate for metastatic disease. Aortic Atherosclerosis (ICD10-I70.0). Electronically Signed   By: Donavan Foil M.D.   On: 08/11/2020 21:50   US BIOPSY (LIVER)  Result Date: 08/16/2020 INDICATION: Pancreatic mass and multiple liver lesions EXAM: Ultrasound-guided liver lesion biopsy MEDICATIONS: None. ANESTHESIA/SEDATION: Moderate (conscious) sedation was employed during this procedure. A total of Versed 1 mg and Fentanyl 50 mcg was administered intravenously. Moderate Sedation Time: The 15 minutes. The patient's level of consciousness and vital signs were monitored continuously by radiology nursing throughout the procedure under my direct supervision. COMPLICATIONS: None  immediate. PROCEDURE: Informed written consent was obtained from the patient after a thorough discussion of the procedural risks, benefits and alternatives. All questions were addressed. Maximal Sterile Barrier Technique was utilized including caps, mask, sterile gowns, sterile gloves, sterile drape, hand hygiene and skin antiseptic. A timeout was performed prior to the initiation of the procedure. Patient position left lateral decubitus on the ultrasound table. Right upper quadrant skin prepped and draped in usual sterile fashion. Following local lidocaine administration, 17 gauge introducer needle was advanced into 1 of the right liver lesions, and four 18 gauge cores were obtained utilizing continuous ultrasound guidance. Gelfoam slurry was administered through the introducer needle at the biopsy site. Samples were sent to pathology in formalin. Needle removed and hemostasis achieved with 5 minutes of manual compression. Post procedure ultrasound images showed no evidence of significant hemorrhage. IMPRESSION: Ultrasound-guided biopsy of right liver mass. Electronically Signed   By: Miachel Roux M.D.   On: 08/16/2020 10:34   DG Abd 2 Views  Result Date: 08/18/2020 CLINICAL DATA:  Constipation due to opiate therapy. EXAM: ABDOMEN - 2 VIEW COMPARISON:  CT 08/11/2020 FINDINGS: No free intra-abdominal air moderate stool in the ascending, transverse, proximal descending colon. Small volume of stool in the distal descending and sigmoid colon. Enteric contrast from prior abdominal CT is present in the left colon. No small bowel dilatation or evidence of obstruction. 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 Linear right basilar atelectasis. IMPRESSION: Moderate stool in the colon. No evidence of bowel obstruction. Previous enteric contrast from prior abdominal CT is present in the left colon. Electronically Signed   By: Keith Rake M.D.   On: 08/18/2020 20:04    ASSESSMENT & PLAN:   Cancer of pancreas, tail  ([place]) #Pancreatic adenocarcinoma-stage IV liver biopsy; CA 19-9- >20,000. CT scan August 11, 2020 [ER]-multiple hypodense hepatic lesions; also 5.8 cm tail of pancreas mass; positive for gastrohepatic periportal retroperitoneal adenopathy; multiple peritoneal deposits.  Left posterior iliac sclerotic lesion.  #Hold chemotherapy today-given patient's poor performance status [urinary retention; constipation; altered mental status-see below].  Long discussion with patient regarding need to treat with chemotherapy however given patient's acute issues will need to hold chemotherapy at this point exacerbate patient's symptoms/and potential for significant worsening of side effects.  #Recommend ER evaluation - altered mental status-likely secondary to narcotics/constipation/narcotic induced-urinary retention-need Foley catheter.   #CODE STATUS-given the significant burden of disease/borderline performance status-discussed the overall futility of aggressive measures like full code.  Patient high risk of decompensation during hospitalization.  Recommend follow-up with palliative care.  #Discussed with patient/patient's wife at length.  # DISPOSITION: ER evaluation.   All questions were answered. The patient knows to call the clinic with any problems, questions or concerns.    Cammie Sickle, MD 08/24/2020 7:20 PM

## 2020-08-24 NOTE — ED Triage Notes (Signed)
Pt arrived to ed from cancer center for abd pain. Hx of pancreatic cancer. Pt states 10/10 generalized abd pain. Denies N/V. nad noted at this time. Reporting nurse from cancer center states pt to be evaluated for possible hospice care. Contact Dr. Rogue Bussing with questions/concerns at [phone].

## 2020-08-24 NOTE — ED Notes (Signed)
Pt presents to ED with c/o of RLQ pain that states started "recently". Pt states he was sent from cancer center for ABD work up. Pt has a HX of pancreatic cancer. Pt states it does "hurt" when he urinates. Pt denies any SOB or chest pain. Pt is A&Ox4. NAD noted at this time. Triage RN states pt is possibly also being evulated for pallatiative/hopsice care as well.

## 2020-08-25 ENCOUNTER — Encounter: Payer: Self-pay | Admitting: Internal Medicine

## 2020-08-25 DIAGNOSIS — K5900 Constipation, unspecified: Secondary | ICD-10-CM | POA: Diagnosis not present

## 2020-08-25 DIAGNOSIS — T402X5A Adverse effect of other opioids, initial encounter: Secondary | ICD-10-CM | POA: Diagnosis present

## 2020-08-25 DIAGNOSIS — K5903 Drug induced constipation: Secondary | ICD-10-CM | POA: Diagnosis present

## 2020-08-25 DIAGNOSIS — Z882 Allergy status to sulfonamides status: Secondary | ICD-10-CM | POA: Diagnosis not present

## 2020-08-25 DIAGNOSIS — G893 Neoplasm related pain (acute) (chronic): Principal | ICD-10-CM

## 2020-08-25 DIAGNOSIS — Z888 Allergy status to other drugs, medicaments and biological substances status: Secondary | ICD-10-CM | POA: Diagnosis not present

## 2020-08-25 DIAGNOSIS — R338 Other retention of urine: Secondary | ICD-10-CM | POA: Diagnosis present

## 2020-08-25 DIAGNOSIS — Z66 Do not resuscitate: Secondary | ICD-10-CM | POA: Diagnosis present

## 2020-08-25 DIAGNOSIS — C252 Malignant neoplasm of tail of pancreas: Secondary | ICD-10-CM

## 2020-08-25 DIAGNOSIS — E1122 Type 2 diabetes mellitus with diabetic chronic kidney disease: Secondary | ICD-10-CM | POA: Diagnosis present

## 2020-08-25 DIAGNOSIS — G4730 Sleep apnea, unspecified: Secondary | ICD-10-CM | POA: Diagnosis present

## 2020-08-25 DIAGNOSIS — X58XXXA Exposure to other specified factors, initial encounter: Secondary | ICD-10-CM | POA: Diagnosis present

## 2020-08-25 DIAGNOSIS — N1832 Chronic kidney disease, stage 3b: Secondary | ICD-10-CM | POA: Diagnosis present

## 2020-08-25 DIAGNOSIS — Z515 Encounter for palliative care: Secondary | ICD-10-CM | POA: Diagnosis not present

## 2020-08-25 DIAGNOSIS — E871 Hypo-osmolality and hyponatremia: Secondary | ICD-10-CM | POA: Diagnosis present

## 2020-08-25 DIAGNOSIS — Z8249 Family history of ischemic heart disease and other diseases of the circulatory system: Secondary | ICD-10-CM | POA: Diagnosis not present

## 2020-08-25 DIAGNOSIS — K219 Gastro-esophageal reflux disease without esophagitis: Secondary | ICD-10-CM | POA: Diagnosis present

## 2020-08-25 DIAGNOSIS — Z7984 Long term (current) use of oral hypoglycemic drugs: Secondary | ICD-10-CM | POA: Diagnosis not present

## 2020-08-25 DIAGNOSIS — Z880 Allergy status to penicillin: Secondary | ICD-10-CM | POA: Diagnosis not present

## 2020-08-25 DIAGNOSIS — Z833 Family history of diabetes mellitus: Secondary | ICD-10-CM | POA: Diagnosis not present

## 2020-08-25 DIAGNOSIS — Z841 Family history of disorders of kidney and ureter: Secondary | ICD-10-CM | POA: Diagnosis not present

## 2020-08-25 DIAGNOSIS — Z20822 Contact with and (suspected) exposure to covid-19: Secondary | ICD-10-CM | POA: Diagnosis present

## 2020-08-25 DIAGNOSIS — Z87891 Personal history of nicotine dependence: Secondary | ICD-10-CM | POA: Diagnosis not present

## 2020-08-25 DIAGNOSIS — Z79899 Other long term (current) drug therapy: Secondary | ICD-10-CM | POA: Diagnosis not present

## 2020-08-25 DIAGNOSIS — C787 Secondary malignant neoplasm of liver and intrahepatic bile duct: Secondary | ICD-10-CM | POA: Diagnosis present

## 2020-08-25 DIAGNOSIS — I129 Hypertensive chronic kidney disease with stage 1 through stage 4 chronic kidney disease, or unspecified chronic kidney disease: Secondary | ICD-10-CM | POA: Diagnosis present

## 2020-08-25 LAB — CBC
HCT: 39.3 % (ref 39.0–52.0)
Hemoglobin: 12.6 g/dL — ABNORMAL LOW (ref 13.0–17.0)
MCH: 25.7 pg — ABNORMAL LOW (ref 26.0–34.0)
MCHC: 32.1 g/dL (ref 30.0–36.0)
MCV: 80.2 fL (ref 80.0–100.0)
Platelets: 251 10*3/uL (ref 150–400)
RBC: 4.9 MIL/uL (ref 4.22–5.81)
RDW: 13.3 % (ref 11.5–15.5)
WBC: 12.5 10*3/uL — ABNORMAL HIGH (ref 4.0–10.5)
nRBC: 0 % (ref 0.0–0.2)

## 2020-08-25 LAB — BASIC METABOLIC PANEL
Anion gap: 8 (ref 5–15)
BUN: 40 mg/dL — ABNORMAL HIGH (ref 8–23)
CO2: 22 mmol/L (ref 22–32)
Calcium: 10.3 mg/dL (ref 8.9–10.3)
Chloride: 104 mmol/L (ref 98–111)
Creatinine, Ser: 1.6 mg/dL — ABNORMAL HIGH (ref 0.61–1.24)
GFR, Estimated: 44 mL/min — ABNORMAL LOW (ref >60)
Glucose, Bld: 152 mg/dL — ABNORMAL HIGH (ref 70–99)
Potassium: 4.8 mmol/L (ref 3.5–5.1)
Sodium: 134 mmol/L — ABNORMAL LOW (ref 135–145)

## 2020-08-25 LAB — SARS CORONAVIRUS 2 (TAT 6-24 HRS): SARS Coronavirus 2: NEGATIVE

## 2020-08-25 LAB — GLUCOSE, CAPILLARY
Glucose-Capillary: 138 mg/dL — ABNORMAL HIGH (ref 70–99)
Glucose-Capillary: 145 mg/dL — ABNORMAL HIGH (ref 70–99)
Glucose-Capillary: 181 mg/dL — ABNORMAL HIGH (ref 70–99)

## 2020-08-25 MED ORDER — METHYLNALTREXONE BROMIDE 12 MG/0.6ML ~~LOC~~ SOLN
8.0000 mg | Freq: Once | SUBCUTANEOUS | Status: AC
  Administered 2020-08-25: 11:00:00 8 mg via SUBCUTANEOUS
  Filled 2020-08-25: qty 0.6

## 2020-08-25 MED ORDER — HALOPERIDOL LACTATE 5 MG/ML IJ SOLN
5.0000 mg | Freq: Four times a day (QID) | INTRAMUSCULAR | Status: DC | PRN
  Administered 2020-08-25 – 2020-08-26 (×5): 5 mg via INTRAMUSCULAR
  Filled 2020-08-25 (×5): qty 1

## 2020-08-25 MED ORDER — CHLORHEXIDINE GLUCONATE CLOTH 2 % EX PADS
6.0000 | MEDICATED_PAD | Freq: Every day | CUTANEOUS | Status: DC
  Administered 2020-08-25 – 2020-08-26 (×2): 6 via TOPICAL

## 2020-08-25 NOTE — Assessment & Plan Note (Addendum)
#  80 year old male patient with newly diagnosed pancreatic adenocarcinoma cancer currently admitted hospital for mental status changes/constipation/worsening abdominal pain  # Pancreatic adenocarcinoma-stage IV liver biopsy; CA 19-9- >20,000. CT scan August 11, 2020 [ER]-multiple hypodense hepatic lesions; also 5.8 cm tail of pancreas mass; positive for gastrohepatic periportal retroperitoneal adenopathy; multiple peritoneal deposits.  Left posterior iliac sclerotic lesion.  #Abdominal pain-multifactorial-metastatic liver disease/constipation-continue judicious use of narcotics/while treating constipation.  #Constipation-secondary to narcotics; continue aggressive bowel protocol/consider use of Relistor.    #Mental status changes-likely delirium secondary to narcotics unlikely any metastatic disease to the brain.  #Agree with DNR/DNI  # Recommendations:  #A long discussion with the patient/daughter Rhonda-over the phone regarding the multiple challenges in providing chemotherapy.  I reviewed with the patient's daughter that patient has symptomatic disease/performance status of 3/risk of falls/mental status changes poorly controlled pain/constipation. In general given the only modest response about 20% from chemotherapy; and in general high risk of side effects of the chemo-I think is reasonable to consider hospice.  After lengthy discussion with daughter understands and agrees to prioritize quality of life over quantity of life.  They are interested in keeping the patient comfortable/hospice.  #I discussed the hospice philosophy with the daughter in detail.  Recommend evaluation with [facility] for helping with placement/hospice at patient's home.  We will cancel for placement.   Thank you Dr.Ayiku for allowing me to participate in the care of your pleasant patient. Please do not hesitate to contact me with questions or concerns in the interim.  Discussed with  Dr.Ayiku.  Discussed with [employer].

## 2020-08-25 NOTE — Consult Note (Signed)
[facility] NOTE  Patient Care Team: Baxter Hire, MD as PCP - General (Internal Medicine) Cammie Sickle, MD as Consulting Physician (Internal Medicine) Clent Jacks, RN as Oncology Nurse Navigator  CHIEF COMPLAINTS/PURPOSE OF CONSULTATION: Pancreatic cancer  HISTORY OF PRESENTING ILLNESS:  Ricky Riley 80 y.o.  male with newly diagnosed metastatic pancreatic cancer; stage III kidney disease is currently admitted to hospital for worsening abdominal pain.  Patient was evaluated in the cancer center yesterday for possible start of chemotherapy for his cancer.  However given his ongoing constipation/abdominal pain mental status changes/risk of fall he was sent to the emergency room for further evaluation.  Patient has been constipated for the last 4 to 5 days; most likely related to patient's use of opiates.  Patient had not had a bowel movement for the last 4 days.  Patient also complains of abdominal pain-related to his underlying cancer. Feels poorly.  No nausea no vomiting.  No fevers or chills.   Patient denies any pain.  However he is confused.   Review of Systems  Unable to perform ROS: Mental status change     MEDICAL HISTORY:  Past Medical History:  Diagnosis Date  . Arthritis   . Cancer associated pain   . Diabetes mellitus without complication ([place])   . Dysrhythmia   . GERD (gastroesophageal reflux disease)   . Headache   . Hypertension   . Sleep apnea    CPAP     SURGICAL HISTORY: Past Surgical History:  Procedure Laterality Date  . ARTERY BIOPSY Left 03/02/2015   Procedure: BIOPSY TEMPORAL ARTERY;  Surgeon: Clyde Canterbury, MD;  Location: [facility];  Service: ENT;  Laterality: Left;  . HERNIA REPAIR    . LEFT HEART CATH AND CORONARY ANGIOGRAPHY Left 01/27/2020   Procedure: LEFT HEART CATH AND CORONARY ANGIOGRAPHY;  Surgeon: Teodoro Spray, MD;  Location: [facility];  Service: Cardiovascular;  Laterality: Left;  .  PENILE PROSTHESIS IMPLANT    . TRANSURETHRAL RESECTION OF PROSTATE    . XI ROBOTIC ASSISTED INGUINAL HERNIA REPAIR WITH MESH Left 04/22/2019   Procedure: XI ROBOTIC ASSISTED INGUINAL HERNIA REPAIR WITH MESH;  Surgeon: Herbert Pun, MD;  Location: [facility];  Service: General;  Laterality: Left;    SOCIAL HISTORY: Social History   Socioeconomic History  . Marital status: Married    Spouse name: Not on file  . Number of children: Not on file  . Years of education: Not on file  . Highest education level: Not on file  Occupational History  . Not on file  Tobacco Use  . Smoking status: Former Smoker    Packs/day: 1.00    Types: Cigarettes    Quit date: 06/05/2009    Years since quitting: 11.2  . Smokeless tobacco: Never Used  Vaping Use  . Vaping Use: Never used  Substance and Sexual Activity  . Alcohol use: No  . Drug use: No  . Sexual activity: Not on file  Other Topics Concern  . Not on file  Social History Narrative   Lives in [place] with wife.  He is his wife's caregiver. Daughter-[place]; son- Vander. Used to work in Social worker jobs. Quit smoking in 2012. No alcohol.     Social Determinants of Health   Financial Resource Strain: Not on file  Food Insecurity: Not on file  Transportation Needs: Not on file  Physical Activity: Not on file  Stress: Not on file  Social Connections: Not  on file  Intimate Partner Violence: Not on file    FAMILY HISTORY: Family History  Problem Relation Age of Onset  . Diabetes Mother   . Heart disease Mother   . Diabetes Father   . Heart disease Father   . Cancer Father 38  . Diabetes Sister   . Diabetes Brother   . Kidney failure Brother     ALLERGIES:  is allergic to atorvastatin, flunisolide, lisinopril, rosuvastatin, ciprofloxacin, penicillins, sulfa antibiotics, and sulfasalazine.  MEDICATIONS:  Current Facility-Administered Medications  Medication Dose Route Frequency Provider Last Rate Last Admin  . 0.9  %  sodium chloride infusion   Intravenous Continuous Agbata, Tochukwu, MD      . 0.9 %  sodium chloride infusion   Intravenous Continuous Agbata, Tochukwu, MD   Paused at 08/25/20 0350  . acetaminophen (TYLENOL) tablet 500 mg  500 mg Oral Q6H PRN Agbata, Tochukwu, MD      . aspirin EC tablet 81 mg  81 mg Oral Daily Agbata, Tochukwu, MD   81 mg at 08/25/20 0927  . enoxaparin (LOVENOX) injection 40 mg  40 mg Subcutaneous Q24H Agbata, Tochukwu, MD   40 mg at 08/24/20 2200  . feeding supplement (GLUCERNA SHAKE) (GLUCERNA SHAKE) liquid 237 mL  237 mL Oral TID BM Agbata, Tochukwu, MD   237 mL at 08/24/20 2216  . fentaNYL (DURAGESIC) 12 MCG/HR 1 patch  1 patch Transdermal Q72H Agbata, Tochukwu, MD   1 patch at 08/24/20 1827  . finasteride (PROSCAR) tablet 5 mg  5 mg Oral Daily Agbata, Tochukwu, MD   5 mg at 08/25/20 0927  . fluticasone (FLONASE) 50 MCG/ACT nasal spray 2 spray  2 spray Each Nare Daily PRN Agbata, Tochukwu, MD      . loratadine (CLARITIN) tablet 10 mg  10 mg Oral Daily Agbata, Tochukwu, MD   10 mg at 08/25/20 0927  . methylnaltrexone (RELISTOR) injection 8 mg  8 mg Subcutaneous Once Jennye Boroughs, MD      . mirtazapine (REMERON) tablet 15 mg  15 mg Oral QHS PRN Agbata, Tochukwu, MD      . morphine 2 MG/ML injection 2 mg  2 mg Intravenous Q3H PRN Agbata, Tochukwu, MD   2 mg at 08/25/20 0240  . nystatin (MYCOSTATIN) 100000 UNIT/ML suspension 500,000 Units  5 mL Oral QID Agbata, Tochukwu, MD   500,000 Units at 08/25/20 0927  . ondansetron (ZOFRAN) tablet 4 mg  4 mg Oral Q6H PRN Agbata, Tochukwu, MD       Or  . ondansetron (ZOFRAN) injection 4 mg  4 mg Intravenous Q6H PRN Agbata, Tochukwu, MD      . pantoprazole (PROTONIX) EC tablet 40 mg  40 mg Oral Daily Agbata, Tochukwu, MD   40 mg at 08/25/20 0927  . sodium chloride (OCEAN) 0.65 % nasal spray 2 spray  2 spray Each Nare Daily Agbata, Tochukwu, MD   2 spray at 08/25/20 0932  . terazosin (HYTRIN) capsule 10 mg  10 mg Oral QHS Agbata,  Tochukwu, MD   10 mg at 08/24/20 2216  . traZODone (DESYREL) tablet 50 mg  50 mg Oral QHS PRN Agbata, Tochukwu, MD   50 mg at 08/25/20 0050   Facility-Administered Medications Ordered in Other Encounters  Medication Dose Route Frequency Provider Last Rate Last Admin  . 0.9 %  sodium chloride infusion  250 mL Intravenous PRN Teodoro Spray, MD      . 0.9% sodium chloride infusion  1 mL/kg/hr Intravenous Continuous Bartholome Bill  A, MD      . sodium chloride flush (NS) 0.9 % injection 3 mL  3 mL Intravenous Q12H Bartholome Bill A, MD      . sodium chloride flush (NS) 0.9 % injection 3 mL  3 mL Intravenous PRN Teodoro Spray, MD          .  PHYSICAL EXAMINATION:  Vitals:   08/25/20 0019 08/25/20 0408  BP: 119/65 115/72  Pulse: 92 (!) 101  Resp: 17 16  Temp: 97.6 F (36.4 C) 97.7 F (36.5 C)  SpO2: 100% 100%   Filed Weights   08/24/20 1006  Weight: 182 lb 15.7 oz (83 kg)    Physical Exam Constitutional:      Comments: Patient resting the bed alone.  HENT:     Head: Normocephalic and atraumatic.     Mouth/Throat:     Pharynx: No oropharyngeal exudate.  Eyes:     Pupils: Pupils are equal, round, and reactive to light.  Cardiovascular:     Rate and Rhythm: Normal rate and regular rhythm.  Pulmonary:     Effort: No respiratory distress.     Breath sounds: No wheezing.     Comments: Decreased breath sounds bilaterally the bases. Abdominal:     General: Bowel sounds are normal. There is no distension.     Palpations: Abdomen is soft. There is no mass.     Tenderness: There is no abdominal tenderness. There is no guarding or rebound.     Comments: Mild abdominal distention.  No acute abdominal tenderness.  Musculoskeletal:        General: No tenderness. Normal range of motion.     Cervical back: Normal range of motion and neck supple.  Skin:    General: Skin is warm.  Neurological:     Mental Status: He is alert.     Comments: Alert oriented times 0-1.  Psychiatric:         Mood and Affect: Affect normal.      LABORATORY DATA:  I have reviewed the data as listed Lab Results  Component Value Date   WBC 12.5 (H) 08/25/2020   HGB 12.6 (L) 08/25/2020   HCT 39.3 08/25/2020   MCV 80.2 08/25/2020   PLT 251 08/25/2020   Recent Labs    12/12/19 0836 12/13/19 0416 12/14/19 0641 08/11/20 1913 08/22/20 1322 08/24/20 0859 08/24/20 1051 08/25/20 0610  NA 131* 135 134*   < > 131* 134* 131* 134*  K 3.6 4.1 4.2   < > 4.7 4.5 4.7 4.8  CL 99 105 105   < > 98 103 101 104  CO2 22 23 23    < > 23 20* 21* 22  GLUCOSE 143* 109* 104*   < > 152* 148* 144* 152*  BUN 24* 21 20   < > 34* 37* 38* 40*  CREATININE 1.59* 1.37* 1.22   < > 1.72* 1.55* 1.67* 1.60*  CALCIUM 9.8 9.2 9.5   < > 10.3 10.4* 10.4* 10.3  GFRNONAA 41* 49* 57*   < > 40* 46* 42* 44*  GFRAA 48* 57* >60  --   --   --   --   --   PROT 7.6  --   --    < > 7.2 6.9 6.8  --   ALBUMIN 3.6  --   --    < > 3.3* 3.3* 3.2*  --   AST 17  --   --    < > 50*  53* 52*  --   ALT 11  --   --    < > 36 33 34  --   ALKPHOS 51  --   --    < > 217* 204* 204*  --   BILITOT 0.7  --   --    < > 1.6* 1.6* 1.5*  --    < > = values in this interval not displayed.    RADIOGRAPHIC STUDIES: I have personally reviewed the radiological images as listed and agreed with the findings in the report. CT ABDOMEN PELVIS W CONTRAST  Result Date: 08/11/2020 CLINICAL DATA:  Bilateral flank pain EXAM: CT ABDOMEN AND PELVIS WITH CONTRAST TECHNIQUE: Multidetector CT imaging of the abdomen and pelvis was performed using the standard protocol following bolus administration of intravenous contrast. CONTRAST:  139mL OMNIPAQUE IOHEXOL 300 MG/ML  SOLN COMPARISON:  CT 12/12/2019 FINDINGS: Lower chest: Lung bases demonstrate no acute consolidation or effusion. Probable scarring at the right middle lobe and right base. Borderline cardiomegaly. No significant pericardial effusion. Hepatobiliary: Interim development of numerous hypodense hepatic masses  concerning for metastatic disease. No calcified gallstone. No biliary dilatation. The gallbladder appears slightly thick walled. Pancreas: No inflammatory changes. Mild pancreatic ductal dilatation. Irregular hypodense mass at the tail of the pancreas measuring 5.8 by 3.1 by 3.4 cm. Spleen: Normal in size without focal abnormality. Adrenals/Urinary Tract: Adrenal glands are within normal limits. Kidneys show bilateral cysts. No hydronephrosis. The urinary bladder is unremarkable. Stomach/Bowel: The stomach is nonenlarged. No dilated small bowel. No acute bowel wall thickening. Negative appendix Vascular/Lymphatic: Mild aortic atherosclerosis. No aneurysmal dilatation. Poorly defined soft tissue density at the gastrohepatic ligament measuring approximately 3 x 1.5 cm concerning for metastatic disease. Periportal lymph nodes measuring up to 18 cm. 13 mm left retroperitoneal lymph node at the level of the SMA. Reproductive: Prostate unremarkable. Penile prosthesis with left pelvic deflated reservoir. Other: No free air. Small amount of abdominopelvic ascites. Small mesenteric nodules, for example 3 mm nodule anterior to the distal descending colon, series 2, image number 53. Multiple small soft tissue nodules in the right colic gutter. Multiple soft tissue nodules anterior to the left hepatic lobe measuring up to 17 mm, series 7, image number 17. Musculoskeletal: New lucent lesion with peripheral sclerosis in the posterior left ilium, series 2, image number 19 indeterminate for metastatic disease. IMPRESSION: 1. Interim development of numerous hypodense hepatic masses concerning for metastatic disease. 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 2. Gastrohepatic, periportal and retroperitoneal adenopathy also concerning for metastatic disease. Small amount of abdominopelvic ascites with multiple intraperitoneal nodules, concerning for  peritoneal metastatic disease. 3. New lucent lesion with peripheral sclerosis in the posterior left ilium, indeterminate for metastatic disease. Aortic Atherosclerosis (ICD10-I70.0). Electronically Signed   By: Donavan Foil M.D.   On: 08/11/2020 21:50   US BIOPSY (LIVER)  Result Date: 08/16/2020 INDICATION: Pancreatic mass and multiple liver lesions EXAM: Ultrasound-guided liver lesion biopsy MEDICATIONS: None. ANESTHESIA/SEDATION: Moderate (conscious) sedation was employed during this procedure. A total of Versed 1 mg and Fentanyl 50 mcg was administered intravenously. Moderate Sedation Time: The 15 minutes. The patient's level of consciousness and vital signs were monitored continuously by radiology nursing throughout the procedure under my direct supervision. COMPLICATIONS: None immediate. PROCEDURE: Informed written consent was obtained from the patient after a thorough discussion of the procedural risks, benefits and alternatives. All questions were addressed. Maximal Sterile Barrier Technique was utilized  including caps, mask, sterile gowns, sterile gloves, sterile drape, hand hygiene and skin antiseptic. A timeout was performed prior to the initiation of the procedure. Patient position left lateral decubitus on the ultrasound table. Right upper quadrant skin prepped and draped in usual sterile fashion. Following local lidocaine administration, 17 gauge introducer needle was advanced into 1 of the right liver lesions, and four 18 gauge cores were obtained utilizing continuous ultrasound guidance. Gelfoam slurry was administered through the introducer needle at the biopsy site. Samples were sent to pathology in formalin. Needle removed and hemostasis achieved with 5 minutes of manual compression. Post procedure ultrasound images showed no evidence of significant hemorrhage. IMPRESSION: Ultrasound-guided biopsy of right liver mass. Electronically Signed   By: Miachel Roux M.D.   On: 08/16/2020 10:34   DG  Abd 2 Views  Result Date: 08/18/2020 CLINICAL DATA:  Constipation due to opiate therapy. EXAM: ABDOMEN - 2 VIEW COMPARISON:  CT 08/11/2020 FINDINGS: No free intra-abdominal air moderate stool in the ascending, transverse, proximal descending colon. Small volume of stool in the distal descending and sigmoid colon. Enteric contrast from prior abdominal CT is present in the left colon. No small bowel dilatation or evidence of obstruction. 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 Linear right basilar atelectasis. IMPRESSION: Moderate stool in the colon. No evidence of bowel obstruction. Previous enteric contrast from prior abdominal CT is present in the left colon. Electronically Signed   By: Keith Rake M.D.   On: 08/18/2020 20:04    Cancer of pancreas, tail [facility]) #80 year old male patient with newly diagnosed pancreatic adenocarcinoma cancer currently admitted hospital for mental status changes/constipation/worsening abdominal pain  # Pancreatic adenocarcinoma-stage IV liver biopsy; CA 19-9- >20,000. CT scan August 11, 2020 [ER]-multiple hypodense hepatic lesions; also 5.8 cm tail of pancreas mass; positive for gastrohepatic periportal retroperitoneal adenopathy; multiple peritoneal deposits.  Left posterior iliac sclerotic lesion.  #Abdominal pain-multifactorial-metastatic liver disease/constipation-continue judicious use of narcotics/while treating constipation.  #Constipation-secondary to narcotics; continue aggressive bowel protocol/consider use of Relistor.    #Mental status changes-likely delirium secondary to narcotics unlikely any metastatic disease to the brain.  #Agree with DNR/DNI  # Recommendations:  #A long discussion with the patient/daughter Rhonda-over the phone regarding the multiple challenges in providing chemotherapy.  I reviewed with the patient's daughter that patient has symptomatic disease/performance status of 3/risk of falls/mental status changes poorly controlled pain/constipation.  In general given the only modest response about 20% from chemotherapy; and in general high risk of side effects of the chemo-I think is reasonable to consider hospice.  After lengthy discussion with daughter understands and agrees to prioritize quality of life over quantity of life.  They are interested in keeping the patient comfortable/hospice.  #I discussed the hospice philosophy with the daughter in detail.  Recommend evaluation with [facility] Borders for helping with placement/hospice at patient's home.  We will cancel for placement.   Thank you Dr.Ayiku for allowing me to participate in the care of your pleasant patient. Please do not hesitate to contact me with questions or concerns in the interim.  Discussed with  Dr.Ayiku.  Discussed with [employer].     All questions were answered. The patient knows to call the clinic with any problems, questions or concerns.       Cammie Sickle, MD 08/25/2020 10:58 AM

## 2020-08-25 NOTE — Progress Notes (Addendum)
Progress Note    Ricky Riley  CVE:[id number] DOB: 09-26-1940  DOA: 08/24/2020 PCP: Baxter Hire, MD      Brief Narrative:    Medical records reviewed and are as summarized below:  Ricky Riley is a 80 y.o. male with medical history significant for diabetes mellitus, hypertension, BPH, GERD, CKD stage IIIb, recently diagnosed stage IV pancreatic cancer, recent discharge from the hospital on 08/20/2020 after hospitalization for abdominal pain.  He was sent to the emergency room for evaluation of worsening abdominal pain likely related to his cancer.  He was admitted to the hospital for pain control.  He also had constipation requiring laxatives.  He developed acute urinary retention requiring placement of Foley catheter.    Assessment/Plan:   Principal Problem:   Cancer related pain Active Problems:   GERD (gastroesophageal reflux disease)   Cancer of pancreas, tail (HCC)   Constipation due to opioid therapy   CKD stage 3 secondary to diabetes ([place])   Acute urinary retention   Body mass index is 22.87 kg/m.    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  Appreciate input from Dr. Rogue Bussing, oncologist.  He had discussed goals of care with patient's daughter who is now contemplating hospice.  Appreciate input from [place], from palliative care team.  Hospice will be consulted.  Opioid-induced constipation: Other laxatives have not worked so we will try Relistor.  BPH with acute urinary retention: Patient retained urine again after previous in and out urethral catheterization.  Bladder scan this morning revealed 824 mls of urine.  Place Foley catheter.  Continue finasteride and terazosin.  Acute change in mental status, likely delirium: Continue supportive care.  Chronic hyponatremia: Sodium level is stable.  CKD stage IIIb: Creatinine  is stable  Other comorbidities include diabetes mellitus, hypertension, BPH, GERD,  Diet Order            Diet full liquid Room service appropriate? Yes; Fluid consistency: Thin  Diet effective now                    Consultants:  Oncologist  Palliative care  Procedures:  None    Medications:   . aspirin EC  81 mg Oral Daily  . Chlorhexidine Gluconate Cloth  6 each Topical Daily  . enoxaparin (LOVENOX) injection  40 mg Subcutaneous Q24H  . feeding supplement (GLUCERNA SHAKE)  237 mL Oral TID BM  . fentaNYL  1 patch Transdermal Q72H  . finasteride  5 mg Oral Daily  . loratadine  10 mg Oral Daily  . nystatin  5 mL Oral QID  . pantoprazole  40 mg Oral Daily  . sodium chloride  2 spray Each Nare Daily  . terazosin  10 mg Oral QHS   Continuous Infusions: . sodium chloride    . sodium chloride Stopped (08/25/20 0350)     Anti-infectives (From admission, onward)   None             Family Communication/Anticipated D/C date and plan/Code Status   DVT prophylaxis: enoxaparin (LOVENOX) injection 40 mg Start: 08/24/20 2200     Code Status: DNR  Family Communication: None Disposition Plan:    Status is: Observation  The patient will require care spanning > 2 midnights and should be moved to inpatient because: Inpatient level of care appropriate due to severity of illness  Dispo: The patient is from: Home  Anticipated d/c is to: Home              Patient currently is not medically stable to d/c.   Difficult to place patient No           Subjective:   Interval events noted.  In and out urethral catheterization was done for urinary retention but he still retaining urine again.  C/o abdominal pain and constipation.  Objective:    Vitals:   08/24/20 1656 08/24/20 2005 08/25/20 0019 08/25/20 0408  BP: (!) 140/95 138/73 119/65 115/72  Pulse: 82 82 92 (!) 101  Resp: 18 17 17 16   Temp: 97.6 F (36.4 C) 98.7 F (37.1 C) 97.6  F (36.4 C) 97.7 F (36.5 C)  TempSrc: Oral Oral Oral Oral  SpO2: 100% 100% 100% 100%  Weight:      Height:       No data found.   Intake/Output Summary (Last 24 hours) at 08/25/2020 1215 Last data filed at 08/25/2020 0350 Gross per 24 hour  Intake 902.5 ml  Output 810 ml  Net 92.5 ml   Filed Weights   08/24/20 1006  Weight: 83 kg    Exam:  GEN: NAD SKIN: No rash EYES: EOMI ENT: MMM CV: RRR PULM: CTA B ABD: soft, mild distension, mild tenderness, no rebound tenderness or guarding, +BS CNS: AAO x 1 (person only), confused, non focal EXT: No edema or tenderness        Data Reviewed:   I have personally reviewed following labs and imaging studies:  Labs: Labs show the following:   Basic Metabolic Panel: Recent Labs  Lab 08/20/20 0544 08/22/20 1322 08/24/20 0859 08/24/20 1051 08/25/20 0610  NA 135 131* 134* 131* 134*  K 4.6 4.7 4.5 4.7 4.8  CL 105 98 103 101 104  CO2 21* 23 20* 21* 22  GLUCOSE 130* 152* 148* 144* 152*  BUN 23 34* 37* 38* 40*  CREATININE 1.43* 1.72* 1.55* 1.67* 1.60*  CALCIUM 10.1 10.3 10.4* 10.4* 10.3   GFR Estimated Creatinine Clearance: 45.4 mL/min (A) (by C-G formula based on SCr of 1.6 mg/dL (H)). Liver Function Tests: Recent Labs  Lab 08/19/20 0546 08/20/20 0544 08/22/20 1322 08/24/20 0859 08/24/20 1051  AST 49* 44* 50* 53* 52*  ALT 41 36 36 33 34  ALKPHOS 194* 201* 217* 204* 204*  BILITOT 1.5* 1.7* 1.6* 1.6* 1.5*  PROT 6.3* 6.6 7.2 6.9 6.8  ALBUMIN 3.0* 3.0* 3.3* 3.3* 3.2*   Recent Labs  Lab 08/24/20 1051  LIPASE 20   No results for input(s): AMMONIA in the last 168 hours. Coagulation profile No results for input(s): INR, PROTIME in the last 168 hours.  CBC: Recent Labs  Lab 08/20/20 0544 08/22/20 1322 08/24/20 0859 08/24/20 1051 08/25/20 0610  WBC 12.2* 14.9* 17.4* 15.7* 12.5*  NEUTROABS  --  11.9* 14.0*  --   --   HGB 12.4* 12.9* 12.8* 12.3* 12.6*  HCT 36.7* 39.7 38.9* 37.8* 39.3  MCV 78.8* 80.2  78.6* 79.7* 80.2  PLT 221 274 274 270 251   Cardiac Enzymes: No results for input(s): CKTOTAL, CKMB, CKMBINDEX, TROPONINI in the last 168 hours. BNP (last 3 results) No results for input(s): PROBNP in the last 8760 hours. CBG: Recent Labs  Lab 08/19/20 1656 08/19/20 2014 08/20/20 0644 08/20/20 1126 08/25/20 0541  GLUCAP 113* 124* 138* 171* 138*   D-Dimer: No results for input(s): DDIMER in the last 72 hours. Hgb A1c: No results for input(s): HGBA1C in the  last 72 hours. Lipid Profile: No results for input(s): CHOL, HDL, LDLCALC, TRIG, CHOLHDL, LDLDIRECT in the last 72 hours. Thyroid function studies: No results for input(s): TSH, T4TOTAL, T3FREE, THYROIDAB in the last 72 hours.  Invalid input(s): FREET3 Anemia work up: No results for input(s): VITAMINB12, FOLATE, FERRITIN, TIBC, IRON, RETICCTPCT in the last 72 hours. Sepsis Labs: Recent Labs  Lab 08/22/20 1322 08/24/20 0859 08/24/20 1051 08/25/20 0610  WBC 14.9* 17.4* 15.7* 12.5*    Microbiology Recent Results (from the past 240 hour(s))  SARS CORONAVIRUS 2 (TAT 6-24 HRS) Nasopharyngeal Nasopharyngeal Swab     Status: None   Collection Time: 08/17/20  6:53 PM   Specimen: Nasopharyngeal Swab  Result Value Ref Range Status   SARS Coronavirus 2 NEGATIVE NEGATIVE Final    Comment: (NOTE) SARS-CoV-2 target nucleic acids are NOT DETECTED.  The SARS-CoV-2 RNA is generally detectable in upper and lower respiratory specimens during the acute phase of infection. Negative results do not preclude SARS-CoV-2 infection, do not rule out co-infections with other pathogens, and should not be used as the sole basis for treatment or other patient management decisions. Negative results must be combined with clinical observations, patient history, and epidemiological information. The expected result is Negative.  Fact Sheet for Patients: SugarRoll.be  Fact Sheet for Healthcare  Providers: https://www.woods-mathews.com/  This test is not yet approved or cleared by the [place] FDA and  has been authorized for detection and/or diagnosis of SARS-CoV-2 by FDA under an Emergency Use Authorization (EUA). This EUA will remain  in effect (meaning this test can be used) for the duration of the COVID-19 declaration under Se ction 564(b)(1) of the Act, 21 U.S.C. section 360bbb-3(b)(1), unless the authorization is terminated or revoked sooner.  Performed at [facility], [place] [address], [place], [place] [address]   Expectorated Sputum Assessment w Gram Stain, Rflx to Resp Cult     Status: None   Collection Time: 08/20/20  7:48 AM   Specimen: Sputum  Result Value Ref Range Status   Specimen Description SPUTUM  Final   Special Requests NONE  Final   Sputum evaluation   Final    Sputum specimen not acceptable for testing.  Please recollect.   RESULT CALLED TO, READ BACK BY AND VERIFIED WITH: A.PEREZ,RN AT 1152 ON 08/20/20 BY GM Performed at [facility], [place] [place], [place] [address]    Report Status 08/20/2020 FINAL  Final  SARS CORONAVIRUS 2 (TAT 6-24 HRS) Nasopharyngeal Nasopharyngeal Swab     Status: None   Collection Time: 08/24/20  1:27 PM   Specimen: Nasopharyngeal Swab  Result Value Ref Range Status   SARS Coronavirus 2 NEGATIVE NEGATIVE Final    Comment: (NOTE) SARS-CoV-2 target nucleic acids are NOT DETECTED.  The SARS-CoV-2 RNA is generally detectable in upper and lower respiratory specimens during the acute phase of infection. Negative results do not preclude SARS-CoV-2 infection, do not rule out co-infections with other pathogens, and should not be used as the sole basis for treatment or other patient management decisions. Negative results must be combined with clinical observations, patient history, and epidemiological information. The expected result is Negative.  Fact Sheet for  Patients: SugarRoll.be  Fact Sheet for Healthcare Providers: https://www.woods-mathews.com/  This test is not yet approved or cleared by the [place] FDA and  has been authorized for detection and/or diagnosis of SARS-CoV-2 by FDA under an Emergency Use Authorization (EUA). This EUA will remain  in effect (meaning this test  can be used) for the duration of the COVID-19 declaration under Se ction 564(b)(1) of the Act, 21 U.S.C. section 360bbb-3(b)(1), unless the authorization is terminated or revoked sooner.  Performed at [facility], [place] [address] [place], [place] [address]     Procedures and diagnostic studies:  No results found.             LOS: 0 days   Skye Plamondon  [facility]   Pager on [email or web address]. If 7PM-7AM, please contact night-coverage at [email or web address]     08/25/2020, 12:15 PM

## 2020-08-25 NOTE — Consult Note (Signed)
[place]  [phone] [phone]   Name: Ricky Riley Date: 08/25/2020 MRN: [id number]  DOB: 03/08/1941  Patient Care Team: Baxter Hire, MD as PCP - General (Internal Medicine) Cammie Sickle, MD as Consulting Physician (Internal Medicine) Clent Jacks, RN as Oncology Nurse Navigator    REASON FOR CONSULTATION: Ricky Riley is a 80 y.o. 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  Patient was hospitalized 08/17/2020 -08/20/2020 with worsening abdominal pain.  Pain was improved after adjustment to his medications.  Patient was readmitted 08/24/2020 with same.  Palliative care was consulted to help address goals and manage ongoing symptoms.  SOCIAL HISTORY:     reports that he quit smoking about 11 years ago. His smoking use included cigarettes. He smoked 1.00 pack per day. He has never used smokeless tobacco. He reports that he does not drink alcohol and does not use drugs.  Patient is married and lives at home with his wife.  Patient has a son and daughter who are involved in his care.  He is retired and previously worked as a Furniture conservator/restorer.  ADVANCE DIRECTIVES:  None on file  CODE STATUS: DNR/DNI  PAST MEDICAL HISTORY: Past Medical History:  Diagnosis Date  . Arthritis   . Cancer associated pain   . Diabetes mellitus without complication ([place])   . Dysrhythmia   . GERD (gastroesophageal reflux disease)   . Headache   . Hypertension   . Sleep apnea    CPAP     PAST SURGICAL HISTORY:  Past Surgical History:  Procedure Laterality Date  . ARTERY BIOPSY Left 03/02/2015   Procedure: BIOPSY TEMPORAL ARTERY;  Surgeon: Clyde Canterbury, MD;  Location: [facility];  Service: ENT;  Laterality: Left;  . HERNIA REPAIR    . LEFT HEART  CATH AND CORONARY ANGIOGRAPHY Left 01/27/2020   Procedure: LEFT HEART CATH AND CORONARY ANGIOGRAPHY;  Surgeon: Teodoro Spray, MD;  Location: [facility];  Service: Cardiovascular;  Laterality: Left;  . PENILE PROSTHESIS IMPLANT    . TRANSURETHRAL RESECTION OF PROSTATE    . XI ROBOTIC ASSISTED INGUINAL HERNIA REPAIR WITH MESH Left 04/22/2019   Procedure: XI ROBOTIC ASSISTED INGUINAL HERNIA REPAIR WITH MESH;  Surgeon: Herbert Pun, MD;  Location: [facility];  Service: General;  Laterality: Left;    HEMATOLOGY/ONCOLOGY HISTORY:  Oncology History  Cancer of pancreas, tail ([place])  08/19/2020 Initial Diagnosis   Primary cancer of tail of pancreas ([place])   08/19/2020 Cancer Staging   Staging form: Exocrine Pancreas, AJCC 8th Edition - Clinical: Stage IV (cT3, cN2, pM1) - Signed by Cammie Sickle, MD on 08/19/2020   08/19/2020 -  Chemotherapy    Patient is on Treatment Plan: PANCREATIC ABRAXANE / GEMCITABINE D1,8,15 Q28D        ALLERGIES:  is allergic to atorvastatin, flunisolide, lisinopril, rosuvastatin, ciprofloxacin, penicillins, sulfa antibiotics, and sulfasalazine.  MEDICATIONS:  Current Facility-Administered Medications  Medication Dose Route Frequency Provider Last Rate Last Admin  . 0.9 %  sodium chloride infusion   Intravenous Continuous Agbata, Tochukwu, MD      . 0.9 %  sodium chloride infusion   Intravenous Continuous Agbata, Tochukwu, MD   Paused at 08/25/20 0350  . acetaminophen (TYLENOL) tablet 500 mg  500 mg Oral Q6H PRN Collier Bullock, MD      .  aspirin EC tablet 81 mg  81 mg Oral Daily Agbata, Tochukwu, MD   81 mg at 08/25/20 0927  . enoxaparin (LOVENOX) injection 40 mg  40 mg Subcutaneous Q24H Agbata, Tochukwu, MD   40 mg at 08/24/20 2200  . feeding supplement (GLUCERNA SHAKE) (GLUCERNA SHAKE) liquid 237 mL  237 mL Oral TID BM Agbata, Tochukwu, MD   237 mL at 08/24/20 2216  . fentaNYL (DURAGESIC) 12 MCG/HR 1 patch  1 patch Transdermal Q72H Agbata,  Tochukwu, MD   1 patch at 08/24/20 1827  . finasteride (PROSCAR) tablet 5 mg  5 mg Oral Daily Agbata, Tochukwu, MD   5 mg at 08/25/20 0927  . fluticasone (FLONASE) 50 MCG/ACT nasal spray 2 spray  2 spray Each Nare Daily PRN Agbata, Tochukwu, MD      . loratadine (CLARITIN) tablet 10 mg  10 mg Oral Daily Agbata, Tochukwu, MD   10 mg at 08/25/20 0927  . mirtazapine (REMERON) tablet 15 mg  15 mg Oral QHS PRN Agbata, Tochukwu, MD      . morphine 2 MG/ML injection 2 mg  2 mg Intravenous Q3H PRN Agbata, Tochukwu, MD   2 mg at 08/25/20 6283  . nystatin (MYCOSTATIN) 100000 UNIT/ML suspension 500,000 Units  5 mL Oral QID Agbata, Tochukwu, MD   500,000 Units at 08/25/20 0927  . ondansetron (ZOFRAN) tablet 4 mg  4 mg Oral Q6H PRN Agbata, Tochukwu, MD       Or  . ondansetron (ZOFRAN) injection 4 mg  4 mg Intravenous Q6H PRN Agbata, Tochukwu, MD      . pantoprazole (PROTONIX) EC tablet 40 mg  40 mg Oral Daily Agbata, Tochukwu, MD   40 mg at 08/25/20 0927  . sodium chloride (OCEAN) 0.65 % nasal spray 2 spray  2 spray Each Nare Daily Agbata, Tochukwu, MD   2 spray at 08/25/20 0932  . terazosin (HYTRIN) capsule 10 mg  10 mg Oral QHS Agbata, Tochukwu, MD   10 mg at 08/24/20 2216  . traZODone (DESYREL) tablet 50 mg  50 mg Oral QHS PRN Agbata, Tochukwu, MD   50 mg at 08/25/20 0050   Facility-Administered Medications Ordered in Other Encounters  Medication Dose Route Frequency Provider Last Rate Last Admin  . 0.9 %  sodium chloride infusion  250 mL Intravenous PRN Teodoro Spray, MD      . 0.9% sodium chloride infusion  1 mL/kg/hr Intravenous Continuous Teodoro Spray, MD      . sodium chloride flush (NS) 0.9 % injection 3 mL  3 mL Intravenous Q12H Teodoro Spray, MD      . sodium chloride flush (NS) 0.9 % injection 3 mL  3 mL Intravenous PRN Teodoro Spray, MD        VITAL SIGNS: BP 115/72 (BP Location: Left Arm)   Pulse (!) 101   Temp 97.7 F (36.5 C) (Oral)   Resp 16   Ht 6\' 3"  (1.905 m)   Wt 182 lb  15.7 oz (83 kg)   SpO2 100%   BMI 22.87 kg/m  Filed Weights   08/24/20 1006  Weight: 182 lb 15.7 oz (83 kg)    Estimated body mass index is 22.87 kg/m as calculated from the following:   Height as of this encounter: 6\' 3"  (1.905 m).   Weight as of this encounter: 182 lb 15.7 oz (83 kg).  LABS: CBC:    Component Value Date/Time   WBC 12.5 (H) 08/25/2020 0610   HGB  12.6 (L) 08/25/2020 0610   HCT 39.3 08/25/2020 0610   PLT 251 08/25/2020 0610   MCV 80.2 08/25/2020 0610   NEUTROABS 14.0 (H) 08/24/2020 0859   LYMPHSABS 1.1 08/24/2020 0859   MONOABS 1.7 (H) 08/24/2020 0859   EOSABS 0.4 08/24/2020 0859   BASOSABS 0.1 08/24/2020 0859   Comprehensive Metabolic Panel:    Component Value Date/Time   NA 134 (L) 08/25/2020 0610   K 4.8 08/25/2020 0610   CL 104 08/25/2020 0610   CO2 22 08/25/2020 0610   BUN 40 (H) 08/25/2020 0610   CREATININE 1.60 (H) 08/25/2020 0610   GLUCOSE 152 (H) 08/25/2020 0610   CALCIUM 10.3 08/25/2020 0610   AST 52 (H) 08/24/2020 1051   ALT 34 08/24/2020 1051   ALKPHOS 204 (H) 08/24/2020 1051   BILITOT 1.5 (H) 08/24/2020 1051   PROT 6.8 08/24/2020 1051   ALBUMIN 3.2 (L) 08/24/2020 1051    RADIOGRAPHIC STUDIES: CT ABDOMEN PELVIS W CONTRAST  Result Date: 08/11/2020 CLINICAL DATA:  Bilateral flank pain EXAM: CT ABDOMEN AND PELVIS WITH CONTRAST TECHNIQUE: Multidetector CT imaging of the abdomen and pelvis was performed using the standard protocol following bolus administration of intravenous contrast. CONTRAST:  174mL OMNIPAQUE IOHEXOL 300 MG/ML  SOLN COMPARISON:  CT 12/12/2019 FINDINGS: Lower chest: Lung bases demonstrate no acute consolidation or effusion. Probable scarring at the right middle lobe and right base. Borderline cardiomegaly. No significant pericardial effusion. Hepatobiliary: Interim development of numerous hypodense hepatic masses concerning for metastatic disease. No calcified gallstone. No biliary dilatation. The gallbladder appears slightly  thick walled. Pancreas: No inflammatory changes. Mild pancreatic ductal dilatation. Irregular hypodense mass at the tail of the pancreas measuring 5.8 by 3.1 by 3.4 cm. Spleen: Normal in size without focal abnormality. Adrenals/Urinary Tract: Adrenal glands are within normal limits. Kidneys show bilateral cysts. No hydronephrosis. The urinary bladder is unremarkable. Stomach/Bowel: The stomach is nonenlarged. No dilated small bowel. No acute bowel wall thickening. Negative appendix Vascular/Lymphatic: Mild aortic atherosclerosis. No aneurysmal dilatation. Poorly defined soft tissue density at the gastrohepatic ligament measuring approximately 3 x 1.5 cm concerning for metastatic disease. Periportal lymph nodes measuring up to 18 cm. 13 mm left retroperitoneal lymph node at the level of the SMA. Reproductive: Prostate unremarkable. Penile prosthesis with left pelvic deflated reservoir. Other: No free air. Small amount of abdominopelvic ascites. Small mesenteric nodules, for example 3 mm nodule anterior to the distal descending colon, series 2, image number 53. Multiple small soft tissue nodules in the right colic gutter. Multiple soft tissue nodules anterior to the left hepatic lobe measuring up to 17 mm, series 7, image number 17. Musculoskeletal: New lucent lesion with peripheral sclerosis in the posterior left ilium, series 2, image number 34 indeterminate for metastatic disease. IMPRESSION: 1. Interim development of numerous hypodense hepatic masses concerning for metastatic disease. 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 2. Gastrohepatic, periportal and retroperitoneal adenopathy also concerning for metastatic disease. 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 3. New lucent lesion with peripheral sclerosis in the posterior left ilium, indeterminate  for metastatic disease. Aortic Atherosclerosis (ICD10-I70.0). Electronically Signed   By: Donavan Foil M.D.   On: 08/11/2020 21:50   US BIOPSY (LIVER)  Result Date: 08/16/2020 INDICATION: Pancreatic mass and multiple liver lesions EXAM: Ultrasound-guided liver lesion biopsy MEDICATIONS: None. ANESTHESIA/SEDATION: Moderate (conscious) sedation was employed during this procedure. A total of Versed 1 mg  and Fentanyl 50 mcg was administered intravenously. Moderate Sedation Time: The 15 minutes. The patient's level of consciousness and vital signs were monitored continuously by radiology nursing throughout the procedure under my direct supervision. COMPLICATIONS: None immediate. PROCEDURE: Informed written consent was obtained from the patient after a thorough discussion of the procedural risks, benefits and alternatives. All questions were addressed. Maximal Sterile Barrier Technique was utilized including caps, mask, sterile gowns, sterile gloves, sterile drape, hand hygiene and skin antiseptic. A timeout was performed prior to the initiation of the procedure. Patient position left lateral decubitus on the ultrasound table. Right upper quadrant skin prepped and draped in usual sterile fashion. Following local lidocaine administration, 17 gauge introducer needle was advanced into 1 of the right liver lesions, and four 18 gauge cores were obtained utilizing continuous ultrasound guidance. Gelfoam slurry was administered through the introducer needle at the biopsy site. Samples were sent to pathology in formalin. Needle removed and hemostasis achieved with 5 minutes of manual compression. Post procedure ultrasound images showed no evidence of significant hemorrhage. IMPRESSION: Ultrasound-guided biopsy of right liver mass. Electronically Signed   By: Miachel Roux M.D.   On: 08/16/2020 10:34   DG Abd 2 Views  Result Date: 08/18/2020 CLINICAL DATA:  Constipation due to opiate therapy. EXAM: ABDOMEN - 2 VIEW  COMPARISON:  CT 08/11/2020 FINDINGS: No free intra-abdominal air moderate stool in the ascending, transverse, proximal descending colon. Small volume of stool in the distal descending and sigmoid colon. Enteric contrast from prior abdominal CT is present in the left colon. No small bowel dilatation or evidence of obstruction. 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 Linear right basilar atelectasis. IMPRESSION: Moderate stool in the colon. No evidence of bowel obstruction. Previous enteric contrast from prior abdominal CT is present in the left colon. Electronically Signed   By: Keith Rake M.D.   On: 08/18/2020 20:04    PERFORMANCE STATUS (ECOG) : 2 - Symptomatic, <50% confined to bed  Review of Systems Unless otherwise noted, a complete review of systems is negative.  Physical Exam General: NAD Cardiovascular: regular rate and rhythm Pulmonary: Unlabored Abdomen: Slightly distended, nontender, + bowel sounds Extremities: no edema, no joint deformities Skin: no rashes Neurological: Weakness, confused  IMPRESSION: Patient is confused this morning but denies any distressing symptoms.  He thought he was in the hospital in [place] and was admitted to pursue abdominal surgery.  I was unable to have a meaningful conversation with patient regarding his goals due to confusion.  I called and spoke with patient's daughter by phone.  She has also talked with Dr. Rogue Bussing who is recommended hospice.  Daughter verbalized understanding that chemotherapy would potentially worsen patient's frailty and exacerbate overall decline.  She said she would prefer just to focus on keeping patient at home and comfortable and to maximize quality of life with hospice.  However, daughter plans to speak later today with her mother and brother and will let us know what they decide.  PLAN: -Continue current scope of treatment -Recommend abdominal ultrasound to rule out ascites -Continue transdermal fentanyl -Recommend  restarting oxycodone as needed for breakthrough pain -Bowel regimen for constipation -DNR/DNI -Family are considering the option for hospice care at time of discharge  Case and plan discussed with Dr. Rogue Bussing   Time Total: 60 minutes  Visit consisted of counseling and education dealing with the complex and emotionally intense issues of symptom management and palliative care in the setting of serious and potentially life-threatening illness.Greater than 50%  of this time  was spent counseling and coordinating care related to the above assessment and plan.  Signed by: Altha Harm, PhD, NP-C

## 2020-08-26 ENCOUNTER — Ambulatory Visit: Admission: RE | Admit: 2020-08-26 | Payer: Medicare Other | Source: Ambulatory Visit

## 2020-08-26 DIAGNOSIS — Z66 Do not resuscitate: Secondary | ICD-10-CM | POA: Diagnosis not present

## 2020-08-26 DIAGNOSIS — C252 Malignant neoplasm of tail of pancreas: Secondary | ICD-10-CM | POA: Diagnosis not present

## 2020-08-26 DIAGNOSIS — K5900 Constipation, unspecified: Secondary | ICD-10-CM | POA: Diagnosis not present

## 2020-08-26 DIAGNOSIS — Z515 Encounter for palliative care: Secondary | ICD-10-CM

## 2020-08-26 DIAGNOSIS — G893 Neoplasm related pain (acute) (chronic): Secondary | ICD-10-CM | POA: Diagnosis not present

## 2020-08-26 LAB — CBC WITH DIFFERENTIAL/PLATELET
Abs Immature Granulocytes: 0.15 10*3/uL — ABNORMAL HIGH (ref 0.00–0.07)
Basophils Absolute: 0 10*3/uL (ref 0.0–0.1)
Basophils Relative: 0 %
Eosinophils Absolute: 0.2 10*3/uL (ref 0.0–0.5)
Eosinophils Relative: 1 %
HCT: 37 % — ABNORMAL LOW (ref 39.0–52.0)
Hemoglobin: 12.5 g/dL — ABNORMAL LOW (ref 13.0–17.0)
Immature Granulocytes: 1 %
Lymphocytes Relative: 5 %
Lymphs Abs: 0.7 10*3/uL (ref 0.7–4.0)
MCH: 26.7 pg (ref 26.0–34.0)
MCHC: 33.8 g/dL (ref 30.0–36.0)
MCV: 78.9 fL — ABNORMAL LOW (ref 80.0–100.0)
Monocytes Absolute: 1.4 10*3/uL — ABNORMAL HIGH (ref 0.1–1.0)
Monocytes Relative: 9 %
Neutro Abs: 14 10*3/uL — ABNORMAL HIGH (ref 1.7–7.7)
Neutrophils Relative %: 84 %
Platelets: 243 10*3/uL (ref 150–400)
RBC: 4.69 MIL/uL (ref 4.22–5.81)
RDW: 13.3 % (ref 11.5–15.5)
WBC: 16.5 10*3/uL — ABNORMAL HIGH (ref 4.0–10.5)
nRBC: 0 % (ref 0.0–0.2)

## 2020-08-26 LAB — BASIC METABOLIC PANEL
Anion gap: 7 (ref 5–15)
BUN: 46 mg/dL — ABNORMAL HIGH (ref 8–23)
CO2: 20 mmol/L — ABNORMAL LOW (ref 22–32)
Calcium: 10.1 mg/dL (ref 8.9–10.3)
Chloride: 108 mmol/L (ref 98–111)
Creatinine, Ser: 1.72 mg/dL — ABNORMAL HIGH (ref 0.61–1.24)
GFR, Estimated: 40 mL/min — ABNORMAL LOW (ref >60)
Glucose, Bld: 146 mg/dL — ABNORMAL HIGH (ref 70–99)
Potassium: 4.8 mmol/L (ref 3.5–5.1)
Sodium: 135 mmol/L (ref 135–145)

## 2020-08-26 LAB — GLUCOSE, CAPILLARY: Glucose-Capillary: 127 mg/dL — ABNORMAL HIGH (ref 70–99)

## 2020-08-26 MED ORDER — METHYLNALTREXONE BROMIDE 12 MG/0.6ML ~~LOC~~ SOLN
12.0000 mg | SUBCUTANEOUS | Status: DC
  Administered 2020-08-26: 10:00:00 12 mg via SUBCUTANEOUS
  Filled 2020-08-26 (×2): qty 0.6

## 2020-08-26 NOTE — Discharge Planning (Signed)
Patient now on 3B 332 waiting on transport to go to Hospice.  Family in room.  Haldol IM just given (to assist with transport and a calm transition.  Sitter also at bedside for consistancy from floor.  IV x2 and foley to be left intact per floor RN report and Hospice request) Report also indicated they already s/w hospice home and gave report to [employer].  Transport to arrive between 5 and 6PM

## 2020-08-26 NOTE — TOC Initial Note (Signed)
Transition of Care [facility]) - Initial/Assessment Note    Patient Details  Name: Ricky Riley MRN: [id number] Date of Birth: 1941-01-11  [facility]) CM/SW Contact:    Shelbie Hutching, RN Phone Number: 08/26/2020, 10:29 AM  Clinical Narrative:                 Family has decided that they would like for the patient to be comfortable and go to residential hospice.  Kieth Brightly with [facility] given referral from [facility] with Palliative.    Expected Discharge Plan: Hospice Medical Facility Barriers to Discharge: Other (comment) (hospice referral)   Patient Goals and CMS Choice Patient states their goals for this hospitalization and ongoing recovery are:: Family chooses Residential hospice CMS Medicare.gov Compare Post Acute Care list provided to:: Patient Represenative (must comment) Choice offered to / list presented to : Adult Children,Spouse  Expected Discharge Plan and Services Expected Discharge Plan: [place]   Discharge Planning Services: CM Consult Post Acute Care Choice: Hospice Living arrangements for the past 2 months: Single Family Home                 DME Arranged: N/A DME Agency: NA       HH Arranged: NA HH Agency: NA        Prior Living Arrangements/Services Living arrangements for the past 2 months: Single Family Home Lives with:: Spouse Patient language and need for interpreter reviewed:: Yes Do you feel safe going back to the place where you live?: Yes      Need for Family Participation in Patient Care: Yes (Comment) Care giver support system in place?: Yes (comment)   Criminal Activity/Legal Involvement Pertinent to Current Situation/Hospitalization: No - Comment as needed  Activities of Daily Living Home Assistive Devices/Equipment: Shower chair with back ADL Screening (condition at time of admission) Patient's cognitive ability adequate to safely complete daily activities?: Yes Is the patient deaf or have difficulty  hearing?: No Does the patient have difficulty seeing, even when wearing glasses/contacts?: No Does the patient have difficulty concentrating, remembering, or making decisions?: No Patient able to express need for assistance with ADLs?: Yes Does the patient have difficulty dressing or bathing?: No Independently performs ADLs?: Yes (appropriate for developmental age) Does the patient have difficulty walking or climbing stairs?: No Weakness of Legs: None Weakness of Arms/Hands: None  Permission Sought/Granted Permission sought to share information with : Case Manager,Facility Contact Representative,Family Supports Permission granted to share information with : Yes, Verbal Permission Granted     Permission granted to share info w AGENCY: [facility]  Permission granted to share info w Relationship: family     Emotional Assessment         Alcohol / Substance Use: Not Applicable Psych Involvement: No (comment)  Admission diagnosis:  Cancer related pain [G89.3] Patient Active Problem List   Diagnosis Date Noted  . Acute urinary retention 08/25/2020  . Cancer related pain 08/24/2020  . CKD stage 3 secondary to diabetes ([place]) 08/24/2020  . Cancer of pancreas, tail ([place]) 08/19/2020  . Goals of care, counseling/discussion 08/19/2020  . Constipation due to opioid therapy   . Palliative care encounter   . Abdominal pain 08/17/2020  . Pancreatic mass 08/17/2020  . Lesion of liver 08/12/2020  . Pain due to onychomycosis of toenails of both feet 03/31/2020  . Lobar pneumonia ([place]) 12/13/2019  . CAP (community acquired pneumonia) 12/12/2019  . Transaminitis 12/12/2019  . GERD (gastroesophageal reflux disease) 12/12/2019  .  Acute renal failure superimposed on stage 3a chronic kidney disease ([place]) 12/12/2019  . Type II diabetes mellitus with renal manifestations ([place]) 12/12/2019  . Left inguinal hernia 04/22/2019  . Closed fracture of body of sternum with delayed healing  04/30/2017  . SOB (shortness of breath) 03/22/2017  . Chest pain, musculoskeletal 02/05/2017  . Acute pain of right shoulder 01/11/2017  . Calf cramp 12/31/2016  . Right thyroid nodule 04/20/2016  . Primary osteoarthritis of right knee 03/30/2016  . Abnormal CT scan, neck 03/21/2016  . Hypercalcemia 03/12/2016  . Chronic cough 01/23/2016  . Current chronic use of systemic steroids 01/23/2016  . Headache 01/26/2015  . Heart palpitations 03/17/2014  . Hypertension 03/17/2014  . Sleep apnea 03/17/2014  . Male erectile dysfunction 01/15/2013  . 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 Prostatitis, chronic 01/14/2012   PCP:  Baxter Hire, MD Pharmacy:   [id number] - [place], [address] [place] [address] Phone: [phone] Fax: [phone]  [facility] [place] [place] [facility] [place] [address] [place] [address] Phone: [phone] Fax: [phone]     Social Determinants of Health (SDOH) Interventions    Readmission Risk Interventions No flowsheet data found.

## 2020-08-26 NOTE — Discharge Summary (Addendum)
Physician Discharge Summary  EMIR NACK FFM:[id number] DOB: 1941/05/02 DOA: 08/24/2020  PCP: Baxter Hire, MD  Admit date: 08/24/2020 Discharge date: 08/26/2020  Discharge disposition: Hospice facility   Recommendations for Outpatient Follow-Up:   Follow-up with hospice team within 24 hours of discharge   Discharge Diagnosis:   Principal Problem:   Cancer related pain Active Problems:   GERD (gastroesophageal reflux disease)   Cancer of pancreas, tail (HCC)   Constipation due to opioid therapy   CKD stage 3 secondary to diabetes ([place])   Acute urinary retention    Discharge Condition: Stable.  Diet recommendation:  Diet Order            Diet general           Diet full liquid Room service appropriate? Yes; Fluid consistency: Thin  Diet effective now                   Code Status: DNR     Hospital Course:   Mr. Ricky Riley is a 80 y.o. male with medical history significant for diabetes mellitus, hypertension, BPH, GERD, CKD stage IIIb, recently diagnosed stage IV pancreatic cancer, recent discharge from the hospital on 08/20/2020 after hospitalization for abdominal pain.  He was sent to the emergency room for evaluation of worsening abdominal pain likely related to his cancer.  He was admitted to the hospital for pain control.  He also had constipation requiring laxatives.  He developed acute urinary retention requiring placement of Foley catheter.  Hospital course was complicated by delirium associated with severe agitation.  Oncologist and palliative care team were consulted.  Patient's daughter opted for comfort measures with hospice because of poor prognosis.  Hospice team was consulted and patient is deemed appropriate for discharge to the hospice house today.  Discharge plan was discussed with her daughter, Ms. Johny Drilling, over the phone.     Medical Consultants:    Oncologist  Palliative care team  Hospice team   Discharge Exam:     Vitals:   08/25/20 2019 08/26/20 0015 08/26/20 0607 08/26/20 0853  BP: 119/64 (!) 161/70 126/90 122/79  Pulse: 93 99 94 88  Resp: 19 20 18 18   Temp: 98.3 F (36.8 C) 98.3 F (36.8 C) 97.8 F (36.6 C)   TempSrc:   Oral   SpO2: 98% 99% 100% 99%  Weight:      Height:         GEN: NAD SKIN: No rash EYES: EOMI ENT: MMM CV: RRR PULM: CTA B ABD: soft, mild distension, NT, +BS CNS: Alert but confused, non focal EXT: No edema or tenderness   The results of significant diagnostics from this hospitalization (including imaging, microbiology, ancillary and laboratory) are listed below for reference.     Procedures and Diagnostic Studies:   No results found.   Labs:   Basic Metabolic Panel: Recent Labs  Lab 08/22/20 1322 08/24/20 0859 08/24/20 1051 08/25/20 0610 08/26/20 0448  NA 131* 134* 131* 134* 135  K 4.7 4.5 4.7 4.8 4.8  CL 98 103 101 104 108  CO2 23 20* 21* 22 20*  GLUCOSE 152* 148* 144* 152* 146*  BUN 34* 37* 38* 40* 46*  CREATININE 1.72* 1.55* 1.67* 1.60* 1.72*  CALCIUM 10.3 10.4* 10.4* 10.3 10.1   GFR Estimated Creatinine Clearance: 42.2 mL/min (A) (by C-G formula based on SCr of 1.72 mg/dL (H)). Liver Function Tests: Recent Labs  Lab 08/20/20 0544 08/22/20 1322 08/24/20 0859 08/24/20 1051  AST 44* 50* 53* 52*  ALT 36 36 33 34  ALKPHOS 201* 217* 204* 204*  BILITOT 1.7* 1.6* 1.6* 1.5*  PROT 6.6 7.2 6.9 6.8  ALBUMIN 3.0* 3.3* 3.3* 3.2*   Recent Labs  Lab 08/24/20 1051  LIPASE 20   No results for input(s): AMMONIA in the last 168 hours. Coagulation profile No results for input(s): INR, PROTIME in the last 168 hours.  CBC: Recent Labs  Lab 08/22/20 1322 08/24/20 0859 08/24/20 1051 08/25/20 0610 08/26/20 0448  WBC 14.9* 17.4* 15.7* 12.5* 16.5*  NEUTROABS 11.9* 14.0*  --   --  14.0*  HGB 12.9* 12.8* 12.3* 12.6* 12.5*  HCT 39.7 38.9* 37.8* 39.3 37.0*  MCV 80.2 78.6* 79.7* 80.2 78.9*  PLT 274 274 270 251 243   Cardiac  Enzymes: No results for input(s): CKTOTAL, CKMB, CKMBINDEX, TROPONINI in the last 168 hours. BNP: Invalid input(s): POCBNP CBG: Recent Labs  Lab 08/20/20 1126 08/25/20 0541 08/25/20 1221 08/25/20 1553 08/26/20 0855  GLUCAP 171* 138* 145* 181* 127*   D-Dimer No results for input(s): DDIMER in the last 72 hours. Hgb A1c No results for input(s): HGBA1C in the last 72 hours. Lipid Profile No results for input(s): CHOL, HDL, LDLCALC, TRIG, CHOLHDL, LDLDIRECT in the last 72 hours. Thyroid function studies No results for input(s): TSH, T4TOTAL, T3FREE, THYROIDAB in the last 72 hours.  Invalid input(s): FREET3 Anemia work up No results for input(s): VITAMINB12, FOLATE, FERRITIN, TIBC, IRON, RETICCTPCT in the last 72 hours. Microbiology Recent Results (from the past 240 hour(s))  SARS CORONAVIRUS 2 (TAT 6-24 HRS) Nasopharyngeal Nasopharyngeal Swab     Status: None   Collection Time: 08/17/20  6:53 PM   Specimen: Nasopharyngeal Swab  Result Value Ref Range Status   SARS Coronavirus 2 NEGATIVE NEGATIVE Final    Comment: (NOTE) SARS-CoV-2 target nucleic acids are NOT DETECTED.  The SARS-CoV-2 RNA is generally detectable in upper and lower respiratory specimens during the acute phase of infection. Negative results do not preclude SARS-CoV-2 infection, do not rule out co-infections with other pathogens, and should not be used as the sole basis for treatment or other patient management decisions. Negative results must be combined with clinical observations, patient history, and epidemiological information. The expected result is Negative.  Fact Sheet for Patients: SugarRoll.be  Fact Sheet for Healthcare Providers: https://www.woods-mathews.com/  This test is not yet approved or cleared by the [place] FDA and  has been authorized for detection and/or diagnosis of SARS-CoV-2 by FDA under an Emergency Use Authorization (EUA). This EUA  will remain  in effect (meaning this test can be used) for the duration of the COVID-19 declaration under Se ction 564(b)(1) of the Act, 21 U.S.C. section 360bbb-3(b)(1), unless the authorization is terminated or revoked sooner.  Performed at [facility], [place] [address] [place], [place] [address]   Expectorated Sputum Assessment w Gram Stain, Rflx to Resp Cult     Status: None   Collection Time: 08/20/20  7:48 AM   Specimen: Sputum  Result Value Ref Range Status   Specimen Description SPUTUM  Final   Special Requests NONE  Final   Sputum evaluation   Final    Sputum specimen not acceptable for testing.  Please recollect.   RESULT CALLED TO, READ BACK BY AND VERIFIED WITH: A.PEREZ,RN AT 7782 ON 08/20/20 BY GM Performed at [facility], [place] [place], [place] [address]    Report Status 08/20/2020 FINAL  Final  SARS CORONAVIRUS 2 (  TAT 6-24 HRS) Nasopharyngeal Nasopharyngeal Swab     Status: None   Collection Time: 08/24/20  1:27 PM   Specimen: Nasopharyngeal Swab  Result Value Ref Range Status   SARS Coronavirus 2 NEGATIVE NEGATIVE Final    Comment: (NOTE) SARS-CoV-2 target nucleic acids are NOT DETECTED.  The SARS-CoV-2 RNA is generally detectable in upper and lower respiratory specimens during the acute phase of infection. Negative results do not preclude SARS-CoV-2 infection, do not rule out co-infections with other pathogens, and should not be used as the sole basis for treatment or other patient management decisions. Negative results must be combined with clinical observations, patient history, and epidemiological information. The expected result is Negative.  Fact Sheet for Patients: SugarRoll.be  Fact Sheet for Healthcare Providers: https://www.woods-mathews.com/  This test is not yet approved or cleared by the [place] FDA and  has been authorized for detection and/or diagnosis of SARS-CoV-2  by FDA under an Emergency Use Authorization (EUA). This EUA will remain  in effect (meaning this test can be used) for the duration of the COVID-19 declaration under Se ction 564(b)(1) of the Act, 21 U.S.C. section 360bbb-3(b)(1), unless the authorization is terminated or revoked sooner.  Performed at [facility], [place] [address], [place], [place] [address]      Discharge Instructions:   Discharge Instructions    Diet general   Complete by: As directed      Allergies as of 08/26/2020      Reactions   Atorvastatin Other (See Comments)   Flunisolide    Lisinopril Hives   Rosuvastatin    Other reaction(s): Cramp in lower limb   Ciprofloxacin Hives   Penicillins Hives   Did it involve swelling of the face/tongue/throat, SOB, or low BP? No Did it involve sudden or severe rash/hives, skin peeling, or any reaction on the inside of your mouth or nose? Unknown Did you need to seek medical attention at a hospital or doctor's office? No When did it last happen?30-40 years ago If all above answers are "NO", may proceed with cephalosporin use.   Sulfa Antibiotics Hives   Sulfasalazine Hives      Medication List    STOP taking these medications   aspirin 81 MG EC tablet   finasteride 5 MG tablet Commonly known as: PROSCAR   fluticasone 50 MCG/ACT nasal spray Commonly known as: FLONASE   hydrochlorothiazide 25 MG tablet Commonly known as: HYDRODIURIL   losartan 25 MG tablet Commonly known as: COZAAR   metFORMIN 500 MG tablet Commonly known as: GLUCOPHAGE   nystatin 100000 UNIT/ML suspension Commonly known as: MYCOSTATIN   sodium chloride 0.65 % nasal spray Commonly known as: OCEAN   terazosin 10 MG capsule Commonly known as: HYTRIN   Thera-Gesic 0.5-15 % Crea     TAKE these medications   acetaminophen 500 MG tablet Commonly known as: TYLENOL Take 500 mg by mouth every 6 (six) hours as needed for moderate pain or headache.   cetirizine 10 MG  tablet Commonly known as: ZYRTEC Take 10 mg by mouth daily as needed for allergies.   fentaNYL 12 MCG/HR Commonly known as: [place] 1 patch onto the skin every 3 (three) days.   Lactulose 20 GM/30ML Soln Take 15 mLs (10 g total) by mouth 2 (two) times daily as needed (constipation).   mirtazapine 15 MG tablet Commonly known as: REMERON Take 15 mg by mouth at bedtime as needed (sleep).   omeprazole 20 MG capsule Commonly known as: PRILOSEC  Take 20 mg by mouth daily before breakfast.   ondansetron 4 MG disintegrating tablet Commonly known as: Zofran ODT Take 1 tablet (4 mg total) by mouth every 8 (eight) hours as needed for nausea or vomiting.   oxyCODONE 5 MG immediate release tablet Commonly known as: Oxy IR/ROXICODONE Take 1-2 tablets (5-10 mg total) by mouth every 4 (four) hours as needed for moderate pain or severe pain.   senna-docusate 8.6-50 MG tablet Commonly known as: Senokot-S Take 1 tablet by mouth 2 (two) times daily.   traZODone 50 MG tablet Commonly known as: DESYREL Take 50 mg by mouth daily as needed for sleep.         Time coordinating discharge: 31 minutes  Signed:  Karry Barrilleaux  [facility] 08/26/2020, 1:58 PM   Pager on [email or web address]. If 7PM-7AM, please contact night-coverage at [email or web address]

## 2020-08-26 NOTE — Progress Notes (Signed)
[name]  [phone] [phone]   Name: Ricky Riley Date: 08/26/2020 MRN: [id number]  DOB: 06/26/1940  Patient Care Team: Ricky Hire, MD as PCP - General (Internal Medicine) Ricky Sickle, MD as Consulting Physician (Internal Medicine) Ricky Jacks, RN as Oncology Nurse Navigator    REASON FOR CONSULTATION: Ricky Riley is a 80 y.o. 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  Patient was hospitalized 08/17/2020 -08/20/2020 with worsening abdominal pain.  Pain was improved after adjustment to his medications.  Patient was readmitted 08/24/2020 with same.  Palliative care was consulted to help address goals and manage ongoing symptoms..    CODE STATUS: DNR  PAST MEDICAL HISTORY: Past Medical History:  Diagnosis Date  . Arthritis   . Cancer associated pain   . Diabetes mellitus without complication ([place])   . Dysrhythmia   . GERD (gastroesophageal reflux disease)   . Headache   . Hypertension   . Sleep apnea    CPAP     PAST SURGICAL HISTORY:  Past Surgical History:  Procedure Laterality Date  . ARTERY BIOPSY Left 03/02/2015   Procedure: BIOPSY TEMPORAL ARTERY;  Surgeon: Ricky Canterbury, MD;  Location: [facility];  Service: ENT;  Laterality: Left;  . HERNIA REPAIR    . LEFT HEART CATH AND CORONARY ANGIOGRAPHY Left 01/27/2020   Procedure: LEFT HEART CATH AND CORONARY ANGIOGRAPHY;  Surgeon: Ricky Spray, MD;  Location: [facility];  Service: Cardiovascular;  Laterality: Left;  . PENILE PROSTHESIS IMPLANT    . TRANSURETHRAL RESECTION OF PROSTATE    . XI ROBOTIC ASSISTED INGUINAL HERNIA REPAIR WITH MESH Left 04/22/2019   Procedure: XI ROBOTIC ASSISTED INGUINAL HERNIA REPAIR WITH MESH;  Surgeon: Ricky Pun, MD;  Location: [facility];  Service: General;  Laterality: Left;    HEMATOLOGY/ONCOLOGY HISTORY:  Oncology History  Cancer of pancreas, tail (Ricky Riley)  08/19/2020 Initial Diagnosis   Primary cancer of tail of pancreas (Ricky Riley)   08/19/2020 Cancer Staging   Staging form: Exocrine Pancreas, AJCC 8th Edition - Clinical: Stage IV (cT3, cN2, pM1) - Signed by Ricky Sickle, MD on 08/19/2020   08/19/2020 -  Chemotherapy    Patient is on Treatment Plan: PANCREATIC ABRAXANE / GEMCITABINE D1,8,15 Q28D        ALLERGIES:  is allergic to atorvastatin, flunisolide, lisinopril, rosuvastatin, ciprofloxacin, penicillins, sulfa antibiotics, and sulfasalazine.  MEDICATIONS:  Current Facility-Administered Medications  Medication Dose Route Frequency Provider Last Rate Last Admin  . 0.9 %  sodium chloride infusion   Intravenous Continuous Agbata, Tochukwu, MD      . 0.9 %  sodium chloride infusion   Intravenous Continuous Agbata, Tochukwu, MD 75 mL/hr at 08/26/20 0113 New Bag at 08/26/20 0113  . acetaminophen (TYLENOL) tablet 500 mg  500 mg Oral Q6H PRN Agbata, Tochukwu, MD      . aspirin EC tablet 81 mg  81 mg Oral Daily Agbata, Tochukwu, MD   81 mg at 08/26/20 0933  . Chlorhexidine Gluconate Cloth 2 % PADS 6 each  6 each Topical Daily Ricky Boroughs, MD   6 each at 08/26/20 0934  . enoxaparin (LOVENOX) injection 40 mg  40 mg Subcutaneous Q24H Agbata, Tochukwu, MD   40 mg at 08/25/20 2103  . feeding supplement (GLUCERNA SHAKE) (GLUCERNA SHAKE) liquid 237  mL  237 mL Oral TID BM Agbata, Tochukwu, MD   237 mL at 08/26/20 0934  . fentaNYL (DURAGESIC) 12 MCG/HR 1 patch  1 patch Transdermal Q72H Agbata, Tochukwu, MD   1 patch at 08/24/20 1827  . finasteride (PROSCAR) tablet 5 mg  5 mg Oral Daily Agbata, Tochukwu, MD   5 mg at 08/26/20 0933  . fluticasone (FLONASE) 50 MCG/ACT nasal Riley 2 Riley  2 Riley Each Ricky Riley Daily PRN Agbata, Tochukwu, MD      . haloperidol lactate (HALDOL) injection 5 mg  5 mg  Intramuscular Q6H PRN Ricky Boroughs, MD   5 mg at 08/26/20 0313  . loratadine (CLARITIN) tablet 10 mg  10 mg Oral Daily Agbata, Tochukwu, MD   10 mg at 08/26/20 0933  . methylnaltrexone (RELISTOR) injection 12 mg  12 mg Subcutaneous Q24H Ricky Boroughs, MD   12 mg at 08/26/20 0934  . mirtazapine (REMERON) tablet 15 mg  15 mg Oral QHS PRN Agbata, Tochukwu, MD      . morphine 2 MG/ML injection 2 mg  2 mg Intravenous Q3H PRN Agbata, Tochukwu, MD   2 mg at 08/26/20 0934  . nystatin (MYCOSTATIN) 100000 UNIT/ML suspension 500,000 Units  5 mL Oral QID Agbata, Tochukwu, MD   500,000 Units at 08/26/20 0933  . ondansetron (ZOFRAN) tablet 4 mg  4 mg Oral Q6H PRN Agbata, Tochukwu, MD       Or  . ondansetron (ZOFRAN) injection 4 mg  4 mg Intravenous Q6H PRN Agbata, Tochukwu, MD      . pantoprazole (PROTONIX) EC tablet 40 mg  40 mg Oral Daily Agbata, Tochukwu, MD   40 mg at 08/26/20 0934  . sodium chloride (OCEAN) 0.65 % nasal Riley 2 Riley  2 Riley Each Ricky Riley Daily Agbata, Tochukwu, MD   2 Riley at 08/26/20 0934  . terazosin (HYTRIN) capsule 10 mg  10 mg Oral QHS Agbata, Tochukwu, MD   10 mg at 08/25/20 2104  . traZODone (DESYREL) tablet 50 mg  50 mg Oral QHS PRN Agbata, Tochukwu, MD   50 mg at 08/25/20 0050   Facility-Administered Medications Ordered in Other Encounters  Medication Dose Route Frequency Provider Last Rate Last Admin  . 0.9 %  sodium chloride infusion  250 mL Intravenous PRN Ricky Spray, MD      . 0.9% sodium chloride infusion  1 mL/kg/hr Intravenous Continuous Ricky Spray, MD      . sodium chloride flush (NS) 0.9 % injection 3 mL  3 mL Intravenous Q12H Ricky Spray, MD      . sodium chloride flush (NS) 0.9 % injection 3 mL  3 mL Intravenous PRN Ricky Spray, MD        VITAL SIGNS: BP 122/79 (BP Location: Left Arm)   Pulse 88   Temp 97.8 F (36.6 C) (Oral)   Resp 18   Ht 6\' 3"  (1.905 m)   Wt 182 lb 15.7 oz (83 kg)   SpO2 99%   BMI 22.87 kg/m  Filed Weights    08/24/20 1006  Weight: 182 lb 15.7 oz (83 kg)    Estimated body mass index is 22.87 kg/m as calculated from the following:   Height as of this encounter: 6\' 3"  (1.905 m).   Weight as of this encounter: 182 lb 15.7 oz (83 kg).  LABS: CBC:    Component Value Date/Time   WBC 16.5 (H) 08/26/2020 0448   HGB 12.5 (L) 08/26/2020 0448   HCT  37.0 (L) 08/26/2020 0448   PLT 243 08/26/2020 0448   MCV 78.9 (L) 08/26/2020 0448   NEUTROABS 14.0 (H) 08/26/2020 0448   LYMPHSABS 0.7 08/26/2020 0448   MONOABS 1.4 (H) 08/26/2020 0448   EOSABS 0.2 08/26/2020 0448   BASOSABS 0.0 08/26/2020 0448   Comprehensive Metabolic Panel:    Component Value Date/Time   NA 135 08/26/2020 0448   K 4.8 08/26/2020 0448   CL 108 08/26/2020 0448   CO2 20 (L) 08/26/2020 0448   BUN 46 (H) 08/26/2020 0448   CREATININE 1.72 (H) 08/26/2020 0448   GLUCOSE 146 (H) 08/26/2020 0448   CALCIUM 10.1 08/26/2020 0448   AST 52 (H) 08/24/2020 1051   ALT 34 08/24/2020 1051   ALKPHOS 204 (H) 08/24/2020 1051   BILITOT 1.5 (H) 08/24/2020 1051   PROT 6.8 08/24/2020 1051   ALBUMIN 3.2 (L) 08/24/2020 1051    RADIOGRAPHIC STUDIES: CT ABDOMEN PELVIS W CONTRAST  Result Date: 08/11/2020 CLINICAL DATA:  Bilateral flank pain EXAM: CT ABDOMEN AND PELVIS WITH CONTRAST TECHNIQUE: Multidetector CT imaging of the abdomen and pelvis was performed using the standard protocol following bolus administration of intravenous contrast. CONTRAST:  154mL OMNIPAQUE IOHEXOL 300 MG/ML  SOLN COMPARISON:  CT 12/12/2019 FINDINGS: Lower chest: Lung bases demonstrate no acute consolidation or effusion. Probable scarring at the right middle lobe and right base. Borderline cardiomegaly. No significant pericardial effusion. Hepatobiliary: Interim development of numerous hypodense hepatic masses concerning for metastatic disease. No calcified gallstone. No biliary dilatation. The gallbladder appears slightly thick walled. Pancreas: No inflammatory changes. Mild  pancreatic ductal dilatation. Irregular hypodense mass at the tail of the pancreas measuring 5.8 by 3.1 by 3.4 cm. Spleen: Normal in size without focal abnormality. Adrenals/Urinary Tract: Adrenal glands are within normal limits. Kidneys show bilateral cysts. No hydronephrosis. The urinary bladder is unremarkable. Stomach/Bowel: The stomach is nonenlarged. No dilated small bowel. No acute bowel wall thickening. Negative appendix Vascular/Lymphatic: Mild aortic atherosclerosis. No aneurysmal dilatation. Poorly defined soft tissue density at the gastrohepatic ligament measuring approximately 3 x 1.5 cm concerning for metastatic disease. Periportal lymph nodes measuring up to 18 cm. 13 mm left retroperitoneal lymph node at the level of the SMA. Reproductive: Prostate unremarkable. Penile prosthesis with left pelvic deflated reservoir. Other: No free air. Small amount of abdominopelvic ascites. Small mesenteric nodules, for example 3 mm nodule anterior to the distal descending colon, series 2, image number 53. Multiple small soft tissue nodules in the right colic gutter. Multiple soft tissue nodules anterior to the left hepatic lobe measuring up to 17 mm, series 7, image number 17. Musculoskeletal: New lucent lesion with peripheral sclerosis in the posterior left ilium, series 2, image number 4 indeterminate for metastatic disease. IMPRESSION: 1. Interim development of numerous hypodense hepatic masses concerning for metastatic disease. 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 2. Gastrohepatic, periportal and retroperitoneal adenopathy also concerning for metastatic disease. 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 3. New lucent lesion with peripheral sclerosis in the posterior left ilium, indeterminate for metastatic disease. Aortic Atherosclerosis  (ICD10-I70.0). Electronically Signed   By: Donavan Foil M.D.   On: 08/11/2020 21:50   US BIOPSY (LIVER)  Result Date: 08/16/2020 INDICATION: Pancreatic mass and multiple liver lesions EXAM: Ultrasound-guided liver lesion biopsy MEDICATIONS: None. ANESTHESIA/SEDATION: Moderate (conscious) sedation was employed during this procedure. A total of Versed 1 mg and Fentanyl 50 mcg was  administered intravenously. Moderate Sedation Time: The 15 minutes. The patient's level of consciousness and vital signs were monitored continuously by radiology nursing throughout the procedure under my direct supervision. COMPLICATIONS: None immediate. PROCEDURE: Informed written consent was obtained from the patient after a thorough discussion of the procedural risks, benefits and alternatives. All questions were addressed. Maximal Sterile Barrier Technique was utilized including caps, mask, sterile gowns, sterile gloves, sterile drape, hand hygiene and skin antiseptic. A timeout was performed prior to the initiation of the procedure. Patient position left lateral decubitus on the ultrasound table. Right upper quadrant skin prepped and draped in usual sterile fashion. Following local lidocaine administration, 17 gauge introducer needle was advanced into 1 of the right liver lesions, and four 18 gauge cores were obtained utilizing continuous ultrasound guidance. Gelfoam slurry was administered through the introducer needle at the biopsy site. Samples were sent to pathology in formalin. Needle removed and hemostasis achieved with 5 minutes of manual compression. Post procedure ultrasound images showed no evidence of significant hemorrhage. IMPRESSION: Ultrasound-guided biopsy of right liver mass. Electronically Signed   By: Miachel Roux M.D.   On: 08/16/2020 10:34   DG Abd 2 Views  Result Date: 08/18/2020 CLINICAL DATA:  Constipation due to opiate therapy. EXAM: ABDOMEN - 2 VIEW COMPARISON:  CT 08/11/2020 FINDINGS: No free  intra-abdominal air moderate stool in the ascending, transverse, proximal descending colon. Small volume of stool in the distal descending and sigmoid colon. Enteric contrast from prior abdominal CT is present in the left colon. No small bowel dilatation or evidence of obstruction. 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 Linear right basilar atelectasis. IMPRESSION: Moderate stool in the colon. No evidence of bowel obstruction. Previous enteric contrast from prior abdominal CT is present in the left colon. Electronically Signed   By: Keith Rake M.D.   On: 08/18/2020 20:04    PERFORMANCE STATUS (ECOG) : 4 - Bedbound  Review of Systems Unable to complete  Physical Exam General: Ill-appearing Pulmonary: Unlabored Extremities: no edema, no joint deformities Skin: no rashes Neurological: Weakness, confusion  IMPRESSION: Patient is more confused this morning.  Reportedly, and agitated last night and required as needed Haldol.  He currently has a Actuary at bedside.  I called and spoke with patient's daughter.  She states that family discussed options yesterday and believe that he will require too much care to take home with hospice.  Instead, they are requesting transfer to hospice facility.  Daughter verbalized understanding that care at [facility] will consist solely of comfort care.  She verbalized agreement with this and said family only wanted to keep patient comfortable at this point.  Will consult hospice liaison to coordinate.  PLAN: -Best supportive care -Hospice IPU when bed is available -Consider olanzapine 10mg  qhs for agitation  Case and plan discussed with Dr. Mal Misty    Time Total: 25 minutes  Visit consisted of counseling and education dealing with the complex and emotionally intense issues of symptom management and palliative care in the setting of serious and potentially life-threatening illness.Greater than 50%  of this time was spent counseling and coordinating care related to the  above assessment and plan.  Signed by: Altha Harm, PhD, NP-C

## 2020-08-26 NOTE — Progress Notes (Addendum)
[place] [facility]) Hospital Liaison RN note:  Received request from Altha Harm, NP for family interest in [facility]. Chart reviewed and eligibility was approved. Spoke with daughter, Suanne Marker over the phone to confirm interests and explain services. She would like to docusign consents today. Hospice Home will have a room this evening. Transport can be arranged for 6 pm. Hospital care team is aware.  Please call with any hospice related questions or concerns.  Thank you for the opportunity to participate in this patient's care.  Zandra Abts, RN [facility] Liaison [phone]

## 2020-08-26 NOTE — TOC Transition Note (Signed)
Transition of Care [facility]) - CM/SW Discharge Note   Patient Details  Name: Ricky Riley MRN: [id number] Date of Birth: 04-23-1941  Transition of Care [facility]) CM/SW Contact:  Shelbie Hutching, RN Phone Number: 08/26/2020, 3:16 PM   Clinical Narrative:    Patient has been accepted to [facility] residential hospice.  [name] has a bed today and they request transport to be set up for 6pm.  [facility] can arrive at 5:30 pm, hospice home is agreeable to this time.     Final next level of care: [place] Barriers to Discharge: Barriers Resolved   Patient Goals and CMS Choice Patient states their goals for this hospitalization and ongoing recovery are:: Family chooses Residential hospice CMS Medicare.gov Compare Post Acute Care list provided to:: Patient Represenative (must comment) Choice offered to / list presented to : Adult Children,Spouse  Discharge Placement              Patient chooses bed at:  [facility]) Patient to be transferred to facility by: [facility]   Patient and family notified of of transfer: 08/26/20  Discharge Plan and Services   Discharge Planning Services: CM Consult Post Acute Care Choice: Hospice          DME Arranged: N/A DME Agency: NA       HH Arranged: NA HH Agency: NA        Social Determinants of Health (SDOH) Interventions     Readmission Risk Interventions Readmission Risk Prevention Plan 08/26/2020  Transportation Screening Complete  Medication Review Press photographer) Complete  PCP or Specialist appointment within 3-5 days of discharge Complete  HRI or Home Care Consult Complete  SW Recovery Care/Counseling Consult Complete  Palliative Care Screening Complete  Skilled Nursing Facility Complete  Some recent data might be hidden

## 2020-08-29 ENCOUNTER — Other Ambulatory Visit: Payer: Self-pay | Admitting: *Deleted

## 2020-08-29 MED ORDER — LACTULOSE 20 GM/30ML PO SOLN: 10.0000 g | Freq: Two times a day (BID) | ORAL | 1 refills | Status: AC | PRN

## 2020-08-30 ENCOUNTER — Ambulatory Visit: Payer: Medicare Other | Admitting: Pain Medicine

## 2020-08-30 DIAGNOSIS — F528 Other sexual dysfunction not due to a substance or known physiological condition: Secondary | ICD-10-CM | POA: Insufficient documentation

## 2020-08-30 DIAGNOSIS — E1140 Type 2 diabetes mellitus with diabetic neuropathy, unspecified: Secondary | ICD-10-CM | POA: Insufficient documentation

## 2020-08-30 DIAGNOSIS — E1165 Type 2 diabetes mellitus with hyperglycemia: Secondary | ICD-10-CM | POA: Insufficient documentation

## 2020-08-30 DIAGNOSIS — M315 Giant cell arteritis with polymyalgia rheumatica: Secondary | ICD-10-CM | POA: Insufficient documentation

## 2020-08-30 DIAGNOSIS — I10 Essential (primary) hypertension: Secondary | ICD-10-CM | POA: Insufficient documentation

## 2020-08-30 DIAGNOSIS — G4733 Obstructive sleep apnea (adult) (pediatric): Secondary | ICD-10-CM | POA: Insufficient documentation

## 2020-08-30 DIAGNOSIS — E119 Type 2 diabetes mellitus without complications: Secondary | ICD-10-CM | POA: Insufficient documentation

## 2020-08-30 DIAGNOSIS — F4312 Post-traumatic stress disorder, chronic: Secondary | ICD-10-CM | POA: Insufficient documentation

## 2020-08-30 DIAGNOSIS — Z789 Other specified health status: Secondary | ICD-10-CM | POA: Insufficient documentation

## 2020-08-30 DIAGNOSIS — Z23 Encounter for immunization: Secondary | ICD-10-CM | POA: Insufficient documentation

## 2020-08-30 DIAGNOSIS — F341 Dysthymic disorder: Secondary | ICD-10-CM | POA: Insufficient documentation

## 2020-08-30 DIAGNOSIS — Z0120 Encounter for dental examination and cleaning without abnormal findings: Secondary | ICD-10-CM | POA: Insufficient documentation

## 2020-08-30 DIAGNOSIS — E1142 Type 2 diabetes mellitus with diabetic polyneuropathy: Secondary | ICD-10-CM | POA: Insufficient documentation

## 2020-08-30 DIAGNOSIS — K041 Necrosis of pulp: Secondary | ICD-10-CM | POA: Insufficient documentation

## 2020-08-30 DIAGNOSIS — K029 Dental caries, unspecified: Secondary | ICD-10-CM | POA: Insufficient documentation

## 2020-08-30 DIAGNOSIS — F32A Depression, unspecified: Secondary | ICD-10-CM | POA: Insufficient documentation

## 2020-08-30 DIAGNOSIS — R0981 Nasal congestion: Secondary | ICD-10-CM | POA: Insufficient documentation

## 2020-08-31 ENCOUNTER — Telehealth: Payer: Self-pay | Admitting: *Deleted

## 2020-08-31 NOTE — Telephone Encounter (Signed)
Contacted patient's daughter, Johny Drilling, to inform her that the FMLA papers were at the front desk for her to pick up. She explained that her Father passed away and the FMLA is no longer needed.

## 2020-09-01 ENCOUNTER — Encounter: Payer: Self-pay | Admitting: Internal Medicine

## 2020-09-09 DEATH — deceased

## 2021-06-14 NOTE — Telephone Encounter (Signed)
Error

## 2022-01-06 IMAGING — US US BIOPSY CORE LIVER
1 series · 15 of 25 positions shown · non-contrast
Comparison: none

INDICATION: Pancreatic mass and multiple liver lesions

[Series 1: us biopsy · 15 of 35 slices shown]
[im 1/35]
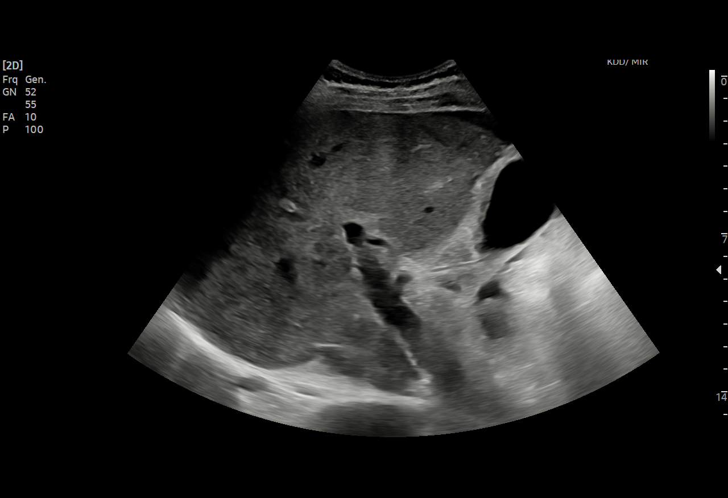
[im 3/35]
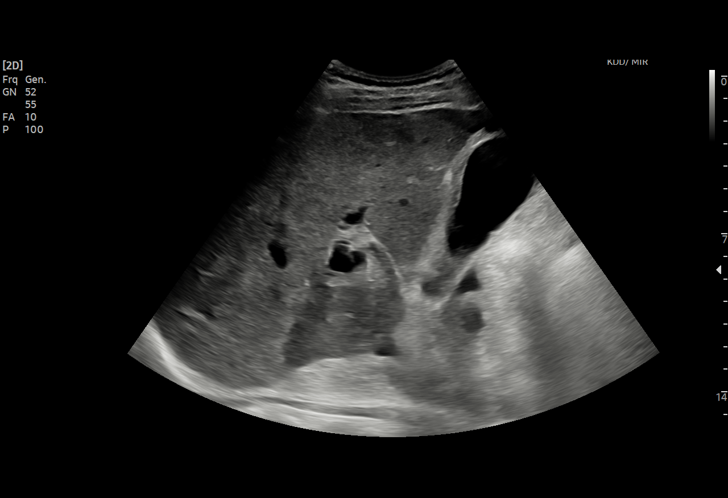
[im 6/35]
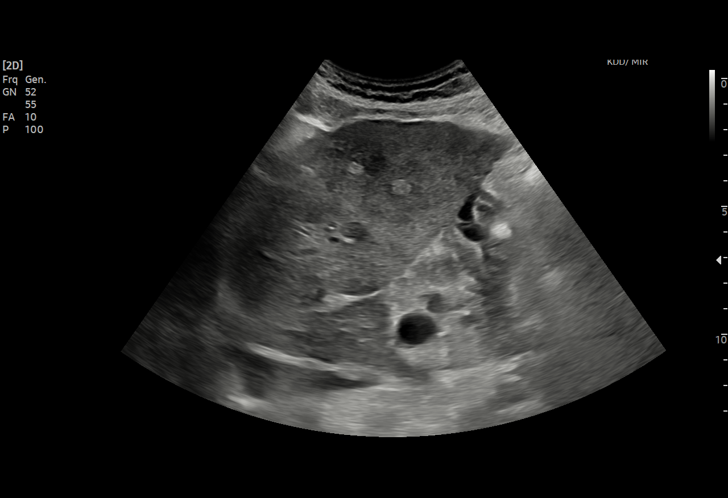
[im 8/35]
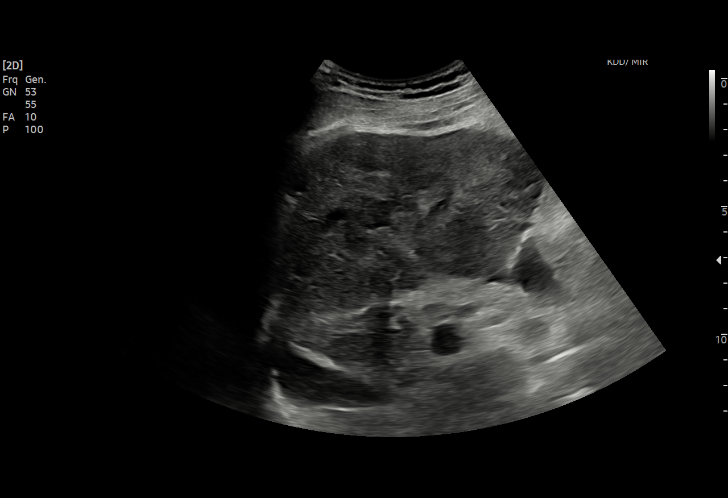
[im 10/35]
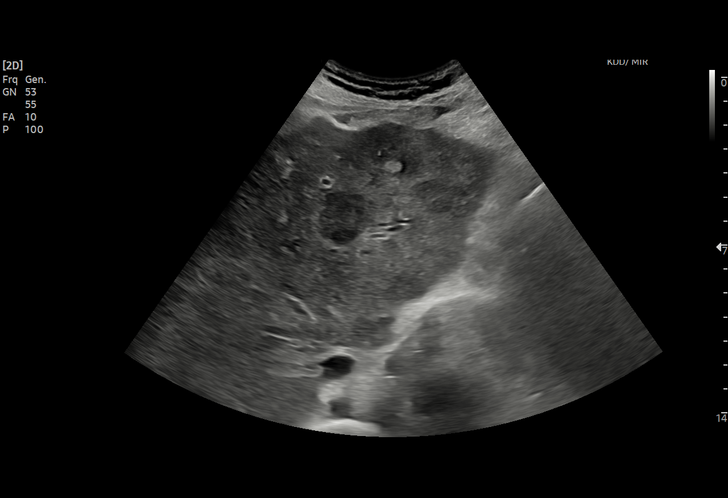
[im 13/35]
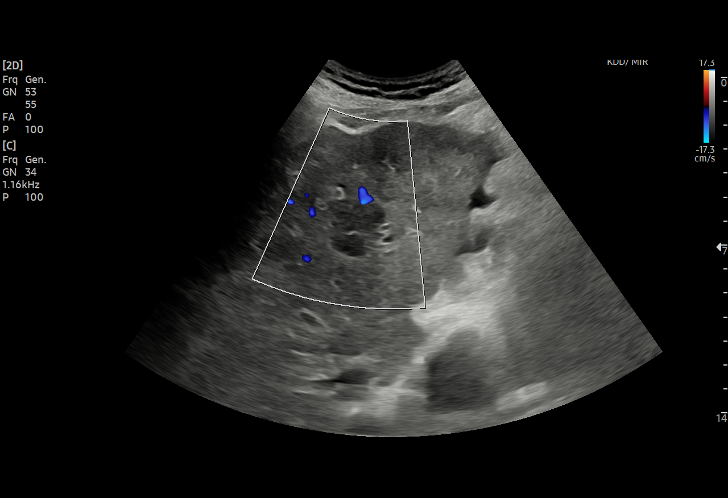
[im 15/35]
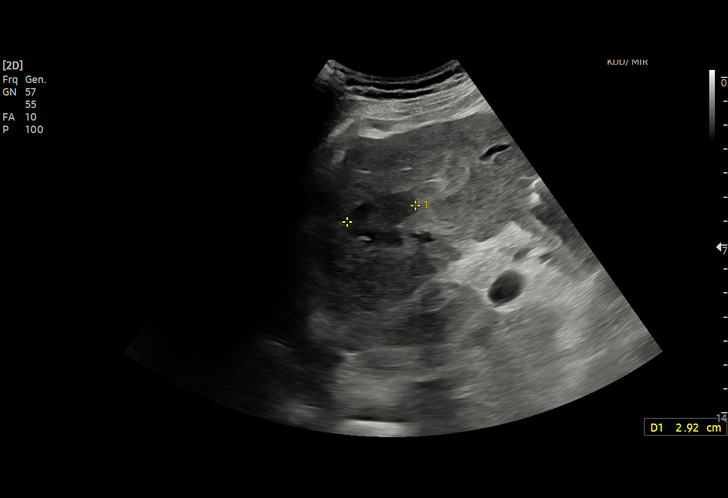
[im 18/35]
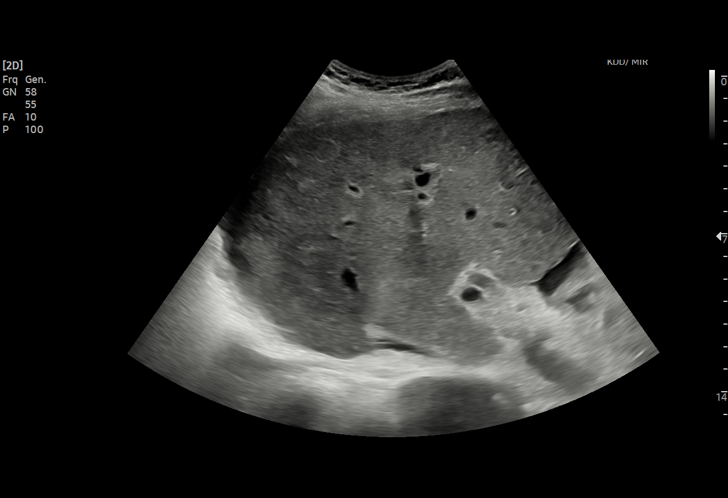
[im 20/35]
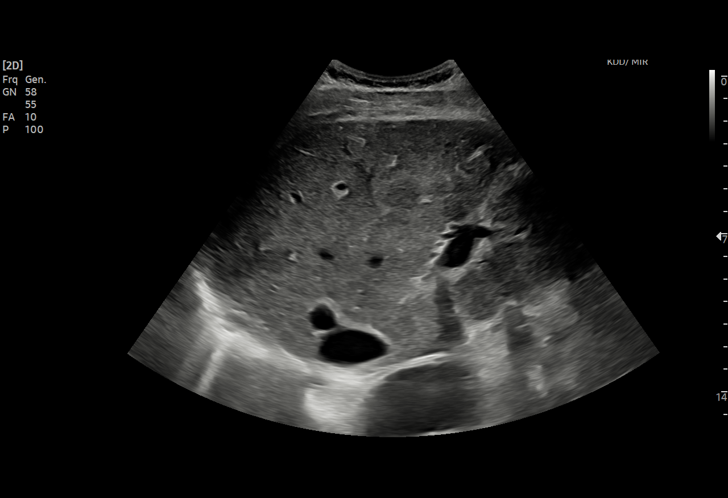
[im 22/35]
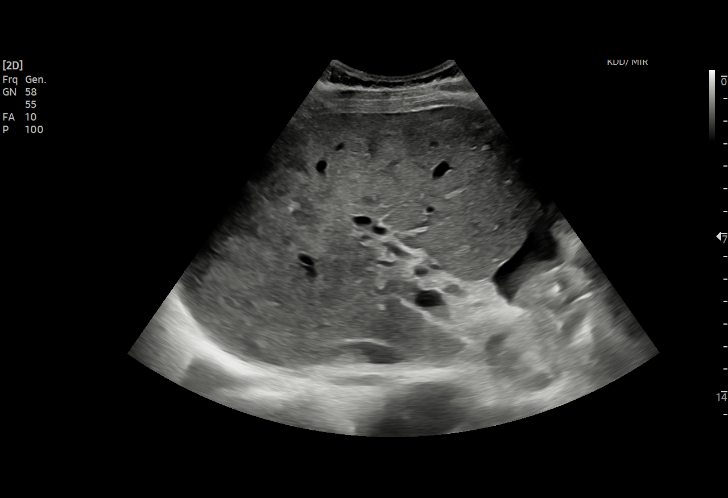
[im 25/35]
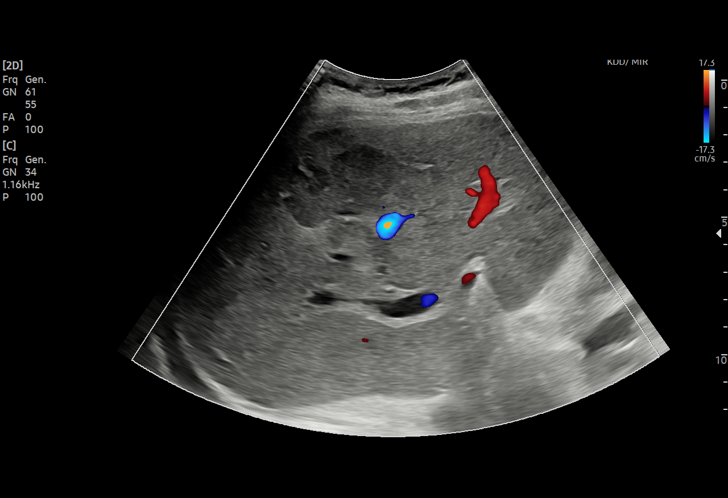
[im 27/35]
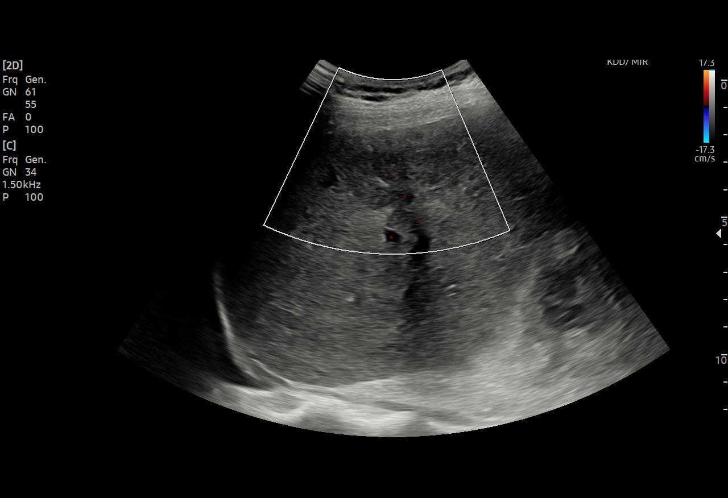
[im 29/35]
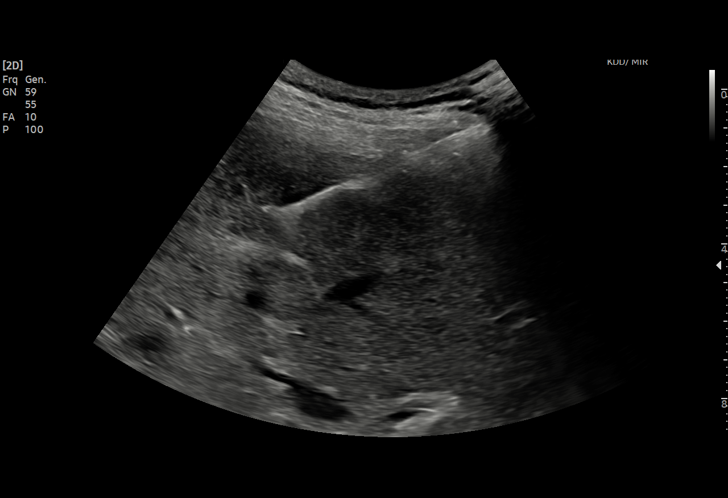
[im 32/35]
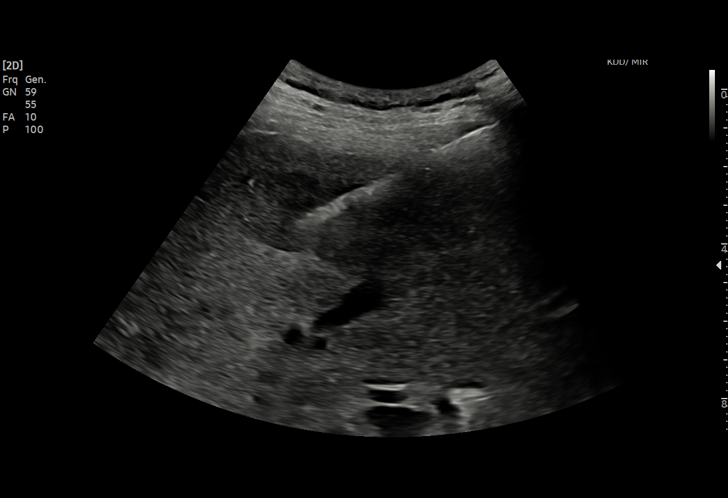
[im 35/35]
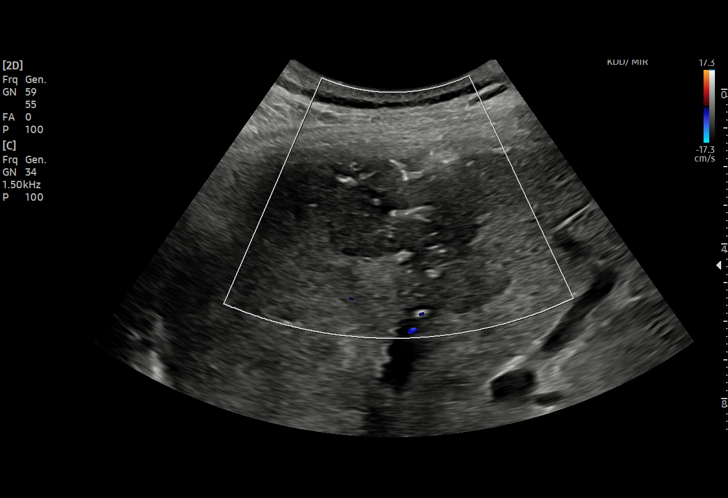

[15 of 25 positions shown; findings below may reference images not displayed]

EXAM:
Ultrasound-guided liver lesion biopsy

MEDICATIONS:
None.

ANESTHESIA/SEDATION:
Moderate (conscious) sedation was employed during this procedure. A
total of Versed 1 mg and Fentanyl 50 mcg was administered
intravenously.

Moderate Sedation Time: The 15 minutes. The patient's level of
consciousness and vital signs were monitored continuously by
radiology nursing throughout the procedure under my direct
supervision.

COMPLICATIONS:
None immediate.

PROCEDURE:
Informed written consent was obtained from the patient after a
thorough discussion of the procedural risks, benefits and
alternatives. All questions were addressed. Maximal Sterile Barrier
Technique was utilized including caps, mask, sterile gowns, sterile
gloves, sterile drape, hand hygiene and skin antiseptic. A timeout
was performed prior to the initiation of the procedure.

Patient position left lateral decubitus on the ultrasound table.

Right upper quadrant skin prepped and draped in usual sterile
fashion.

Following local lidocaine administration, 17 gauge introducer needle
was advanced into 1 of the right liver lesions, and four 18 gauge
cores were obtained utilizing continuous ultrasound guidance.

Gelfoam slurry was administered through the introducer needle at the
biopsy site.

Samples were sent to pathology in formalin.

Needle removed and hemostasis achieved with 5 minutes of manual
compression.

Post procedure ultrasound images showed no evidence of significant
hemorrhage.
IMPRESSION: Ultrasound-guided biopsy of right liver mass.

## 2022-01-08 IMAGING — CR DG ABDOMEN 2V
1 series · 3 of 3 positions shown · non-contrast
Comparison: CT 08/11/2020

CLINICAL DATA: Constipation due to opiate therapy.

EXAM:
ABDOMEN - 2 VIEW

[Series 1: dg abd 2 views · 0.14mm/px · 3 of 3 slices shown]
[im 1/3]
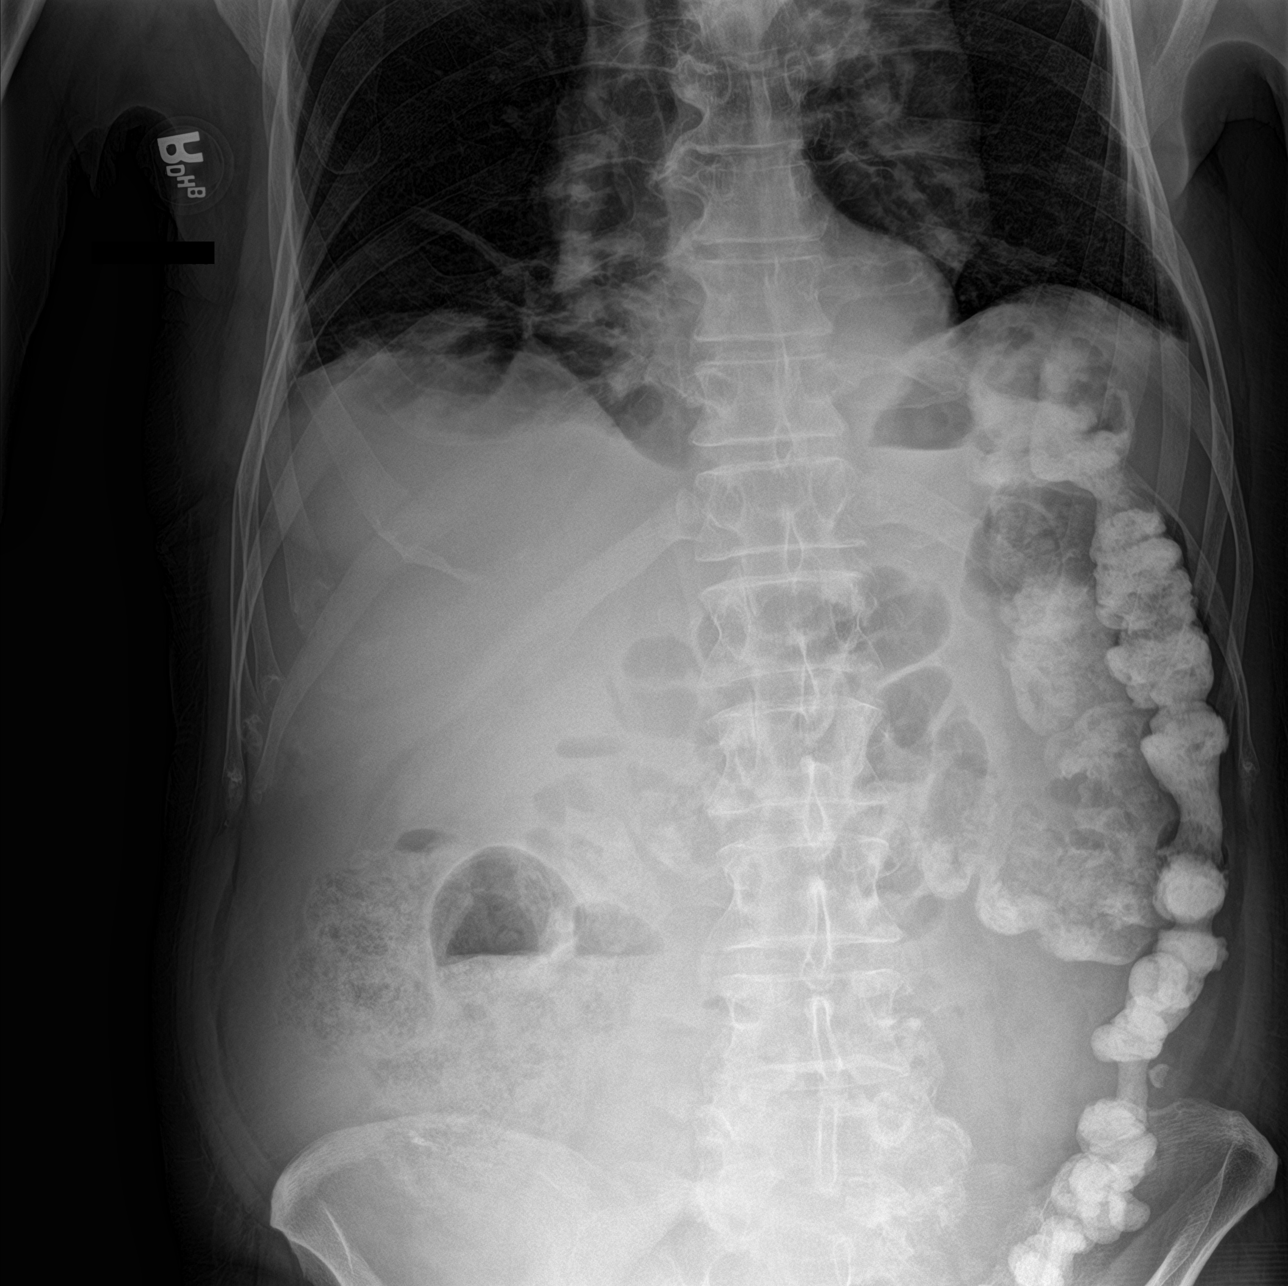
[im 2/3]
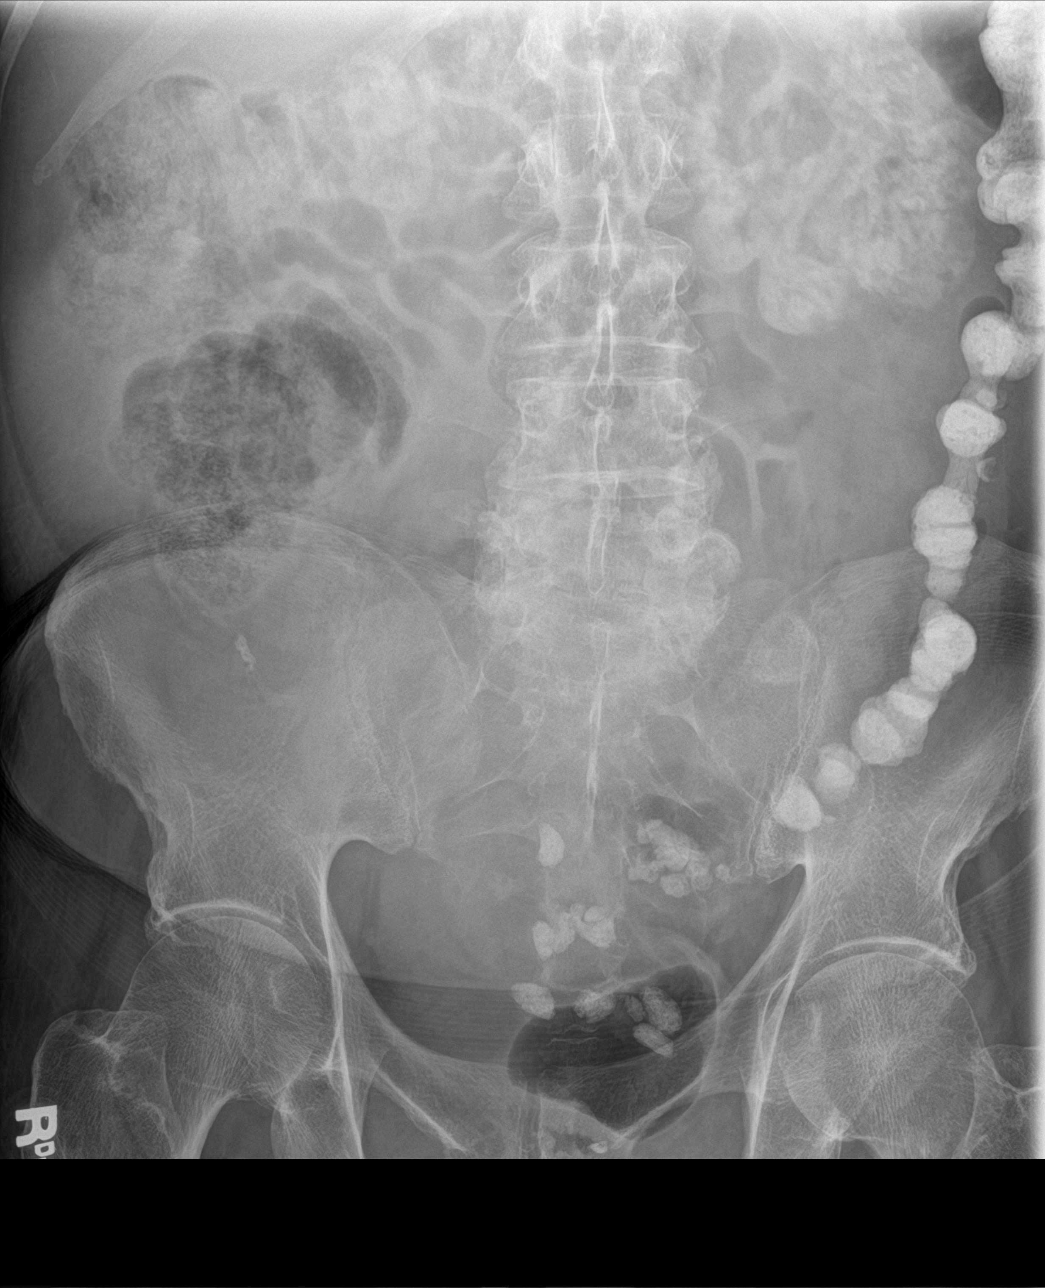
[im 3/3]
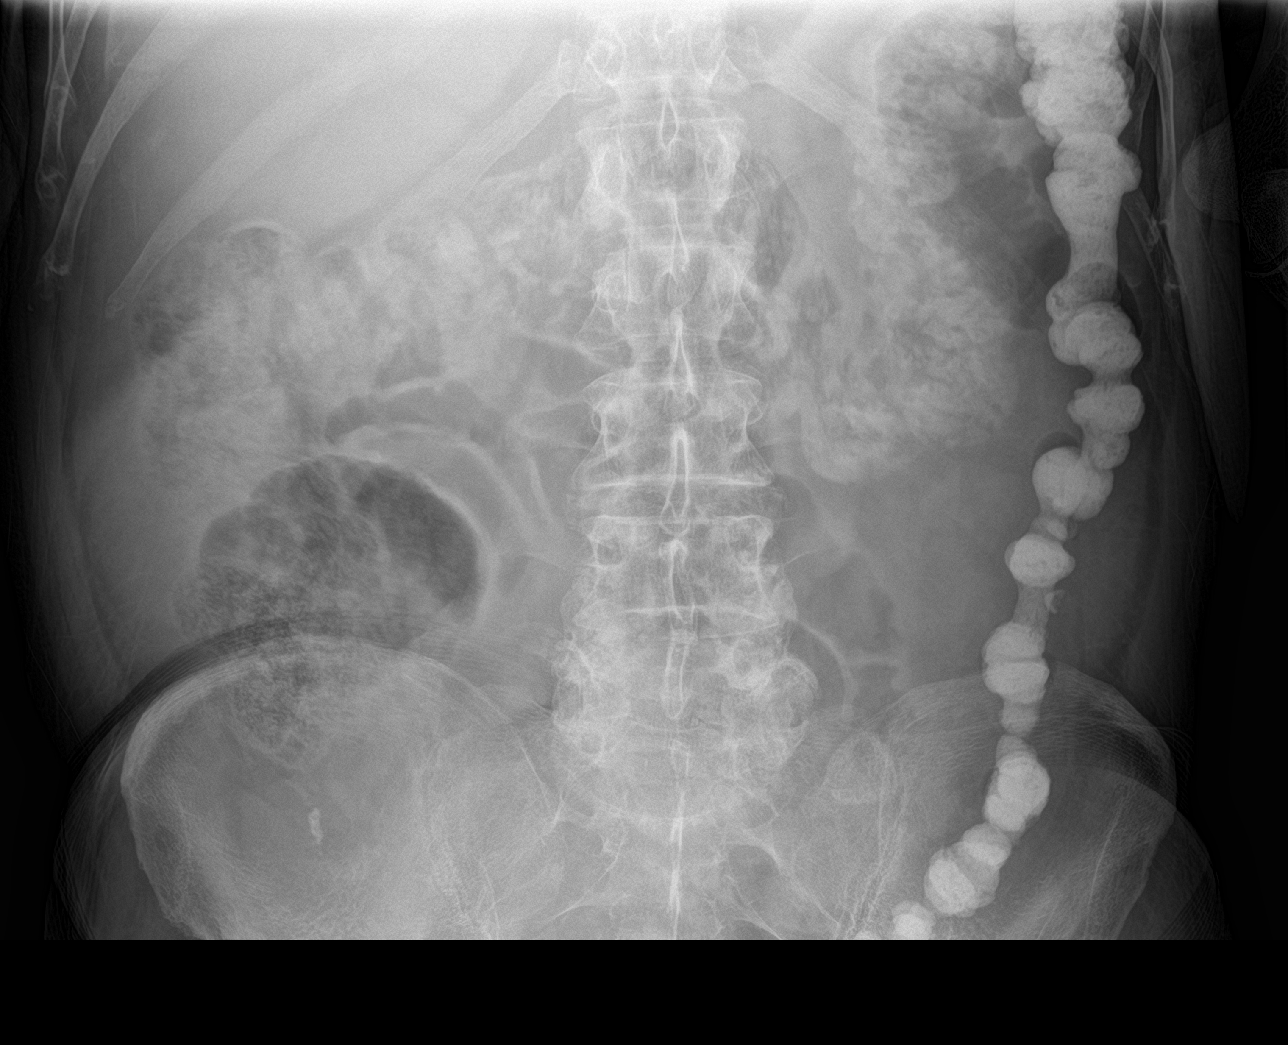

[3 of 3 positions shown; findings below may reference images not displayed]

FINDINGS: No free intra-abdominal air moderate stool in the ascending,
transverse, proximal descending colon. Small volume of stool in the
distal descending and sigmoid colon. Enteric contrast from prior
abdominal CT is present in the left colon. No small bowel dilatation
or evidence of obstruction. Penile prosthesis in place. Linear right
basilar atelectasis.
IMPRESSION: Moderate stool in the colon. No evidence of bowel obstruction.
Previous enteric contrast from prior abdominal CT is present in the
left colon.
